# Patient Record
Sex: Female | Born: 1940 | Race: White | Hispanic: No | Marital: Married | Smoking: Current every day smoker
Health system: Southern US, Community
[De-identification: ages and names within clinical notes are randomized; demographics above are authoritative.]

## PROBLEM LIST (undated history)

## (undated) DIAGNOSIS — I10 Essential (primary) hypertension: Secondary | ICD-10-CM

---

## 2013-05-19 ENCOUNTER — Emergency Department: Payer: Self-pay | Admitting: Emergency Medicine

## 2013-05-19 LAB — CBC
HCT: 44.6 % (ref 35.0–47.0)
HGB: 15 g/dL (ref 12.0–16.0)
MCH: 30 pg (ref 26.0–34.0)
MCHC: 33.7 g/dL (ref 32.0–36.0)
MCV: 89 fL (ref 80–100)
Platelet: 459 10*3/uL — ABNORMAL HIGH (ref 150–440)
RBC: 5.01 10*6/uL (ref 3.80–5.20)
RDW: 13.8 % (ref 11.5–14.5)
WBC: 16.7 10*3/uL — ABNORMAL HIGH (ref 3.6–11.0)

## 2013-05-19 LAB — COMPREHENSIVE METABOLIC PANEL
AST: 21 U/L (ref 15–37)
Albumin: 3.5 g/dL (ref 3.4–5.0)
Alkaline Phosphatase: 117 U/L
Anion Gap: 9 (ref 7–16)
BILIRUBIN TOTAL: 0.4 mg/dL (ref 0.2–1.0)
BUN: 14 mg/dL (ref 7–18)
CALCIUM: 9.3 mg/dL (ref 8.5–10.1)
CREATININE: 0.96 mg/dL (ref 0.60–1.30)
Chloride: 96 mmol/L — ABNORMAL LOW (ref 98–107)
Co2: 29 mmol/L (ref 21–32)
EGFR (African American): >60
EGFR (Non-African Amer.): 60 — ABNORMAL LOW
Glucose: 153 mg/dL — ABNORMAL HIGH (ref 65–99)
OSMOLALITY: 272 (ref 275–301)
Potassium: 2.9 mmol/L — ABNORMAL LOW (ref 3.5–5.1)
SGPT (ALT): 21 U/L (ref 12–78)
Sodium: 134 mmol/L — ABNORMAL LOW (ref 136–145)
TOTAL PROTEIN: 8.2 g/dL (ref 6.4–8.2)

## 2013-05-19 LAB — DIFFERENTIAL
BASOS PCT: 0.9 %
Basophil #: 0.1 10*3/uL (ref 0.0–0.1)
EOS ABS: 0 10*3/uL (ref 0.0–0.7)
Eosinophil %: 0.3 %
LYMPHS PCT: 8.6 %
Lymphocyte #: 1.4 10*3/uL (ref 1.0–3.6)
Monocyte #: 1.3 x10 3/mm — ABNORMAL HIGH (ref 0.2–0.9)
Monocyte %: 7.5 %
NEUTROS ABS: 13.8 10*3/uL — AB (ref 1.4–6.5)
Neutrophil %: 82.7 %

## 2013-05-19 LAB — URINALYSIS, COMPLETE
BILIRUBIN, UR: NEGATIVE
Glucose,UR: NEGATIVE mg/dL (ref 0–75)
Hyaline Cast: 1
KETONE: NEGATIVE
Nitrite: NEGATIVE
Ph: 6 (ref 4.5–8.0)
Protein: 30
RBC,UR: 4 /HPF (ref 0–5)
Specific Gravity: 1.015 (ref 1.003–1.030)
Squamous Epithelial: 5

## 2013-05-19 LAB — TROPONIN I: Troponin-I: <0.02 ng/mL

## 2013-05-21 LAB — URINE CULTURE

## 2019-05-06 ENCOUNTER — Emergency Department: Payer: Medicare PPO

## 2019-05-06 ENCOUNTER — Other Ambulatory Visit: Payer: Self-pay

## 2019-05-06 ENCOUNTER — Inpatient Hospital Stay
Admission: EM | Admit: 2019-05-06 | Discharge: 2019-05-11 | DRG: 481 | Disposition: A | Discharge status: Skilled Nursing Facility | Payer: Medicare PPO | Attending: Internal Medicine | Admitting: Internal Medicine

## 2019-05-06 DIAGNOSIS — F17210 Nicotine dependence, cigarettes, uncomplicated: Secondary | ICD-10-CM | POA: Diagnosis present

## 2019-05-06 DIAGNOSIS — J449 Chronic obstructive pulmonary disease, unspecified: Secondary | ICD-10-CM | POA: Diagnosis present

## 2019-05-06 DIAGNOSIS — Z7951 Long term (current) use of inhaled steroids: Secondary | ICD-10-CM | POA: Diagnosis not present

## 2019-05-06 DIAGNOSIS — Z87440 Personal history of urinary (tract) infections: Secondary | ICD-10-CM | POA: Diagnosis not present

## 2019-05-06 DIAGNOSIS — R1310 Dysphagia, unspecified: Secondary | ICD-10-CM | POA: Diagnosis not present

## 2019-05-06 DIAGNOSIS — I712 Thoracic aortic aneurysm, without rupture: Secondary | ICD-10-CM | POA: Diagnosis present

## 2019-05-06 DIAGNOSIS — N179 Acute kidney failure, unspecified: Secondary | ICD-10-CM | POA: Diagnosis present

## 2019-05-06 DIAGNOSIS — I25118 Atherosclerotic heart disease of native coronary artery with other forms of angina pectoris: Secondary | ICD-10-CM | POA: Diagnosis not present

## 2019-05-06 DIAGNOSIS — I9581 Postprocedural hypotension: Secondary | ICD-10-CM | POA: Diagnosis not present

## 2019-05-06 DIAGNOSIS — W19XXXA Unspecified fall, initial encounter: Secondary | ICD-10-CM | POA: Diagnosis not present

## 2019-05-06 DIAGNOSIS — Z7982 Long term (current) use of aspirin: Secondary | ICD-10-CM | POA: Diagnosis not present

## 2019-05-06 DIAGNOSIS — Z9181 History of falling: Secondary | ICD-10-CM

## 2019-05-06 DIAGNOSIS — Z8781 Personal history of (healed) traumatic fracture: Secondary | ICD-10-CM

## 2019-05-06 DIAGNOSIS — Z20822 Contact with and (suspected) exposure to covid-19: Secondary | ICD-10-CM | POA: Diagnosis present

## 2019-05-06 DIAGNOSIS — Z79899 Other long term (current) drug therapy: Secondary | ICD-10-CM

## 2019-05-06 DIAGNOSIS — D62 Acute posthemorrhagic anemia: Secondary | ICD-10-CM | POA: Diagnosis not present

## 2019-05-06 DIAGNOSIS — Z9981 Dependence on supplemental oxygen: Secondary | ICD-10-CM | POA: Diagnosis not present

## 2019-05-06 DIAGNOSIS — S7221XD Displaced subtrochanteric fracture of right femur, subsequent encounter for closed fracture with routine healing: Secondary | ICD-10-CM | POA: Diagnosis not present

## 2019-05-06 DIAGNOSIS — K219 Gastro-esophageal reflux disease without esophagitis: Secondary | ICD-10-CM | POA: Diagnosis present

## 2019-05-06 DIAGNOSIS — I714 Abdominal aortic aneurysm, without rupture: Secondary | ICD-10-CM | POA: Diagnosis present

## 2019-05-06 DIAGNOSIS — I7123 Aneurysm of the descending thoracic aorta, without rupture: Secondary | ICD-10-CM

## 2019-05-06 DIAGNOSIS — S72001A Fracture of unspecified part of neck of right femur, initial encounter for closed fracture: Secondary | ICD-10-CM | POA: Diagnosis not present

## 2019-05-06 DIAGNOSIS — R1314 Dysphagia, pharyngoesophageal phase: Secondary | ICD-10-CM | POA: Diagnosis present

## 2019-05-06 DIAGNOSIS — R1319 Other dysphagia: Secondary | ICD-10-CM

## 2019-05-06 DIAGNOSIS — I10 Essential (primary) hypertension: Secondary | ICD-10-CM | POA: Diagnosis present

## 2019-05-06 DIAGNOSIS — Z419 Encounter for procedure for purposes other than remedying health state, unspecified: Secondary | ICD-10-CM

## 2019-05-06 DIAGNOSIS — Z01811 Encounter for preprocedural respiratory examination: Secondary | ICD-10-CM | POA: Diagnosis not present

## 2019-05-06 DIAGNOSIS — M25551 Pain in right hip: Secondary | ICD-10-CM | POA: Diagnosis present

## 2019-05-06 DIAGNOSIS — Z720 Tobacco use: Secondary | ICD-10-CM | POA: Diagnosis not present

## 2019-05-06 DIAGNOSIS — I77810 Thoracic aortic ectasia: Secondary | ICD-10-CM | POA: Diagnosis not present

## 2019-05-06 DIAGNOSIS — S7221XA Displaced subtrochanteric fracture of right femur, initial encounter for closed fracture: Secondary | ICD-10-CM | POA: Diagnosis present

## 2019-05-06 DIAGNOSIS — R079 Chest pain, unspecified: Secondary | ICD-10-CM | POA: Diagnosis not present

## 2019-05-06 DIAGNOSIS — W010XXA Fall on same level from slipping, tripping and stumbling without subsequent striking against object, initial encounter: Secondary | ICD-10-CM | POA: Diagnosis present

## 2019-05-06 DIAGNOSIS — Y92002 Bathroom of unspecified non-institutional (private) residence single-family (private) house as the place of occurrence of the external cause: Secondary | ICD-10-CM

## 2019-05-06 DIAGNOSIS — J432 Centrilobular emphysema: Secondary | ICD-10-CM | POA: Diagnosis not present

## 2019-05-06 DIAGNOSIS — D72829 Elevated white blood cell count, unspecified: Secondary | ICD-10-CM | POA: Diagnosis present

## 2019-05-06 DIAGNOSIS — E876 Hypokalemia: Secondary | ICD-10-CM | POA: Diagnosis present

## 2019-05-06 HISTORY — DX: Essential (primary) hypertension: I10

## 2019-05-06 LAB — BASIC METABOLIC PANEL
Anion gap: 15 (ref 5–15)
BUN: 15 mg/dL (ref 8–23)
CO2: 24 mmol/L (ref 22–32)
Calcium: 9.8 mg/dL (ref 8.9–10.3)
Chloride: 99 mmol/L (ref 98–111)
Creatinine, Ser: 1.18 mg/dL — ABNORMAL HIGH (ref 0.44–1.00)
GFR calc Af Amer: 52 mL/min — ABNORMAL LOW (ref >60)
GFR calc non Af Amer: 45 mL/min — ABNORMAL LOW (ref >60)
Glucose, Bld: 136 mg/dL — ABNORMAL HIGH (ref 70–99)
Potassium: 3.3 mmol/L — ABNORMAL LOW (ref 3.5–5.1)
Sodium: 138 mmol/L (ref 135–145)

## 2019-05-06 LAB — CBC WITH DIFFERENTIAL/PLATELET
Abs Immature Granulocytes: 0.2 10*3/uL — ABNORMAL HIGH (ref 0.00–0.07)
Basophils Absolute: 0.1 10*3/uL (ref 0.0–0.1)
Basophils Relative: 1 %
Eosinophils Absolute: 0.1 10*3/uL (ref 0.0–0.5)
Eosinophils Relative: 1 %
HCT: 36.5 % (ref 36.0–46.0)
Hemoglobin: 12.3 g/dL (ref 12.0–15.0)
Immature Granulocytes: 2 %
Lymphocytes Relative: 10 %
Lymphs Abs: 1.4 10*3/uL (ref 0.7–4.0)
MCH: 30.1 pg (ref 26.0–34.0)
MCHC: 33.7 g/dL (ref 30.0–36.0)
MCV: 89.2 fL (ref 80.0–100.0)
Monocytes Absolute: 1 10*3/uL (ref 0.1–1.0)
Monocytes Relative: 8 %
Neutro Abs: 10.3 10*3/uL — ABNORMAL HIGH (ref 1.7–7.7)
Neutrophils Relative %: 78 %
Platelets: 452 10*3/uL — ABNORMAL HIGH (ref 150–400)
RBC: 4.09 MIL/uL (ref 3.87–5.11)
RDW: 13.3 % (ref 11.5–15.5)
WBC: 13 10*3/uL — ABNORMAL HIGH (ref 4.0–10.5)
nRBC: 0 % (ref 0.0–0.2)

## 2019-05-06 LAB — RESPIRATORY PANEL BY RT PCR (FLU A&B, COVID)
Influenza A by PCR: NEGATIVE
Influenza B by PCR: NEGATIVE
SARS Coronavirus 2 by RT PCR: NEGATIVE

## 2019-05-06 MED ORDER — ALBUTEROL SULFATE HFA 108 (90 BASE) MCG/ACT IN AERS
2.0000 | INHALATION_SPRAY | Freq: Four times a day (QID) | RESPIRATORY_TRACT | Status: DC | PRN
  Filled 2019-05-06: qty 6.7

## 2019-05-06 MED ORDER — B COMPLEX-C PO TABS
1.0000 | ORAL_TABLET | Freq: Every day | ORAL | Status: DC
  Administered 2019-05-08 – 2019-05-11 (×4): 1 via ORAL
  Filled 2019-05-06 (×5): qty 1

## 2019-05-06 MED ORDER — HYDROMORPHONE HCL 1 MG/ML IJ SOLN
1.0000 mg | Freq: Once | INTRAMUSCULAR | Status: AC
  Administered 2019-05-06: 1 mg via INTRAVENOUS
  Filled 2019-05-06: qty 1

## 2019-05-06 MED ORDER — NITROFURANTOIN MACROCRYSTAL 50 MG PO CAPS
50.0000 mg | ORAL_CAPSULE | Freq: Every day | ORAL | Status: DC
  Administered 2019-05-07: 50 mg via ORAL
  Filled 2019-05-06: qty 1

## 2019-05-06 MED ORDER — PROPRANOLOL HCL 20 MG PO TABS
40.0000 mg | ORAL_TABLET | Freq: Three times a day (TID) | ORAL | Status: DC
  Administered 2019-05-07 – 2019-05-08 (×3): 40 mg via ORAL
  Filled 2019-05-06 (×4): qty 2

## 2019-05-06 MED ORDER — ONDANSETRON HCL 4 MG PO TABS: 4.0000 mg | ORAL_TABLET | Freq: Four times a day (QID) | ORAL | Status: DC | PRN

## 2019-05-06 MED ORDER — MOMETASONE FURO-FORMOTEROL FUM 200-5 MCG/ACT IN AERO
2.0000 | INHALATION_SPRAY | Freq: Two times a day (BID) | RESPIRATORY_TRACT | Status: DC
  Administered 2019-05-07 – 2019-05-11 (×8): 2 via RESPIRATORY_TRACT
  Filled 2019-05-06: qty 8.8

## 2019-05-06 MED ORDER — ONDANSETRON HCL 4 MG/2ML IJ SOLN: 4.0000 mg | Freq: Four times a day (QID) | INTRAMUSCULAR | Status: DC | PRN

## 2019-05-06 MED ORDER — ONDANSETRON HCL 4 MG/2ML IJ SOLN
4.0000 mg | Freq: Once | INTRAMUSCULAR | Status: AC
  Administered 2019-05-06: 4 mg via INTRAVENOUS
  Filled 2019-05-06: qty 2

## 2019-05-06 MED ORDER — ROSUVASTATIN CALCIUM 10 MG PO TABS
10.0000 mg | ORAL_TABLET | Freq: Every day | ORAL | Status: DC
  Administered 2019-05-07 – 2019-05-10 (×4): 10 mg via ORAL
  Filled 2019-05-06 (×4): qty 1

## 2019-05-06 MED ORDER — MORPHINE SULFATE (PF) 4 MG/ML IV SOLN
4.0000 mg | Freq: Once | INTRAVENOUS | Status: AC
  Administered 2019-05-06: 4 mg via INTRAVENOUS
  Filled 2019-05-06: qty 1

## 2019-05-06 MED ORDER — AMLODIPINE BESYLATE 5 MG PO TABS
5.0000 mg | ORAL_TABLET | Freq: Every day | ORAL | Status: DC
  Filled 2019-05-06: qty 1

## 2019-05-06 MED ORDER — CALCIUM CARBONATE-VITAMIN D 500-200 MG-UNIT PO TABS
1.0000 | ORAL_TABLET | Freq: Every day | ORAL | Status: DC
  Administered 2019-05-08 – 2019-05-11 (×4): 1 via ORAL
  Filled 2019-05-06 (×4): qty 1

## 2019-05-06 MED ORDER — SENNOSIDES-DOCUSATE SODIUM 8.6-50 MG PO TABS: 1.0000 | ORAL_TABLET | Freq: Every evening | ORAL | Status: DC | PRN

## 2019-05-06 MED ORDER — CALCIUM ACETATE (PHOS BINDER) 667 MG/5ML PO SOLN
667.0000 mg | Freq: Two times a day (BID) | ORAL | Status: DC
  Administered 2019-05-07 – 2019-05-11 (×8): 667 mg via ORAL
  Filled 2019-05-06 (×11): qty 5

## 2019-05-06 MED ORDER — MORPHINE SULFATE (PF) 2 MG/ML IV SOLN
0.5000 mg | INTRAVENOUS | Status: DC | PRN
  Administered 2019-05-07 (×2): 0.5 mg via INTRAVENOUS
  Filled 2019-05-06 (×3): qty 1

## 2019-05-06 MED ORDER — CLINDAMYCIN PHOSPHATE 600 MG/50ML IV SOLN
600.0000 mg | Freq: Once | INTRAVENOUS | Status: AC
  Administered 2019-05-07: 600 mg via INTRAVENOUS
  Filled 2019-05-06 (×2): qty 50

## 2019-05-06 MED ORDER — HYDROMORPHONE HCL 1 MG/ML IJ SOLN
1.0000 mg | Freq: Once | INTRAMUSCULAR | Status: AC
  Administered 2019-05-06: 22:00:00 1 mg via INTRAVENOUS
  Filled 2019-05-06: qty 1

## 2019-05-06 MED ORDER — ASPIRIN EC 81 MG PO TBEC
81.0000 mg | DELAYED_RELEASE_TABLET | Freq: Every day | ORAL | Status: DC
  Administered 2019-05-07 – 2019-05-10 (×4): 81 mg via ORAL
  Filled 2019-05-06 (×4): qty 1

## 2019-05-06 MED ORDER — NICOTINE 14 MG/24HR TD PT24
14.0000 mg | MEDICATED_PATCH | Freq: Every day | TRANSDERMAL | Status: DC
  Filled 2019-05-06 (×6): qty 1

## 2019-05-06 MED ORDER — TIOTROPIUM BROMIDE MONOHYDRATE 18 MCG IN CAPS
1.0000 | ORAL_CAPSULE | Freq: Every day | RESPIRATORY_TRACT | Status: DC
  Administered 2019-05-08 – 2019-05-10 (×3): 18 ug via RESPIRATORY_TRACT
  Filled 2019-05-06 (×2): qty 5

## 2019-05-06 MED ORDER — HYDROCODONE-ACETAMINOPHEN 5-325 MG PO TABS
1.0000 | ORAL_TABLET | Freq: Four times a day (QID) | ORAL | Status: DC | PRN
  Administered 2019-05-07: 04:00:00 1 via ORAL
  Administered 2019-05-08: 22:00:00 2 via ORAL
  Administered 2019-05-09: 1 via ORAL
  Administered 2019-05-10: 2 via ORAL
  Administered 2019-05-10: 1 via ORAL
  Administered 2019-05-10: 14:00:00 2 via ORAL
  Administered 2019-05-11: 1 via ORAL
  Filled 2019-05-06 (×2): qty 2
  Filled 2019-05-06: qty 1
  Filled 2019-05-06: qty 2
  Filled 2019-05-06: qty 1
  Filled 2019-05-06: qty 2
  Filled 2019-05-06: qty 1

## 2019-05-06 MED ORDER — PANTOPRAZOLE SODIUM 40 MG PO TBEC
40.0000 mg | DELAYED_RELEASE_TABLET | Freq: Every day | ORAL | Status: DC
  Administered 2019-05-08 – 2019-05-11 (×4): 40 mg via ORAL
  Filled 2019-05-06 (×4): qty 1

## 2019-05-06 MED ORDER — VITAMIN D 25 MCG (1000 UNIT) PO TABS
1000.0000 [IU] | ORAL_TABLET | Freq: Every day | ORAL | Status: DC
  Administered 2019-05-08 – 2019-05-11 (×4): 1000 [IU] via ORAL
  Filled 2019-05-06 (×3): qty 1

## 2019-05-06 NOTE — ED Notes (Signed)
Pt taken to xray 

## 2019-05-06 NOTE — ED Notes (Signed)
Pt placed on 2L Falls due to O2 saturations dropping to 83% on room air with good wave form. Pt now satting at 100%.

## 2019-05-06 NOTE — ED Notes (Signed)
Katie, rn called to notify that pt's sister wishes to speak with MD.

## 2019-05-06 NOTE — ED Notes (Signed)
Pt's sister up to front desk, states pt called her and wants to be transferred to [facility]. Pt already has a visitor in room, her husband. Number taken for pt's sister and given to dr. Derrill Kay. Dr. Derrill Kay informed that pt's sister stated she wants to be transferred to [facility].

## 2019-05-06 NOTE — ED Provider Notes (Addendum)
[facility] Emergency Department Provider Note   ____________________________________________   I have reviewed the triage vital signs and the nursing notes.   HISTORY  Chief Complaint Fall and Hip Pain   History limited by: Not Limited   HPI Holly Shelton is a 79 y.o. female who presents to the emergency department today because of concerns for right hip pain after a fall.  The patient states that she was going to the bathroom and her leg went out.  When she at the ground she felt a pop and immediate onset of pain to the right hip.  She denies any in her head or any pain to her other extremities.  She states that the pain is severe and it radiates down her leg.  She denies any numbness to that leg.   Records reviewed. Per medical record review patient has a history of HTN.  Past Medical History:  Diagnosis Date  . Hypertension     There are no problems to display for this patient.   History reviewed. No pertinent surgical history.  Prior to Admission medications   Medication Sig Start Date End Date Taking? Authorizing Provider  albuterol (VENTOLIN HFA) 108 (90 Base) MCG/ACT inhaler Inhale 2 puffs into the lungs every 6 (six) hours as needed.  04/10/19  Yes [provider]  amLODipine (NORVASC) 5 MG tablet Take 5 mg by mouth daily. 04/25/19  Yes [provider]  aspirin EC 81 MG tablet Take 81 mg by mouth at bedtime.   Yes [provider]  B Complex-C (B-COMPLEX WITH VITAMIN C) tablet Take 1 tablet by mouth daily.   Yes [provider]  Calcium 600-200 MG-UNIT tablet Take 1 tablet by mouth daily.   Yes [provider]  calcium acetate, Phos Binder, (PHOSLYRA) 667 MG/5ML SOLN Take 667 mg by mouth in the morning and at bedtime.   Yes [provider]  cholecalciferol (VITAMIN D3) 25 MCG (1000 UNIT) tablet Take 1,000 Units by mouth daily.   Yes [provider]  gabapentin (NEURONTIN) 600 MG  tablet Take 600 mg by mouth See admin instructions. Take 600 mg at 8:00 am, 1200 mg at 2:00, 600 mg at 7:00 pm, 1200 mg at 2:00 am   Yes [provider]  loratadine (CLARITIN) 10 MG tablet Take 10 mg by mouth daily.   Yes [provider]  nitrofurantoin (MACRODANTIN) 50 MG capsule Take 50 mg by mouth at bedtime.  02/13/19  Yes [provider]  omeprazole (PRILOSEC) 40 MG capsule Take 40 mg by mouth daily. 04/12/19  Yes [provider]  oxybutynin (DITROPAN) 5 MG tablet Take 5 mg by mouth 3 (three) times daily. 04/10/19  Yes [provider]  pregabalin (LYRICA) 100 MG capsule Take 100 mg by mouth 3 (three) times daily. 04/25/19  Yes [provider]  propranolol (INDERAL) 40 MG tablet Take 40 mg by mouth 3 (three) times daily. 03/22/19  Yes [provider]  rosuvastatin (CRESTOR) 10 MG tablet Take 10 mg by mouth at bedtime. 04/12/19  Yes [provider]  SPIRIVA HANDIHALER 18 MCG inhalation capsule Place 1 capsule into inhaler and inhale daily. 04/25/19  Yes [provider]  Monte Fantasia INHUB 500-50 MCG/DOSE AEPB Inhale 1 puff into the lungs 2 (two) times daily. 05/03/19  Yes [provider]    Allergies Penicillins  History reviewed. No pertinent family history.  Social History Social History   Tobacco Use  . Smoking status: Current Every Day Smoker  .  Smokeless tobacco: Never Used  Substance Use Topics  . Alcohol use: Never  . Drug use: Never    Review of Systems Constitutional: No fever/chills Eyes: No visual changes. ENT: No sore throat. Cardiovascular: Denies chest pain. Respiratory: Denies shortness of breath. Gastrointestinal: No abdominal pain.  No nausea, no vomiting.  No diarrhea.   Genitourinary: Negative for dysuria. Musculoskeletal: Positive for right hip pain. Skin: Negative for rash. Neurological: Negative for headaches, focal weakness or  numbness.  ____________________________________________   PHYSICAL EXAM:  VITAL SIGNS: ED Triage Vitals  Enc Vitals Group     BP 05/06/19 1800 (!) 131/99     Pulse Rate 05/06/19 1800 92     Resp 05/06/19 1807 18     Temp 05/06/19 1807 98 F (36.7 C)     Temp Source 05/06/19 1807 Oral     SpO2 05/06/19 1800 96 %     Weight 05/06/19 1806 147 lb 4.3 oz (66.8 kg)     Height 05/06/19 1806 5\' 3"  (1.6 m)     Head Circumference --      Peak Flow --      Pain Score 05/06/19 1803 10   Constitutional: Alert and oriented.  Eyes: Conjunctivae are normal.  ENT      Head: Normocephalic and atraumatic.      Nose: No congestion/rhinnorhea.      Mouth/Throat: Mucous membranes are moist.      Neck: No stridor. Hematological/Lymphatic/Immunilogical: No cervical lymphadenopathy. Cardiovascular: Normal rate, regular rhythm.  No murmurs, rubs, or gallops. Respiratory: Normal respiratory effort without tachypnea nor retractions. Breath sounds are clear and equal bilaterally. No wheezes/rales/rhonchi. Gastrointestinal: Soft and non tender. No rebound. No guarding.  Genitourinary: Deferred Musculoskeletal: Right leg shortened, internally rotated. DP 2+ bilaterally. Right hip tender to palpation and manipulation. Neurologic:  Normal speech and language. No gross focal neurologic deficits are appreciated.  Skin:  Skin is warm, dry and intact. No rash noted. Psychiatric: Mood and affect are normal. Speech and behavior are normal. Patient exhibits appropriate insight and judgment.  ____________________________________________    LABS (pertinent positives/negatives)  CBC wbc 13.0, hgb 12.3, plt 452 BMP na 138, k 3.3, glu 136, cr 1.18  ____________________________________________    RADIOLOGY  Right hip Acute subtrochanteric fracture  CXR No acute  abnormality  ____________________________________________   PROCEDURES  Procedures  ____________________________________________   INITIAL IMPRESSION / ASSESSMENT AND PLAN / ED COURSE  Pertinent labs & imaging results that were available during my care of the patient were reviewed by me and considered in my medical decision making (see chart for details).   Patient presented to the emergency department today with concerns for right hip pain after a fall.  X-rays consistent with a fracture.  Discussed finding with patient need for admission and surgery.  Discussed with Dr. Rudene Christians with orthopedic surgery. Will plan on admission to the hospitalist service  After the patient had been admitted to the hospitalist service here I heard from nursing staff that she wanted to be transferred to [facility]. When I went to go talk to her she mentioned that [facility] had her records. I did discuss that our electronic medical record can access [facility] record. At this point patient and husband stated they felt comfortable staying at our facility.  ____________________________________________   FINAL CLINICAL IMPRESSION(S) / ED DIAGNOSES  Right hip fracture  Note: This dictation was prepared with Dragon dictation. Any transcriptional errors that result from this process are unintentional     Nance Pear, MD  05/06/19 2104    Phineas Semen, MD 05/06/19 2243

## 2019-05-06 NOTE — H&P (Signed)
History and Physical    Holly Shelton EQA:[id number] DOB: 30-Jul-1940 DOA: 05/06/2019  PCP: Patient, No Pcp Per   Patient coming from: home I have personally briefly reviewed patient's old medical records in [facility]  Chief Complaint: fall  HPI: Holly Shelton is a 79 y.o. female with medical history significant for HTN, COPD, GERD, AAA thoracic descending aorta, recurrent UTIs on nitrofurantoin and tobacco use who presents to the emergency room with right hip pain following a fall.  Fall appears to be mechanical in that she was walking to the bathroom on her right leg gave out, she fell onto the right hip feeling a pop with immediate onset of severe pain.  Denies associated numbness or color change in the leg.  She denies preceding chest pain, shortness of breath, headache, blurred vision, unilateral numbness tingling or weakness.  ED Course: X-ray of the hip showed transverse displaced and impacted right subtrochanteric fracture.  Blood work mostly unremarkable except for creatinine of 1.18 but no recent creatinine on file.  Normal white cell count.  Urinalysis pending.  EKG pending.  Chest x-ray with no acute findings.  The ER provider spoke with Dr. Trilby Drummer who will put patient on the schedule for tomorrow.  Review of Systems: As per HPI otherwise 10 point review of systems negative.    Past Medical History:  Diagnosis Date  . Hypertension     History reviewed. No pertinent surgical history.   reports that she has been smoking. She has never used smokeless tobacco. She reports that she does not drink alcohol or use drugs.  Allergies  Allergen Reactions  . Penicillins Rash    History reviewed. No pertinent family history.   Prior to Admission medications   Medication Sig Start Date End Date Taking? Authorizing Provider  albuterol (VENTOLIN HFA) 108 (90 Base) MCG/ACT inhaler Inhale 2 puffs into the lungs every 6 (six) hours as needed.  04/10/19  Yes [provider]    amLODipine (NORVASC) 5 MG tablet Take 5 mg by mouth daily. 04/25/19  Yes [provider]  aspirin EC 81 MG tablet Take 81 mg by mouth at bedtime.   Yes [provider]  B Complex-C (B-COMPLEX WITH VITAMIN C) tablet Take 1 tablet by mouth daily.   Yes [provider]  Calcium 600-200 MG-UNIT tablet Take 1 tablet by mouth daily.   Yes [provider]  calcium acetate, Phos Binder, (PHOSLYRA) 667 MG/5ML SOLN Take 667 mg by mouth in the morning and at bedtime.   Yes [provider]  cholecalciferol (VITAMIN D3) 25 MCG (1000 UNIT) tablet Take 1,000 Units by mouth daily.   Yes [provider]  gabapentin (NEURONTIN) 600 MG tablet Take 600 mg by mouth See admin instructions. Take 600 mg at 8:00 am, 1200 mg at 2:00, 600 mg at 7:00 pm, 1200 mg at 2:00 am   Yes [provider]  loratadine (CLARITIN) 10 MG tablet Take 10 mg by mouth daily.   Yes [provider]  nitrofurantoin (MACRODANTIN) 50 MG capsule Take 50 mg by mouth at bedtime.  02/13/19  Yes [provider]  omeprazole (PRILOSEC) 40 MG capsule Take 40 mg by mouth daily. 04/12/19  Yes [provider]  oxybutynin (DITROPAN) 5 MG tablet Take 5 mg by mouth 3 (three) times daily. 04/10/19  Yes [provider]  pregabalin (LYRICA) 100 MG capsule Take 100 mg by mouth 3 (three) times daily. 04/25/19  Yes [provider]  propranolol (INDERAL) 40  MG tablet Take 40 mg by mouth 3 (three) times daily. 03/22/19  Yes [provider]  rosuvastatin (CRESTOR) 10 MG tablet Take 10 mg by mouth at bedtime. 04/12/19  Yes [provider]  SPIRIVA HANDIHALER 18 MCG inhalation capsule Place 1 capsule into inhaler and inhale daily. 04/25/19  Yes [provider]  Monte Fantasia INHUB 500-50 MCG/DOSE AEPB Inhale 1 puff into the lungs 2 (two) times daily. 05/03/19  Yes [provider]    Physical Exam: Vitals:   05/06/19 1800 05/06/19 1806 05/06/19  1807  BP: (!) 131/99  (!) 131/99  Pulse: 92  95  Resp:   18  Temp:   98 F (36.7 C)  TempSrc:   Oral  SpO2: 96%  96%  Weight:  66.8 kg   Height:  5\' 3"  (1.6 m)      Vitals:   05/06/19 1800 05/06/19 1806 05/06/19 1807  BP: (!) 131/99  (!) 131/99  Pulse: 92  95  Resp:   18  Temp:   98 F (36.7 C)  TempSrc:   Oral  SpO2: 96%  96%  Weight:  66.8 kg   Height:  5\' 3"  (1.6 m)     Constitutional: Alert and awake, oriented x3, in some pain discomfort Eyes: PERLA, EOMI, irises appear normal, anicteric sclera,  ENMT: external ears and nose appear normal, normal hearing             Lips appears normal, oropharynx mucosa, tongue, posterior pharynx appear normal  Neck: neck appears normal, no masses, normal ROM, no thyromegaly, no JVD  CVS: S1-S2 clear, no murmur rubs or gallops,  , no carotid bruits, pedal pulses palpable, No LE edema Respiratory:  clear to auscultation bilaterally, no wheezing, rales or rhonchi. Respiratory effort normal. No accessory muscle use.  Abdomen: soft nontender, nondistended, normal bowel sounds, no hepatosplenomegaly, no hernias Musculoskeletal: : no cyanosis, clubbing , flexion abduction and external rotation of right hip Neuro: Cranial nerves II-XII intact, sensation, reflexes normal, strength Psych: judgement and insight appear normal, stable mood and affect,  Skin: no rashes or lesions or ulcers, no induration or nodules   Labs on Admission: I have personally reviewed following labs and imaging studies  CBC: Recent Labs  Lab 05/06/19 1808  WBC 13.0*  NEUTROABS 10.3*  HGB 12.3  HCT 36.5  MCV 89.2  PLT 452*   Basic Metabolic Panel: Recent Labs  Lab 05/06/19 1808  NA 138  K 3.3*  CL 99  CO2 24  GLUCOSE 136*  BUN 15  CREATININE 1.18*  CALCIUM 9.8   GFR: Estimated Creatinine Clearance: 37.3 mL/min (A) (by C-G formula based on SCr of 1.18 mg/dL (H)). Liver Function Tests: No results for input(s): AST, ALT, ALKPHOS, BILITOT, PROT,  ALBUMIN in the last 168 hours. No results for input(s): LIPASE, AMYLASE in the last 168 hours. No results for input(s): AMMONIA in the last 168 hours. Coagulation Profile: No results for input(s): INR, PROTIME in the last 168 hours. Cardiac Enzymes: No results for input(s): CKTOTAL, CKMB, CKMBINDEX, TROPONINI in the last 168 hours. BNP (last 3 results) No results for input(s): PROBNP in the last 8760 hours. HbA1C: No results for input(s): HGBA1C in the last 72 hours. CBG: No results for input(s): GLUCAP in the last 168 hours. Lipid Profile: No results for input(s): CHOL, HDL, LDLCALC, TRIG, CHOLHDL, LDLDIRECT in the last 72 hours. Thyroid Function Tests: No results for input(s): TSH, T4TOTAL, FREET4, T3FREE, THYROIDAB in the last 72 hours. Anemia Panel:  No results for input(s): VITAMINB12, FOLATE, FERRITIN, TIBC, IRON, RETICCTPCT in the last 72 hours. Urine analysis:    Component Value Date/Time   COLORURINE Amber 05/19/2013 1200   APPEARANCEUR Cloudy 05/19/2013 1200   LABSPEC 1.015 05/19/2013 1200   PHURINE 6.0 05/19/2013 1200   GLUCOSEU Negative 05/19/2013 1200   HGBUR 1+ 05/19/2013 1200   BILIRUBINUR Negative 05/19/2013 1200   KETONESUR Negative 05/19/2013 1200   PROTEINUR 30 mg/dL 05/19/2013 1200   NITRITE Negative 05/19/2013 1200   LEUKOCYTESUR 2+ 05/19/2013 1200    Radiological Exams on Admission: DG Chest 1 View  Result Date: 05/06/2019 CLINICAL DATA:  Recent fall with chest pain, initial encounter EXAM: CHEST  1 VIEW COMPARISON:  05/19/2013 FINDINGS: Cardiac shadow is enlarged. Thoracic aorta is tortuous and accentuated by mild patient rotation. The lungs are clear bilaterally. No acute bony abnormality is seen. IMPRESSION: Cardiomegaly and tortuous aorta.  No acute abnormality is seen. Electronically Signed   By: Inez Catalina M.D.   On: 05/06/2019 19:03   DG Hip Unilat W or Wo Pelvis 2-3 Views Right  Result Date: 05/06/2019 CLINICAL DATA:  Right hip pain post fall.  EXAM: DG HIP (WITH OR WITHOUT PELVIS) 2-3V RIGHT COMPARISON:  None. FINDINGS: There is a transverse severely displaced and impacted subtrochanteric fracture of the proximal right femoral shaft. The right femoral head is normally located within the acetabulum. IMPRESSION: Transverse severely displaced and impacted subtrochanteric fracture of the proximal right femoral shaft. Electronically Signed   By: Fidela Salisbury M.D.   On: 05/06/2019 19:06    EKG: pending  Assessment/Plan Active Problems:   Closed displaced subtrochanteric fracture of right femur (HCC) Preoperative clearance -Mechanical fall resulting in closed displaced and impacted right subtrochanteric fracture -Management per orthopedics -Pain control -Low risk of perioperative cardiopulmonary events pending unremarkable EKG..  No documented history of CAD, CHF, OSA, stroke, syncope, cardiac arrhythmias.  Does have COPD but has fair exercise tolerance.Has a stable AAA. -Okay to proceed with procedure scheduled for more --Husband at bedside says they were once told she should not receive general anesthesia.  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    Tobacco use -Nicotine patch    History of recurrent UTI (urinary tract infection) -Continue home nitrofurantoin    GERD (gastroesophageal reflux disease) -Continue PPI     DVT prophylaxis: SCD  Code Status: full code  Family Communication:  Husband at bedside  Disposition Plan: Back to previous home environment Consults called: ortho, Dr. Rudene Christians  Status:inp    Athena Masse MD [facility]     05/06/2019, 8:03 PM

## 2019-05-06 NOTE — ED Triage Notes (Addendum)
Pt here via [facility] from home after falling. Per EMS pt fell on right hip when ambulating with a cane today, pt states she felt it "pop". Right leg is internally rotated, 2+ pedal pulses.   Pt received fentanyl in route, c/o 9/10 right sided hip pain at this time.   Per pt's family, pt has also been losing weight over the last few months and has not had an appetite. Family states pt hasn't seen a doctor for these issues.  EMS VS- BP 128/74, HR 100, cbg 148, O2 98% RA. Pt A&Ox4.

## 2019-05-06 NOTE — ED Notes (Signed)
Pt's sister back up to desk, states "no one has called me"sister informed that this rn relayed message and her phone number to dr. Derrill Kay.

## 2019-05-07 ENCOUNTER — Encounter
Admission: EM | Disposition: A | Discharge status: Skilled Nursing Facility | Payer: Self-pay | Source: Home / Self Care | Attending: Internal Medicine

## 2019-05-07 ENCOUNTER — Encounter: Payer: Self-pay | Admitting: Internal Medicine

## 2019-05-07 ENCOUNTER — Inpatient Hospital Stay: Payer: Medicare PPO | Admitting: Anesthesiology

## 2019-05-07 ENCOUNTER — Inpatient Hospital Stay: Payer: Medicare PPO

## 2019-05-07 DIAGNOSIS — I714 Abdominal aortic aneurysm, without rupture: Secondary | ICD-10-CM

## 2019-05-07 DIAGNOSIS — I77810 Thoracic aortic ectasia: Secondary | ICD-10-CM

## 2019-05-07 DIAGNOSIS — R079 Chest pain, unspecified: Secondary | ICD-10-CM

## 2019-05-07 DIAGNOSIS — S7221XD Displaced subtrochanteric fracture of right femur, subsequent encounter for closed fracture with routine healing: Secondary | ICD-10-CM

## 2019-05-07 DIAGNOSIS — I25118 Atherosclerotic heart disease of native coronary artery with other forms of angina pectoris: Secondary | ICD-10-CM

## 2019-05-07 DIAGNOSIS — I712 Thoracic aortic aneurysm, without rupture: Secondary | ICD-10-CM

## 2019-05-07 HISTORY — PX: INTRAMEDULLARY (IM) NAIL INTERTROCHANTERIC: SHX5875

## 2019-05-07 LAB — BASIC METABOLIC PANEL
Anion gap: 15 (ref 5–15)
BUN: 19 mg/dL (ref 8–23)
CO2: 26 mmol/L (ref 22–32)
Calcium: 9.3 mg/dL (ref 8.9–10.3)
Chloride: 98 mmol/L (ref 98–111)
Creatinine, Ser: 1.44 mg/dL — ABNORMAL HIGH (ref 0.44–1.00)
GFR calc Af Amer: 41 mL/min — ABNORMAL LOW (ref >60)
GFR calc non Af Amer: 35 mL/min — ABNORMAL LOW (ref >60)
Glucose, Bld: 145 mg/dL — ABNORMAL HIGH (ref 70–99)
Potassium: 3.5 mmol/L (ref 3.5–5.1)
Sodium: 139 mmol/L (ref 135–145)

## 2019-05-07 LAB — CBC
HCT: 37 % (ref 36.0–46.0)
Hemoglobin: 12.2 g/dL (ref 12.0–15.0)
MCH: 30 pg (ref 26.0–34.0)
MCHC: 33 g/dL (ref 30.0–36.0)
MCV: 91.1 fL (ref 80.0–100.0)
Platelets: 409 10*3/uL — ABNORMAL HIGH (ref 150–400)
RBC: 4.06 MIL/uL (ref 3.87–5.11)
RDW: 13.5 % (ref 11.5–15.5)
WBC: 17.5 10*3/uL — ABNORMAL HIGH (ref 4.0–10.5)
nRBC: 0 % (ref 0.0–0.2)

## 2019-05-07 LAB — MRSA PCR SCREENING: MRSA by PCR: NEGATIVE

## 2019-05-07 LAB — TYPE AND SCREEN
ABO/RH(D): O NEG
Antibody Screen: NEGATIVE

## 2019-05-07 LAB — TROPONIN I (HIGH SENSITIVITY)
Troponin I (High Sensitivity): 20 ng/L — ABNORMAL HIGH (ref <18)
Troponin I (High Sensitivity): 21 ng/L — ABNORMAL HIGH (ref <18)

## 2019-05-07 SURGERY — FIXATION, FRACTURE, INTERTROCHANTERIC, WITH INTRAMEDULLARY ROD
Anesthesia: Spinal | Site: Hip | Laterality: Right

## 2019-05-07 MED ORDER — OXYCODONE HCL 5 MG/5ML PO SOLN: 5.0000 mg | Freq: Once | ORAL | Status: DC | PRN

## 2019-05-07 MED ORDER — ENOXAPARIN SODIUM 40 MG/0.4ML ~~LOC~~ SOLN: 40.0000 mg | SUBCUTANEOUS | Status: DC

## 2019-05-07 MED ORDER — ONDANSETRON HCL 4 MG/2ML IJ SOLN: 4.0000 mg | Freq: Four times a day (QID) | INTRAMUSCULAR | Status: DC | PRN

## 2019-05-07 MED ORDER — CHLORHEXIDINE GLUCONATE CLOTH 2 % EX PADS
6.0000 | MEDICATED_PAD | Freq: Every day | CUTANEOUS | Status: DC
  Administered 2019-05-09 – 2019-05-11 (×3): 6 via TOPICAL

## 2019-05-07 MED ORDER — ONDANSETRON HCL 4 MG PO TABS: 4.0000 mg | ORAL_TABLET | Freq: Four times a day (QID) | ORAL | Status: DC | PRN

## 2019-05-07 MED ORDER — METOCLOPRAMIDE HCL 5 MG/ML IJ SOLN: 5.0000 mg | Freq: Three times a day (TID) | INTRAMUSCULAR | Status: DC | PRN

## 2019-05-07 MED ORDER — SODIUM CHLORIDE 0.9 % IV BOLUS
500.0000 mL | Freq: Once | INTRAVENOUS | Status: AC
  Administered 2019-05-07: 20:00:00 500 mL via INTRAVENOUS

## 2019-05-07 MED ORDER — OXYCODONE HCL 5 MG PO TABS: 5.0000 mg | ORAL_TABLET | Freq: Once | ORAL | Status: DC | PRN

## 2019-05-07 MED ORDER — DOCUSATE SODIUM 100 MG PO CAPS
100.0000 mg | ORAL_CAPSULE | Freq: Two times a day (BID) | ORAL | Status: DC
  Administered 2019-05-07 – 2019-05-11 (×7): 100 mg via ORAL
  Filled 2019-05-07 (×8): qty 1

## 2019-05-07 MED ORDER — KETAMINE HCL 10 MG/ML IJ SOLN
INTRAMUSCULAR | Status: DC | PRN
  Administered 2019-05-07: 35 mg via INTRAVENOUS
  Administered 2019-05-07: 40 mg via INTRAVENOUS

## 2019-05-07 MED ORDER — LACTATED RINGERS IV SOLN: INTRAVENOUS | Status: DC | PRN

## 2019-05-07 MED ORDER — MAGNESIUM HYDROXIDE 400 MG/5ML PO SUSP
30.0000 mL | Freq: Every day | ORAL | Status: DC | PRN
  Administered 2019-05-09: 30 mL via ORAL
  Filled 2019-05-07: qty 30

## 2019-05-07 MED ORDER — MORPHINE SULFATE (PF) 2 MG/ML IV SOLN
2.0000 mg | INTRAVENOUS | Status: DC | PRN
  Administered 2019-05-07 (×3): 2 mg via INTRAVENOUS
  Filled 2019-05-07 (×2): qty 1

## 2019-05-07 MED ORDER — PROPOFOL 500 MG/50ML IV EMUL
INTRAVENOUS | Status: AC
  Filled 2019-05-07: qty 50

## 2019-05-07 MED ORDER — ENSURE ENLIVE PO LIQD
237.0000 mL | Freq: Two times a day (BID) | ORAL | Status: DC
  Administered 2019-05-08 – 2019-05-10 (×5): 237 mL via ORAL
  Filled 2019-05-07: qty 237

## 2019-05-07 MED ORDER — ENSURE ENLIVE PO LIQD: 237.0000 mL | Freq: Two times a day (BID) | ORAL | Status: DC

## 2019-05-07 MED ORDER — SODIUM CHLORIDE 0.9 % IV SOLN
INTRAVENOUS | Status: DC | PRN
  Administered 2019-05-07: 50 ug/min via INTRAVENOUS

## 2019-05-07 MED ORDER — PROPOFOL 10 MG/ML IV BOLUS
INTRAVENOUS | Status: AC
  Filled 2019-05-07: qty 20

## 2019-05-07 MED ORDER — NITROGLYCERIN 0.4 MG SL SUBL
SUBLINGUAL_TABLET | SUBLINGUAL | Status: AC
  Administered 2019-05-07: 0.4 mg
  Filled 2019-05-07: qty 1

## 2019-05-07 MED ORDER — SODIUM CHLORIDE 0.9 % IV SOLN: INTRAVENOUS | Status: DC

## 2019-05-07 MED ORDER — PHENYLEPHRINE HCL (PRESSORS) 10 MG/ML IV SOLN
INTRAVENOUS | Status: DC | PRN
  Administered 2019-05-07 (×3): 100 ug via INTRAVENOUS

## 2019-05-07 MED ORDER — ENOXAPARIN SODIUM 30 MG/0.3ML ~~LOC~~ SOLN
30.0000 mg | SUBCUTANEOUS | Status: DC
  Administered 2019-05-08 – 2019-05-10 (×3): 30 mg via SUBCUTANEOUS
  Filled 2019-05-07 (×3): qty 0.3

## 2019-05-07 MED ORDER — FENTANYL CITRATE (PF) 100 MCG/2ML IJ SOLN: 25.0000 ug | INTRAMUSCULAR | Status: DC | PRN

## 2019-05-07 MED ORDER — MAGNESIUM CITRATE PO SOLN: 1.0000 | Freq: Once | ORAL | Status: DC | PRN

## 2019-05-07 MED ORDER — POTASSIUM CHLORIDE CRYS ER 20 MEQ PO TBCR
40.0000 meq | EXTENDED_RELEASE_TABLET | Freq: Once | ORAL | Status: AC
  Administered 2019-05-07: 40 meq via ORAL
  Filled 2019-05-07: qty 2

## 2019-05-07 MED ORDER — ONDANSETRON HCL 4 MG/2ML IJ SOLN
INTRAMUSCULAR | Status: DC | PRN
  Administered 2019-05-07: 4 mg via INTRAVENOUS

## 2019-05-07 MED ORDER — CLINDAMYCIN PHOSPHATE 600 MG/50ML IV SOLN
600.0000 mg | Freq: Four times a day (QID) | INTRAVENOUS | Status: AC
  Administered 2019-05-07 – 2019-05-08 (×3): 600 mg via INTRAVENOUS
  Filled 2019-05-07 (×3): qty 50

## 2019-05-07 MED ORDER — METOCLOPRAMIDE HCL 10 MG PO TABS: 5.0000 mg | ORAL_TABLET | Freq: Three times a day (TID) | ORAL | Status: DC | PRN

## 2019-05-07 MED ORDER — BUPIVACAINE HCL (PF) 0.5 % IJ SOLN
INTRAMUSCULAR | Status: DC | PRN
  Administered 2019-05-07: 2 mL via INTRATHECAL

## 2019-05-07 MED ORDER — EPHEDRINE SULFATE 50 MG/ML IJ SOLN
INTRAMUSCULAR | Status: DC | PRN
  Administered 2019-05-07: 15 mg via INTRAVENOUS

## 2019-05-07 MED ORDER — ALUM & MAG HYDROXIDE-SIMETH 200-200-20 MG/5ML PO SUSP: 30.0000 mL | ORAL | Status: DC | PRN

## 2019-05-07 MED ORDER — PROPOFOL 10 MG/ML IV BOLUS
INTRAVENOUS | Status: DC | PRN
  Administered 2019-05-07: 80 ug/kg/min via INTRAVENOUS

## 2019-05-07 MED ORDER — NEOMYCIN-POLYMYXIN B GU 40-200000 IR SOLN
Status: DC | PRN
  Administered 2019-05-07: 2 mL

## 2019-05-07 MED ORDER — MENTHOL 3 MG MT LOZG: 1.0000 | LOZENGE | OROMUCOSAL | Status: DC | PRN

## 2019-05-07 MED ORDER — KETAMINE HCL 50 MG/ML IJ SOLN
INTRAMUSCULAR | Status: AC
  Filled 2019-05-07: qty 10

## 2019-05-07 MED ORDER — MIDAZOLAM HCL 2 MG/2ML IJ SOLN
INTRAMUSCULAR | Status: AC
  Filled 2019-05-07: qty 2

## 2019-05-07 MED ORDER — BISACODYL 10 MG RE SUPP
10.0000 mg | Freq: Every day | RECTAL | Status: DC | PRN
  Administered 2019-05-10: 10 mg via RECTAL
  Filled 2019-05-07: qty 1

## 2019-05-07 MED ORDER — PHENOL 1.4 % MT LIQD: 1.0000 | OROMUCOSAL | Status: DC | PRN

## 2019-05-07 SURGICAL SUPPLY — 34 items
BIT DRILL 4.3MMS DISTAL GRDTED (BIT) ×1 IMPLANT
CANISTER SUCT 1200ML W/VALVE (MISCELLANEOUS) ×2 IMPLANT
CHLORAPREP W/TINT 26 (MISCELLANEOUS) ×2 IMPLANT
COVER WAND RF STERILE (DRAPES) ×2 IMPLANT
DRAPE 3/4 80X56 (DRAPES) ×2 IMPLANT
DRAPE U-SHAPE 47X51 STRL (DRAPES) ×2 IMPLANT
DRILL 4.3MMS DISTAL GRADUATED (BIT) ×2
DRSG OPSITE POSTOP 3X4 (GAUZE/BANDAGES/DRESSINGS) ×6 IMPLANT
GLOVE BIOGEL PI IND STRL 9 (GLOVE) ×1 IMPLANT
GLOVE BIOGEL PI INDICATOR 9 (GLOVE) ×1
GLOVE SURG SYN 9.0  PF PI (GLOVE) ×1
GLOVE SURG SYN 9.0 PF PI (GLOVE) ×1 IMPLANT
GOWN SRG 2XL LVL 4 RGLN SLV (GOWNS) ×1 IMPLANT
GOWN STRL NON-REIN 2XL LVL4 (GOWNS) ×1
GOWN STRL REUS W/ TWL LRG LVL3 (GOWN DISPOSABLE) ×1 IMPLANT
GOWN STRL REUS W/TWL LRG LVL3 (GOWN DISPOSABLE) ×1
GUIDEPIN VERSANAIL DSP 3.2X444 (ORTHOPEDIC DISPOSABLE SUPPLIES) ×2 IMPLANT
GUIDEWIRE BALL NOSE 100CM (WIRE) ×2 IMPLANT
HFN RH 130 DEG 9MM X 320MM (Nail) ×2 IMPLANT
HIP FRA NAIL LAG SCREW 10.5X90 (Orthopedic Implant) ×2 IMPLANT
KIT TURNOVER KIT A (KITS) ×2 IMPLANT
MAT ABSORB  FLUID 56X50 GRAY (MISCELLANEOUS) ×1
MAT ABSORB FLUID 56X50 GRAY (MISCELLANEOUS) ×1 IMPLANT
NEEDLE FILTER BLUNT 18X 1/2SAF (NEEDLE) ×1
NEEDLE FILTER BLUNT 18X1 1/2 (NEEDLE) ×1 IMPLANT
NS IRRIG 500ML POUR BTL (IV SOLUTION) ×2 IMPLANT
PACK HIP COMPR (MISCELLANEOUS) ×2 IMPLANT
SCALPEL PROTECTED #15 DISP (BLADE) ×4 IMPLANT
SCREW BONE CORTICAL 5.0X42 (Screw) ×2 IMPLANT
SCREW LAG HIP FRA NAIL 10.5X90 (Orthopedic Implant) ×1 IMPLANT
STAPLER SKIN PROX 35W (STAPLE) ×2 IMPLANT
SUT VIC AB 1 CT1 36 (SUTURE) ×2 IMPLANT
SUT VIC AB 2-0 CT1 (SUTURE) ×2 IMPLANT
SYR 10ML LL (SYRINGE) ×2 IMPLANT

## 2019-05-07 NOTE — Progress Notes (Signed)
Pt c/o 10/10 pain on the left lower chest, on 2 liters oxygen. Pt given PRN Morphine IV dose. Pt called again less than 5 minutes after giving morphine, pain is 7/10 but claims it as "steady" pain. EKG done per protocol. Text paged Webb Silversmith, NP and ordered to give Nitroglycerin tab SL. Nitroglycerin 1 tab given SL as ordered. Pt conversant at this time and rated pain as 6/10. Will continue to closely monitor.

## 2019-05-07 NOTE — Progress Notes (Signed)
Initial Nutrition Assessment  DOCUMENTATION CODES:   Obesity unspecified  INTERVENTION:   Ensure Enlive po BID, each supplement provides 350 kcal and 20 grams of protein  NUTRITION DIAGNOSIS:   Increased nutrient needs related to post-op healing, other (see comment)(COPD) as evidenced by increased estimated needs.  GOAL:   Patient will meet greater than or equal to 90% of their needs  MONITOR:   PO intake, Supplement acceptance, Labs, Weight trends, Skin, I & O's  REASON FOR ASSESSMENT:   Consult Assessment of nutrition requirement/status  ASSESSMENT:   79 y.o. female with medical history significant for HTN, COPD, GERD, AAA thoracic descending aorta, recurrent UTIs on nitrofurantoin and tobacco use who presents to the emergency room with right hip pain following a fall.   Unable to speak with patient as pt in surgery at time of RD visit. Pt with increased estimated needs r/t COPD and hip fracture. RD will add supplements to help pt meet her estimated needs. Per chart, pt's UBW appears to be ~190lbs; it appears pt has lost 34lbs(18%) over the past year. RD will obtain nutrition related history and exam at follow-up.    Medications reviewed and include: aspirin, B-complex w/ C, calcium acetate, oscal with D, D3, nicotine, protonix  Labs reviewed: creat 1.44(H) Wbc- 17.5(H)  NUTRITION - FOCUSED PHYSICAL EXAM: Unable to perform at this time   Diet Order:   Diet Order            Diet NPO time specified  Diet effective now             EDUCATION NEEDS:   Not appropriate for education at this time  Skin:  Skin Assessment: Reviewed RN Assessment(ecchymosis, MASD, incision R hip)  Last BM:  PTA  Height:   Ht Readings from Last 1 Encounters:  05/07/19 5' (1.524 m)    Weight:   Wt Readings from Last 1 Encounters:  05/07/19 71 kg    Ideal Body Weight:  45.4 kg  BMI:  Body mass index is 30.57 kg/m.  Estimated Nutritional Needs:   Kcal:   1500-1700kcal/day  Protein:  75-85g/day  Fluid:  >1.2L/day  Betsey Holiday MS, RD, LDN Please refer to [facility] for RD and/or RD on-call/weekend/after hours pager

## 2019-05-07 NOTE — Op Note (Signed)
05/07/2019  12:40 PM  PATIENT:  Holly Shelton  79 y.o. female  PRE-OPERATIVE DIAGNOSIS:  Hip Fx subtrochanteric  POST-OPERATIVE DIAGNOSIS:  hip Fx subtrochanteric  PROCEDURE:  Procedure(s): INTRAMEDULLARY (IM) NAIL INTERTROCHANTRIC (Right) with open reduction internal fixation of subtrochanteric fracture  SURGEON: Leitha Schuller, MD  ASSISTANTS: None  ANESTHESIA:   spinal  EBL:  Total I/O In: 1450 [I.V.:1400; IV Piggyback:50] Out: 250 [Blood:250]  BLOOD ADMINISTERED:none  DRAINS: none   LOCAL MEDICATIONS USED:  NONE  SPECIMEN:  No Specimen  DISPOSITION OF SPECIMEN:  N/A  COUNTS:  YES  TOURNIQUET:  * No tourniquets in log *  IMPLANTS: Biomet affixes 9 x 320 rod with 90 mm leg screw and 42 mm distal interlocking screw  DICTATION: .Dragon Dictation patient was brought to the operating room and after adequate spinal anesthesia was obtained she was placed on the fracture table.  The left leg is in the well-leg holder and traction applied through the traction boot on the right side.  AP lateral imaging showed the proximal fragment was flexed and externally rotated so an open reduction was required.  After prepping and draping using the barrier drape technique appropriate patient identification and timeout procedures were completed.  After checking position of the bone on the C arm a proximal incision was made at the level of the fracture to allow for reduction clamp to be placed around the proximal fragment and bring it out of flexion and try to internally rotate as much as possible.  Proximal incision was then made at the tip of the trochanter and a guidewire inserted into the tip with proximal reaming carried out and with some difficulty the guidewire was passed because of the excess extraordinary deformity.  After the guidewire was passed a 9 mm rod was inserted after reaming to 11 mm showed some chatter in the shaft.  This was based on measurement earlier and 320 x 9 mm rod was  inserted to the appropriate depth.  Because of the subtrochanteric fracture no compression was required and the after the leg screw was inserted in the center superior position it was fixed and traction was released at this point to allow compression at the fracture site.  With the fracture showing bone apposition in both AP lateral projections the distal interlocking screw was placed through the oblique hole to allow for further compression with weightbearing.  The distal screw was placed using standard technique identifying the screw hole small incision spreading the soft tissues drilling measuring and placing a 42 mm screw.  Permanent C arm views were obtained proximally and distally showing good rod position and screw position.  The wounds were thoroughly irrigated and the deep fascia repaired using 0 Vicryl followed by 2-0 Vicryl subcutaneously and skin staples.  Xeroform and honeycomb dressings applied  PLAN OF CARE: Continue as inpatient  PATIENT DISPOSITION:  PACU - hemodynamically stable.

## 2019-05-07 NOTE — Consult Note (Signed)
Reason for Consult: Right subtrochanteric hip fracture Referring Physician: Dr. Elba Barman is an 79 y.o. female.  HPI: Patient is a frail 79 year old who will got up to go to the bathroom and her right leg gave out landing on her right side with immediate pain and felt a pop.  She has a history of some chronic right leg pain looking through her chart some of this may be from her back she also apparently suffers from neuropathy.  She uses a cane normally and is a household ambulator.  She reports that she has trouble walking around her home because of leg pains.  She denies prodromal symptoms with this  Past Medical History:  Diagnosis Date  . Hypertension     History reviewed. No pertinent surgical history.  History reviewed. No pertinent family history.  Social History:  reports that she has been smoking. She has never used smokeless tobacco. She reports that she does not drink alcohol or use drugs.  Allergies:  Allergies  Allergen Reactions  . Penicillins Rash    Medications: I have reviewed the patient's current medications.  Results for orders placed or performed during the hospital encounter of 05/06/19 (from the past 48 hour(s))  CBC with Differential     Status: Abnormal   Collection Time: 05/06/19  6:08 PM  Result Value Ref Range   WBC 13.0 (H) 4.0 - 10.5 K/uL   RBC 4.09 3.87 - 5.11 MIL/uL   Hemoglobin 12.3 12.0 - 15.0 g/dL   HCT 24.4 01.0 - 27.2 %   MCV 89.2 80.0 - 100.0 fL   MCH 30.1 26.0 - 34.0 pg   MCHC 33.7 30.0 - 36.0 g/dL   RDW 53.6 64.4 - 03.4 %   Platelets 452 (H) 150 - 400 K/uL   nRBC 0.0 0.0 - 0.2 %   Neutrophils Relative % 78 %   Neutro Abs 10.3 (H) 1.7 - 7.7 K/uL   Lymphocytes Relative 10 %   Lymphs Abs 1.4 0.7 - 4.0 K/uL   Monocytes Relative 8 %   Monocytes Absolute 1.0 0.1 - 1.0 K/uL   Eosinophils Relative 1 %   Eosinophils Absolute 0.1 0.0 - 0.5 K/uL   Basophils Relative 1 %   Basophils Absolute 0.1 0.0 - 0.1 K/uL   Immature  Granulocytes 2 %   Abs Immature Granulocytes 0.20 (H) 0.00 - 0.07 K/uL    Comment: Performed at [facility], [address] [place], [place] [address]  Basic metabolic panel     Status: Abnormal   Collection Time: 05/06/19  6:08 PM  Result Value Ref Range   Sodium 138 135 - 145 mmol/L   Potassium 3.3 (L) 3.5 - 5.1 mmol/L   Chloride 99 98 - 111 mmol/L   CO2 24 22 - 32 mmol/L   Glucose, Bld 136 (H) 70 - 99 mg/dL    Comment: Glucose reference range applies only to samples taken after fasting for at least 8 hours.   BUN 15 8 - 23 mg/dL   Creatinine, Ser 5.63 (H) 0.44 - 1.00 mg/dL   Calcium 9.8 8.9 - 87.5 mg/dL   GFR calc non Af Amer 45 (L) >60 mL/min   GFR calc Af Amer 52 (L) >60 mL/min   Anion gap 15 5 - 15    Comment: Performed at [facility], [address] [place], [place] [address]  Respiratory Panel by RT PCR (Flu A&B, Covid) - Nasopharyngeal Swab     Status: None  Collection Time: 05/06/19  7:16 PM   Specimen: Nasopharyngeal Swab  Result Value Ref Range   SARS Coronavirus 2 by RT PCR NEGATIVE NEGATIVE    Comment: (NOTE) SARS-CoV-2 target nucleic acids are NOT DETECTED. The SARS-CoV-2 RNA is generally detectable in upper respiratoy specimens during the acute phase of infection. The lowest concentration of SARS-CoV-2 viral copies this assay can detect is 131 copies/mL. A negative result does not preclude SARS-Cov-2 infection and should not be used as the sole basis for treatment or other patient management decisions. A negative result may occur with  improper specimen collection/handling, submission of specimen other than nasopharyngeal swab, presence of viral mutation(s) within the areas targeted by this assay, and inadequate number of viral copies (<131 copies/mL). A negative result must be combined with clinical observations, patient history, and epidemiological information. The expected result is Negative. Fact Sheet for Patients:   PinkCheek.be Fact Sheet for Healthcare Providers:  GravelBags.it This test is not yet ap proved or cleared by the [place] FDA and  has been authorized for detection and/or diagnosis of SARS-CoV-2 by FDA under an Emergency Use Authorization (EUA). This EUA will remain  in effect (meaning this test can be used) for the duration of the COVID-19 declaration under Section 564(b)(1) of the Act, 21 U.S.C. section 360bbb-3(b)(1), unless the authorization is terminated or revoked sooner.    Influenza A by PCR NEGATIVE NEGATIVE   Influenza B by PCR NEGATIVE NEGATIVE    Comment: (NOTE) The Xpert Xpress SARS-CoV-2/FLU/RSV assay is intended as an aid in  the diagnosis of influenza from Nasopharyngeal swab specimens and  should not be used as a sole basis for treatment. Nasal washings and  aspirates are unacceptable for Xpert Xpress SARS-CoV-2/FLU/RSV  testing. Fact Sheet for Patients: PinkCheek.be Fact Sheet for Healthcare Providers: GravelBags.it This test is not yet approved or cleared by the [place] FDA and  has been authorized for detection and/or diagnosis of SARS-CoV-2 by  FDA under an Emergency Use Authorization (EUA). This EUA will remain  in effect (meaning this test can be used) for the duration of the  Covid-19 declaration under Section 564(b)(1) of the Act, 21  U.S.C. section 360bbb-3(b)(1), unless the authorization is  terminated or revoked. Performed at [facility], [address] [place], [place] [address]   MRSA PCR Screening     Status: None   Collection Time: 05/07/19  4:23 AM   Specimen: Nasal Mucosa; Nasopharyngeal  Result Value Ref Range   MRSA by PCR NEGATIVE NEGATIVE    Comment:        The GeneXpert MRSA Assay (FDA approved for NASAL specimens only), is one component of a comprehensive MRSA colonization surveillance program.  It is not intended to diagnose MRSA infection nor to guide or monitor treatment for MRSA infections. Performed at [facility], [address] [place], [place] [address]   CBC     Status: Abnormal   Collection Time: 05/07/19  5:07 AM  Result Value Ref Range   WBC 17.5 (H) 4.0 - 10.5 K/uL   RBC 4.06 3.87 - 5.11 MIL/uL   Hemoglobin 12.2 12.0 - 15.0 g/dL   HCT 37.0 36.0 - 46.0 %   MCV 91.1 80.0 - 100.0 fL   MCH 30.0 26.0 - 34.0 pg   MCHC 33.0 30.0 - 36.0 g/dL   RDW 13.5 11.5 - 15.5 %   Platelets 409 (H) 150 - 400 K/uL   nRBC 0.0 0.0 - 0.2 %  Comment: Performed at [facility], [address] [place], [place] [address]  Basic metabolic panel     Status: Abnormal   Collection Time: 05/07/19  5:07 AM  Result Value Ref Range   Sodium 139 135 - 145 mmol/L   Potassium 3.5 3.5 - 5.1 mmol/L   Chloride 98 98 - 111 mmol/L   CO2 26 22 - 32 mmol/L   Glucose, Bld 145 (H) 70 - 99 mg/dL    Comment: Glucose reference range applies only to samples taken after fasting for at least 8 hours.   BUN 19 8 - 23 mg/dL   Creatinine, Ser 4.09 (H) 0.44 - 1.00 mg/dL   Calcium 9.3 8.9 - 81.1 mg/dL   GFR calc non Af Amer 35 (L) >60 mL/min   GFR calc Af Amer 41 (L) >60 mL/min   Anion gap 15 5 - 15    Comment: Performed at [facility], [address] [place], [place] [address]    DG Chest 1 View  Result Date: 05/06/2019 CLINICAL DATA:  Recent fall with chest pain, initial encounter EXAM: CHEST  1 VIEW COMPARISON:  05/19/2013 FINDINGS: Cardiac shadow is enlarged. Thoracic aorta is tortuous and accentuated by mild patient rotation. The lungs are clear bilaterally. No acute bony abnormality is seen. IMPRESSION: Cardiomegaly and tortuous aorta.  No acute abnormality is seen. Electronically Signed   By: Alcide Clever M.D.   On: 05/06/2019 19:03   DG Hip Unilat W or Wo Pelvis 2-3 Views Right  Result Date: 05/06/2019 CLINICAL DATA:  Right hip pain post fall. EXAM: DG HIP (WITH OR  WITHOUT PELVIS) 2-3V RIGHT COMPARISON:  None. FINDINGS: There is a transverse severely displaced and impacted subtrochanteric fracture of the proximal right femoral shaft. The right femoral head is normally located within the acetabulum. IMPRESSION: Transverse severely displaced and impacted subtrochanteric fracture of the proximal right femoral shaft. Electronically Signed   By: Ted Mcalpine M.D.   On: 05/06/2019 19:06    Review of Systems Blood pressure 134/84, pulse (!) 109, temperature 98.2 F (36.8 C), temperature source Oral, resp. rate 18, height 5' (1.524 m), weight 71 kg, SpO2 98 %. Physical Exam her right leg is shortened and slightly internally rotated.  She is able to flex extend her toes but says she cannot feel them.  This is unchanged from her preinjury status.  She has trace pulses and has no ecchymosis around the proximal thigh. Radiographic review reveals a very displaced subtrochanteric fracture with left the proximal fragment flexed and externally rotated.  Assessment/Plan: Subtrochanteric right hip fracture with complete displacement Plan is for open reduction internal fixation.  It explained to her that with her fracture we need to a get to the fracture and pull the proximal fragment into position so be a larger incision than normal then required with an intertrochanteric fracture short of the implant that we use.  Plan weightbearing as tolerated postoperatively.  Risks of surgery were discussed in particular DVT/PE and infection.  Kennedy Bucker 05/07/2019, 7:28 AM

## 2019-05-07 NOTE — Progress Notes (Addendum)
Pt BP 76/52. MD paged. New order received. Yellow MEWS protocol initiated. Will cont to monitor the Pt

## 2019-05-07 NOTE — Anesthesia Procedure Notes (Signed)
Spinal  Patient location during procedure: OR Start time: 05/07/2019 11:15 AM End time: 05/07/2019 11:20 AM Staffing Performed: anesthesiologist  Anesthesiologist: Sylvia Helms, Precious Haws, MD Preanesthetic Checklist Completed: patient identified, IV checked, site marked, risks and benefits discussed, surgical consent, monitors and equipment checked, pre-op evaluation and timeout performed Spinal Block Patient position: sitting Prep: ChloraPrep Patient monitoring: heart rate, continuous pulse ox, blood pressure and cardiac monitor Approach: midline Location: L3-4 Injection technique: single-shot Needle Needle type: Sprotte  Needle gauge: 22 G Needle length: 9 cm Assessment Sensory level: T10 Additional Notes Clindamycin administered in the OR before the spinal was placed  Negative paresthesia. Negative blood return. Positive free-flowing CSF. Expiration date of kit checked and confirmed. Patient tolerated procedure well, without complications.

## 2019-05-07 NOTE — Progress Notes (Signed)
15 minute call to floor. 

## 2019-05-07 NOTE — Progress Notes (Signed)
PHARMACIST - PHYSICIAN COMMUNICATION  CONCERNING:  Enoxaparin (Lovenox) for DVT Prophylaxis    RECOMMENDATION: Patient was prescribed enoxaprin 40mg  q24 hours for VTE prophylaxis.   Filed Weights   05/06/19 1806 05/07/19 0040  Weight: 66.8 kg (147 lb 4.3 oz) 71 kg (156 lb 8.4 oz)    Body mass index is 30.57 kg/m.  Estimated Creatinine Clearance: 29.2 mL/min (A) (by C-G formula based on SCr of 1.44 mg/dL (H)).   Patient is candidate for enoxaparin 30mg  every 24 hours based on CrCl <74ml/min or Weight less then 45kg for women or <57kg for men   DESCRIPTION: Pharmacy has adjusted enoxaparin dose per [facility] policy.  Patient is now receiving enoxaparin 30mg  every 24 hours.  31m, PharmD Clinical Pharmacist  05/07/2019 2:33 PM

## 2019-05-07 NOTE — Progress Notes (Signed)
PROGRESS NOTE                                                                                                                                                                                                             Patient Demographics:    Holly Shelton, is a 79 y.o. female, DOB - 1941/01/14, YTW:[id number]  Admit date - 05/06/2019   Admitting Physician Andris Baumann, MD  Outpatient Primary MD for the patient is Patient, No Pcp Per  LOS - 1  Outpatient Specialists: None (has cardiologist in [facility] but not seen over past 2 years)  Chief Complaint  Patient presents with  . Fall  . Hip Pain       Brief Narrative 79 year old female with hypertension, COPD with active tobacco use, GERD, AAA of descending thoracic aorta and recurrent UTIs on nitrofurantoin presented to the ED with mechanical fall and sustained right hip pain. In the ED x-ray of the hip showed transverse displaced and impacted right subtrochanteric fracture. Blood work showed mild AKI with creatinine of 1.18. Admitted for further management and taken to the OR this morning.   Subjective:   Seen in the morning before going to the OR.  Complains of pain in the right hip.   Assessment  & Plan :   Principal problem Closed displaced subtrochanteric right femur fracture (HCC) Secondary to mechanical fall.  Pain control with Vicodin and as needed IV morphine. Seen by cardiology and cleared preop with moderate risk (active smoker, AAA).  Patient complained of left lower rib chest pain which appears musculoskeletal.  Normal EKG and negative troponins.  2D echo ordered and will be followed. Bowel regimen, postoperative DVT prophylaxis, PT evaluation and disposition.  Active problems Descending thoracic aortic aneurysm Last CT of the chest from 9///2019 at [facility] showing aneurysmal dilatation measuring 5.2 x 5.8 cm.  Reportedly being followed by  cardiothoracic surgery at [facility].  Acute kidney injury Mild.  Monitor with gentle hydration.  Hold nitrofurantoin for now.  COPD Currently stable.  Active tobacco use and counseled on cessation.  Continue home bronchodilators.  Nicotine patch.  Essential hypertension Resume home antihypertensive.  GERD Continue PPI  History of recurrent UTI On chronic nitrofurantoin, held due to AKI.  Code Status : Full code  Family Communication  : Husband at bedside  Disposition Plan  :  PT eval postop, possibly needs SNF  Barriers For Discharge : Pending postoperative course, possible discharge on 4/6  Consults  : Orthopedics, cardiology for clearance  Procedures  : 2D echo, hip surgery  DVT Prophylaxis  : Preop  Lab Results  Component Value Date   PLT 409 (H) 05/07/2019    Antibiotics  :    Anti-infectives (From admission, onward)   Start     Dose/Rate Route Frequency Ordered Stop   05/07/19 0800  clindamycin (CLEOCIN) IVPB 600 mg     600 mg 100 mL/hr over 30 Minutes Intravenous  Once 05/06/19 1928 05/07/19 1115   05/07/19 0000  nitrofurantoin (MACRODANTIN) capsule 50 mg  Status:  Discontinued     50 mg Oral Daily at bedtime 05/06/19 1957 05/07/19 0847        Objective:   Vitals:   05/06/19 2330 05/07/19 0040 05/07/19 0433 05/07/19 0800  BP: 106/74 95/67 134/84 99/72  Pulse: 95 100 (!) 109 (!) 105  Resp:  17 18 16   Temp:  (!) 97.5 F (36.4 C) 98.2 F (36.8 C) 97.8 F (36.6 C)  TempSrc:  Oral Oral Oral  SpO2: 98% 92% 98% 99%  Weight:  71 kg    Height:  5' (1.524 m)      Wt Readings from Last 3 Encounters:  05/07/19 71 kg     Intake/Output Summary (Last 24 hours) at 05/07/2019 1234 Last data filed at 05/07/2019 1231 Gross per 24 hour  Intake 1450 ml  Output 250 ml  Net 1200 ml     Physical Exam  Gen: In some distress with pain, disheveled HEENT: Moist mucosa, supple neck Chest: clear b/l, no added sounds CVS: N S1&S2, no murmurs,  GI: soft, NT, ND,    Musculoskeletal: warm, impaired mobility of the right hip    Data Review:    CBC Recent Labs  Lab 05/06/19 1808 05/07/19 0507  WBC 13.0* 17.5*  HGB 12.3 12.2  HCT 36.5 37.0  PLT 452* 409*  MCV 89.2 91.1  MCH 30.1 30.0  MCHC 33.7 33.0  RDW 13.3 13.5  LYMPHSABS 1.4  --   MONOABS 1.0  --   EOSABS 0.1  --   BASOSABS 0.1  --     Chemistries  Recent Labs  Lab 05/06/19 1808 05/07/19 0507  NA 138 139  K 3.3* 3.5  CL 99 98  CO2 24 26  GLUCOSE 136* 145*  BUN 15 19  CREATININE 1.18* 1.44*  CALCIUM 9.8 9.3   ------------------------------------------------------------------------------------------------------------------ No results for input(s): CHOL, HDL, LDLCALC, TRIG, CHOLHDL, LDLDIRECT in the last 72 hours.  No results found for: HGBA1C ------------------------------------------------------------------------------------------------------------------ No results for input(s): TSH, T4TOTAL, T3FREE, THYROIDAB in the last 72 hours.  Invalid input(s): FREET3 ------------------------------------------------------------------------------------------------------------------ No results for input(s): VITAMINB12, FOLATE, FERRITIN, TIBC, IRON, RETICCTPCT in the last 72 hours.  Coagulation profile No results for input(s): INR, PROTIME in the last 168 hours.  No results for input(s): DDIMER in the last 72 hours.  Cardiac Enzymes No results for input(s): CKMB, TROPONINI, MYOGLOBIN in the last 168 hours.  Invalid input(s): CK ------------------------------------------------------------------------------------------------------------------ No results found for: BNP  Inpatient Medications  Scheduled Meds: . [MAR Hold] amLODipine  5 mg Oral Daily  . [MAR Hold] aspirin EC  81 mg Oral QHS  . [MAR Hold] B-complex with vitamin C  1 tablet Oral Daily  . [MAR Hold] calcium acetate (Phos Binder)  667 mg Oral BID  . [MAR Hold] calcium-vitamin D  1 tablet Oral Daily  . [  MAR Hold]  cholecalciferol  1,000 Units Oral Daily  . [MAR Hold] mometasone-formoterol  2 puff Inhalation BID  . [MAR Hold] nicotine  14 mg Transdermal Daily  . [MAR Hold] pantoprazole  40 mg Oral Daily  . [MAR Hold] propranolol  40 mg Oral TID  . [MAR Hold] rosuvastatin  10 mg Oral QHS  . [MAR Hold] tiotropium  1 capsule Inhalation Daily   Continuous Infusions: PRN Meds:.[MAR Hold] albuterol, [MAR Hold] HYDROcodone-acetaminophen, [MAR Hold]  morphine injection, neomycin-polymyxin B, [MAR Hold] ondansetron **OR** [MAR Hold] ondansetron (ZOFRAN) IV, [MAR Hold] senna-docusate  Micro Results Recent Results (from the past 240 hour(s))  Respiratory Panel by RT PCR (Flu A&B, Covid) - Nasopharyngeal Swab     Status: None   Collection Time: 05/06/19  7:16 PM   Specimen: Nasopharyngeal Swab  Result Value Ref Range Status   SARS Coronavirus 2 by RT PCR NEGATIVE NEGATIVE Final    Comment: (NOTE) SARS-CoV-2 target nucleic acids are NOT DETECTED. The SARS-CoV-2 RNA is generally detectable in upper respiratoy specimens during the acute phase of infection. The lowest concentration of SARS-CoV-2 viral copies this assay can detect is 131 copies/mL. A negative result does not preclude SARS-Cov-2 infection and should not be used as the sole basis for treatment or other patient management decisions. A negative result may occur with  improper specimen collection/handling, submission of specimen other than nasopharyngeal swab, presence of viral mutation(s) within the areas targeted by this assay, and inadequate number of viral copies (<131 copies/mL). A negative result must be combined with clinical observations, patient history, and epidemiological information. The expected result is Negative. Fact Sheet for Patients:  PinkCheek.be Fact Sheet for Healthcare Providers:  GravelBags.it This test is not yet ap proved or cleared by the [place] FDA and    has been authorized for detection and/or diagnosis of SARS-CoV-2 by FDA under an Emergency Use Authorization (EUA). This EUA will remain  in effect (meaning this test can be used) for the duration of the COVID-19 declaration under Section 564(b)(1) of the Act, 21 U.S.C. section 360bbb-3(b)(1), unless the authorization is terminated or revoked sooner.    Influenza A by PCR NEGATIVE NEGATIVE Final   Influenza B by PCR NEGATIVE NEGATIVE Final    Comment: (NOTE) The Xpert Xpress SARS-CoV-2/FLU/RSV assay is intended as an aid in  the diagnosis of influenza from Nasopharyngeal swab specimens and  should not be used as a sole basis for treatment. Nasal washings and  aspirates are unacceptable for Xpert Xpress SARS-CoV-2/FLU/RSV  testing. Fact Sheet for Patients: PinkCheek.be Fact Sheet for Healthcare Providers: GravelBags.it This test is not yet approved or cleared by the [place] FDA and  has been authorized for detection and/or diagnosis of SARS-CoV-2 by  FDA under an Emergency Use Authorization (EUA). This EUA will remain  in effect (meaning this test can be used) for the duration of the  Covid-19 declaration under Section 564(b)(1) of the Act, 21  U.S.C. section 360bbb-3(b)(1), unless the authorization is  terminated or revoked. Performed at [facility], [address] [place], [place] [address]   MRSA PCR Screening     Status: None   Collection Time: 05/07/19  4:23 AM   Specimen: Nasal Mucosa; Nasopharyngeal  Result Value Ref Range Status   MRSA by PCR NEGATIVE NEGATIVE Final    Comment:        The GeneXpert MRSA Assay (FDA approved for NASAL specimens only), is one component of a comprehensive MRSA colonization surveillance program. It  is not intended to diagnose MRSA infection nor to guide or monitor treatment for MRSA infections. Performed at [facility], [address],  [place], [place] [address]     Radiology Reports DG Chest 1 View  Result Date: 05/06/2019 CLINICAL DATA:  Recent fall with chest pain, initial encounter EXAM: CHEST  1 VIEW COMPARISON:  05/19/2013 FINDINGS: Cardiac shadow is enlarged. Thoracic aorta is tortuous and accentuated by mild patient rotation. The lungs are clear bilaterally. No acute bony abnormality is seen. IMPRESSION: Cardiomegaly and tortuous aorta.  No acute abnormality is seen. Electronically Signed   By: Alcide Clever M.D.   On: 05/06/2019 19:03   DG Hip Unilat W or Wo Pelvis 2-3 Views Right  Result Date: 05/06/2019 CLINICAL DATA:  Right hip pain post fall. EXAM: DG HIP (WITH OR WITHOUT PELVIS) 2-3V RIGHT COMPARISON:  None. FINDINGS: There is a transverse severely displaced and impacted subtrochanteric fracture of the proximal right femoral shaft. The right femoral head is normally located within the acetabulum. IMPRESSION: Transverse severely displaced and impacted subtrochanteric fracture of the proximal right femoral shaft. Electronically Signed   By: Ted Mcalpine M.D.   On: 05/06/2019 19:06    Time Spent in minutes  25   Khaliya Golinski M.D on 05/07/2019 at 12:34 PM  Between 7am to 7pm - Pager - [phone]  After 7pm go to [email or web address] - password [facility]  [facility] -  Office  [phone]

## 2019-05-07 NOTE — Anesthesia Preprocedure Evaluation (Addendum)
Anesthesia Evaluation  Patient identified by MRN, date of birth, ID band Patient awake    Reviewed: Allergy & Precautions, H&P , NPO status , Patient's Chart, lab work & pertinent test results  History of Anesthesia Complications Negative for: history of anesthetic complications  Airway Mallampati: III  TM Distance: >3 FB Neck ROM: limited    Dental  (+) Poor Dentition, Upper Dentures, Lower Dentures   Pulmonary shortness of breath and with exertion, COPD,  COPD inhaler and oxygen dependent, Current Smoker and Patient abstained from smoking.,           Cardiovascular Exercise Tolerance: Poor hypertension, (-) Past MI      Neuro/Psych negative neurological ROS  negative psych ROS   GI/Hepatic Neg liver ROS, GERD  Medicated and Controlled,  Endo/Other  negative endocrine ROS  Renal/GU      Musculoskeletal   Abdominal   Peds  Hematology negative hematology ROS (+)   Anesthesia Other Findings Past Medical History: No date: Hypertension  History reviewed. No pertinent surgical history.  BMI    Body Mass Index: 30.57 kg/m      Reproductive/Obstetrics negative OB ROS                           Anesthesia Physical Anesthesia Plan  ASA: IV  Anesthesia Plan: Spinal   Post-op Pain Management:    Induction:   PONV Risk Score and Plan:   Airway Management Planned: Natural Airway and Nasal Cannula  Additional Equipment:   Intra-op Plan:   Post-operative Plan:   Informed Consent: I have reviewed the patients History and Physical, chart, labs and discussed the procedure including the risks, benefits and alternatives for the proposed anesthesia with the patient or authorized representative who has indicated his/her understanding and acceptance.     Dental Advisory Given  Plan Discussed with: Anesthesiologist, CRNA and Surgeon  Anesthesia Plan Comments: (New chest pain this AM  around 0630 requiring morphine and nitroglycerin.   Patient was then cleared by cardiology for surgery, chest pain thought to be MSK in nature, with at least moderate risk from GA.    Patient does have a leukocytosis at this time, thought to be reactive in nature.  No signs or symptoms of systemic infection other than leukocytosis at this time.  Patient has been told in the past that she is a poor candidate for general anesthesia in the past.   Risks and benefits of GA vs spinal in the setting of leukocytosis were discussed with patient, including but not limited to meningitis and or infection of spinal column.  Patient would like [place] to proceed with the spinal.  Plan to start her operative antibiotics before we place the spinal if this has not been done already.  Patient reports no bleeding problems and no anticoagulant use.  Plan for spinal with backup GA  Patient consented for risks of anesthesia including but not limited to:  - adverse reactions to medications - risk of bleeding, infection, nerve damage and headache - risk of failed spinal - damage to teeth, lips or other oral mucosa - sore throat or hoarseness - Damage to heart, brain, lungs or loss of life  Patient voiced understanding.)       Anesthesia Quick Evaluation

## 2019-05-07 NOTE — Transfer of Care (Signed)
Immediate Anesthesia Transfer of Care Note  Patient: Holly Shelton  Procedure(s) Performed: INTRAMEDULLARY (IM) NAIL INTERTROCHANTRIC (Right Hip)  Patient Location: PACU  Anesthesia Type:General  Level of Consciousness: sedated  Airway & Oxygen Therapy: Patient Spontanous Breathing and Patient connected to face mask oxygen  Post-op Assessment: Report given to RN and Post -op Vital signs reviewed and stable  Post vital signs: Reviewed and stable  Last Vitals:  Vitals Value Taken Time  BP    Temp    Pulse 78 05/07/19 1249  Resp 10 05/07/19 1249  SpO2 100 % 05/07/19 1249  Vitals shown include unvalidated device data.  Last Pain:  Vitals:   05/07/19 1246  TempSrc:   PainSc: (P) Asleep         Complications: No apparent anesthesia complications

## 2019-05-07 NOTE — Consult Note (Signed)
Cardiology Consultation:   Patient ID: Holly Shelton MRN: [id number]; DOB: 01/06/1941  Admit date: 05/06/2019 Date of Consult: 05/07/2019  Primary Care Provider: Patient, No Pcp Per Primary Cardiologist: New to [facility] Reason for consult: Chest pain Physician requesting consult  Dr. Gonzella Lex   Patient Profile:   Holly Shelton is a 79 y.o. female with a hx of Hypertension COPD, active smoker AAA Recurrent UTIs Presenting via EMS to the hospital after falling onto her right hip while ambulating with a cane yesterday,    History of Present Illness:   Holly Shelton she reports that she was going to the bathroom when her leg went out, fell to the ground, felt a pop with pain to right hip X-ray of the hip showed transverse displaced and impacted right subtrochanteric fracture.  In the emergency room placed on oxygen due to saturations dropping to 83% on room air  Nurse note 6:35 AM reporting 10 out of 10 left lower chest pain She was given IV morphine Pain down to 7/10 EKG was done, covering hospitalist ordered nitro Pain down to 6/10 Urgent surgery for her hip was canceled this morning given her chest pain Plan is for open reduction internal fixation  Chest x-ray with cardiomegaly, tortuous aorta Echocardiogram pending Troponin of 20  Continues to smoke one half up to 1 pack/day Uses a wheelchair at home Prior falls, fracture of right foot   Prior echocardiogram 2016 with left ventricular hypertrophy, normal ejection fraction, no significant valve disease  CT scan September 2019 Aneurysmal dilatation of the descending aorta similar to prior, measuring 5.2 x 5.8 cm in maximal orthogonal dimension prior to the aortic hiatus. Moderate aortoiliac calcified atherosclerotic changes. --- Coronary calcification noted on CT scan 4.5 cm aneurysmal ascending aorta  Family reports she has been losing weight past few months, no appetite   Past Medical History:  Diagnosis Date    . Hypertension     History reviewed. No pertinent surgical history.   Home Medications:  Prior to Admission medications   Medication Sig Start Date End Date Taking? Authorizing Provider  albuterol (VENTOLIN HFA) 108 (90 Base) MCG/ACT inhaler Inhale 2 puffs into the lungs every 6 (six) hours as needed.  04/10/19  Yes [provider]  amLODipine (NORVASC) 5 MG tablet Take 5 mg by mouth daily. 04/25/19  Yes [provider]  aspirin EC 81 MG tablet Take 81 mg by mouth at bedtime.   Yes [provider]  B Complex-C (B-COMPLEX WITH VITAMIN C) tablet Take 1 tablet by mouth daily.   Yes [provider]  Calcium 600-200 MG-UNIT tablet Take 1 tablet by mouth daily.   Yes [provider]  calcium acetate, Phos Binder, (PHOSLYRA) 667 MG/5ML SOLN Take 667 mg by mouth in the morning and at bedtime.   Yes [provider]  cholecalciferol (VITAMIN D3) 25 MCG (1000 UNIT) tablet Take 1,000 Units by mouth daily.   Yes [provider]  gabapentin (NEURONTIN) 600 MG tablet Take 600 mg by mouth See admin instructions. Take 600 mg at 8:00 am, 1200 mg at 2:00, 600 mg at 7:00 pm, 1200 mg at 2:00 am   Yes [provider]  loratadine (CLARITIN) 10 MG tablet Take 10 mg by mouth daily.   Yes [provider]  nitrofurantoin (MACRODANTIN) 50 MG capsule Take 50 mg by mouth at bedtime.  02/13/19  Yes [provider]  omeprazole (PRILOSEC) 40 MG capsule Take 40 mg by mouth daily. 04/12/19  Yes [provider]  oxybutynin (DITROPAN) 5 MG tablet Take 5 mg by mouth 3 (three) times daily. 04/10/19  Yes [provider]  pregabalin (LYRICA) 100 MG capsule Take 100 mg by mouth 3 (three) times daily. 04/25/19  Yes [provider]  propranolol (INDERAL) 40 MG tablet Take 40 mg by mouth 3 (three) times daily. 03/22/19  Yes [provider]  rosuvastatin (CRESTOR) 10 MG tablet Take 10 mg by mouth at bedtime. 04/12/19  Yes  [provider]  SPIRIVA HANDIHALER 18 MCG inhalation capsule Place 1 capsule into inhaler and inhale daily. 04/25/19  Yes [provider]  Grant Ruts INHUB 500-50 MCG/DOSE AEPB Inhale 1 puff into the lungs 2 (two) times daily. 05/03/19  Yes [provider]    Inpatient Medications: Scheduled Meds: . amLODipine  5 mg Oral Daily  . aspirin EC  81 mg Oral QHS  . B-complex with vitamin C  1 tablet Oral Daily  . calcium acetate (Phos Binder)  667 mg Oral BID  . calcium-vitamin D  1 tablet Oral Daily  . cholecalciferol  1,000 Units Oral Daily  . mometasone-formoterol  2 puff Inhalation BID  . nicotine  14 mg Transdermal Daily  . pantoprazole  40 mg Oral Daily  . propranolol  40 mg Oral TID  . rosuvastatin  10 mg Oral QHS  . tiotropium  1 capsule Inhalation Daily   Continuous Infusions: . clindamycin (CLEOCIN) IV     PRN Meds: albuterol, HYDROcodone-acetaminophen, morphine injection, ondansetron **OR** ondansetron (ZOFRAN) IV, senna-docusate  Allergies:    Allergies  Allergen Reactions  . Penicillins Rash    Social History:   Social History   Socioeconomic History  . Marital status: Married    Spouse name: Not on file  . Number of children: Not on file  . Years of education: Not on file  . Highest education level: Not on file  Occupational History  . Not on file  Tobacco Use  . Smoking status: Current Every Day Smoker  . Smokeless tobacco: Never Used  Substance and Sexual Activity  . Alcohol use: Never  . Drug use: Never  . Sexual activity: Not on file  Other Topics Concern  . Not on file  Social History Narrative  . Not on file   Social Determinants of Health   Financial Resource Strain:   . Difficulty of Paying Living Expenses:   Food Insecurity:   . Worried About Charity fundraiser in the Last Year:   . Arboriculturist in the Last Year:   [place] Needs:   . Film/video editor (Medical):   Holly Shelton  (Non-Medical):   Physical Activity:   . Days of Exercise per Week:   . Minutes of Exercise per Session:   Stress:   . Feeling of Stress :   Social Connections:   . Frequency of Communication with Friends and Family:   . Frequency of Social Gatherings with Friends and Family:   . Attends Religious Services:   . Active Member of Clubs or Organizations:   . Attends Archivist Meetings:   Holly Kitchen Marital Status:   Intimate Partner Violence:   . Fear of Current or Ex-Partner:   . Emotionally Abused:   Holly Kitchen Physically Abused:   . Sexually Abused:     Family History:   History reviewed. No pertinent family history.   ROS:  Please see the history of present illness.  Review of Systems  Constitutional: Negative.  HENT: Negative.   Respiratory: Negative.   Cardiovascular: Negative.   Gastrointestinal: Negative.   Musculoskeletal: Positive for joint pain.  Neurological: Negative.   Psychiatric/Behavioral: Negative.   All other systems reviewed and are negative.   Physical Exam/Data:   Vitals:   05/06/19 2330 05/07/19 0040 05/07/19 0433 05/07/19 0800  BP: 106/74 95/67 134/84 99/72  Pulse: 95 100 (!) 109 (!) 105  Resp:  17 18 16   Temp:  (!) 97.5 F (36.4 C) 98.2 F (36.8 C) 97.8 F (36.6 C)  TempSrc:  Oral Oral Oral  SpO2: 98% 92% 98% 99%  Weight:  71 kg    Height:  5' (1.524 m)     No intake or output data in the 24 hours ending 05/07/19 0924 Last 3 Weights 05/07/2019 05/06/2019  Weight (lbs) 156 lb 8.4 oz 147 lb 4.3 oz  Weight (kg) 71 kg 66.8 kg     Body mass index is 30.57 kg/m.  General:  Well nourished, well developed, in no acute distress HEENT: normal Lymph: no adenopathy Neck: no JVD Endocrine:  No thryomegaly Vascular: No carotid bruits; FA pulses 2+ bilaterally without bruits  Cardiac:  normal S1, S2; RRR; no murmur  Lungs:  clear to auscultation bilaterally, no wheezing, rhonchi or rales  Abd: soft, nontender, no hepatomegaly  Ext: no  edema Musculoskeletal: Unable to move right leg secondary to pain Skin: warm and dry  Neuro:  CNs 2-12 intact, no focal abnormalities noted Psych:  Normal affect   EKG:  The EKG was personally reviewed and demonstrates:   Sinus tachycardia no significant ST-T wave changes Telemetry:  Telemetry was personally reviewed and demonstrates: Normal sinus rhythm  Relevant CV Studies: Echocardiogram pending  Laboratory Data:  High Sensitivity Troponin:   Recent Labs  Lab 05/07/19 0828  TROPONINIHS 20*     Chemistry Recent Labs  Lab 05/06/19 1808 05/07/19 0507  NA 138 139  K 3.3* 3.5  CL 99 98  CO2 24 26  GLUCOSE 136* 145*  BUN 15 19  CREATININE 1.18* 1.44*  CALCIUM 9.8 9.3  GFRNONAA 45* 35*  GFRAA 52* 41*  ANIONGAP 15 15    No results for input(s): PROT, ALBUMIN, AST, ALT, ALKPHOS, BILITOT in the last 168 hours. Hematology Recent Labs  Lab 05/06/19 1808 05/07/19 0507  WBC 13.0* 17.5*  RBC 4.09 4.06  HGB 12.3 12.2  HCT 36.5 37.0  MCV 89.2 91.1  MCH 30.1 30.0  MCHC 33.7 33.0  RDW 13.3 13.5  PLT 452* 409*   BNPNo results for input(s): BNP, PROBNP in the last 168 hours.  DDimer No results for input(s): DDIMER in the last 168 hours.   Radiology/Studies:  DG Chest 1 View  Result Date: 05/06/2019 CLINICAL DATA:  Recent fall with chest pain, initial encounter EXAM: CHEST  1 VIEW COMPARISON:  05/19/2013 FINDINGS: Cardiac shadow is enlarged. Thoracic aorta is tortuous and accentuated by mild patient rotation. The lungs are clear bilaterally. No acute bony abnormality is seen. IMPRESSION: Cardiomegaly and tortuous aorta.  No acute abnormality is seen. Electronically Signed   By: 05/21/2013 M.D.   On: 05/06/2019 19:03   DG Hip Unilat W or Wo Pelvis 2-3 Views Right  Result Date: 05/06/2019 CLINICAL DATA:  Right hip pain post fall. EXAM: DG HIP (WITH OR WITHOUT PELVIS) 2-3V RIGHT COMPARISON:  None. FINDINGS: There is a transverse severely displaced and impacted  subtrochanteric fracture of the proximal right femoral shaft. The right femoral head is normally located within the  acetabulum. IMPRESSION: Transverse severely displaced and impacted subtrochanteric fracture of the proximal right femoral shaft. Electronically Signed   By: Ted Mcalpine M.D.   On: 05/06/2019 19:06     Assessment and Plan:   Chest pain Atypical in nature, reports she has had symptoms in the past seems to come and go, no association with exertion -Less likely angina or unstable angina When she has the symptoms, she will hold her left lower ribs radiating into the left flank and left back, makes it feel better by massaging it -No recent ischemic work-up Prior work-up at [facility] including echocardiogram several years ago was normal -On this admission EKG essentially benign, Cardiac enzymes negative -Currently with left lower rib discomfort feels better with pushing on it and massage -No further ischemic work-up needed at this time -No strong indication to delay surgery for preop echocardiogram  Preop cardiovascular evaluation Atypical left lower rib left flank discomfort likely musculoskeletal -With that said she continues to smoke 1/2 pack up to 1 pack/day, has underlying COPD, AAA, Known coronary calcification seen on CT scan She would be at least moderate risk given her other comorbidities -Given she is not having unstable angina symptoms, no indication to delay hip surgery for ischemic work-up -She has been told in the past that she cannot be " put under", confirmed by family at the bedside.  On further discussion, there was presumed to be some concern about her COPD if she was on general anesthesia and intubated.  Hypotension Somewhat labile, likely associated with pain medication Would proceed cautiously with IV fluids perioperatively given high risk of respiratory complications in the setting of her COPD --With that said her BUN/creatinine slightly elevated  concerning for prerenal state  Hypokalemia Potassium tablet ordered  Leukocytosis Possibly reactive from hip, unable to exclude other process She does have chronic UTIs Appears the UA still pending    Total encounter time more than 110 minutes  Greater than 50% was spent in counseling and coordination of care with the patient  For questions or updates, please contact [facility] Please consult [email or web address] for contact info under     Signed, Julien Nordmann, MD  05/07/2019 9:24 AM

## 2019-05-08 ENCOUNTER — Inpatient Hospital Stay (HOSPITAL_COMMUNITY)
Admit: 2019-05-08 | Discharge: 2019-05-08 | Disposition: A | Discharge status: Home / Self Care | Payer: Medicare PPO | Attending: Orthopedic Surgery | Admitting: Orthopedic Surgery

## 2019-05-08 DIAGNOSIS — Z87440 Personal history of urinary (tract) infections: Secondary | ICD-10-CM

## 2019-05-08 DIAGNOSIS — K219 Gastro-esophageal reflux disease without esophagitis: Secondary | ICD-10-CM

## 2019-05-08 DIAGNOSIS — J432 Centrilobular emphysema: Secondary | ICD-10-CM

## 2019-05-08 DIAGNOSIS — I10 Essential (primary) hypertension: Secondary | ICD-10-CM

## 2019-05-08 DIAGNOSIS — Z720 Tobacco use: Secondary | ICD-10-CM

## 2019-05-08 DIAGNOSIS — W19XXXA Unspecified fall, initial encounter: Secondary | ICD-10-CM

## 2019-05-08 DIAGNOSIS — R079 Chest pain, unspecified: Secondary | ICD-10-CM

## 2019-05-08 DIAGNOSIS — S72001A Fracture of unspecified part of neck of right femur, initial encounter for closed fracture: Secondary | ICD-10-CM

## 2019-05-08 LAB — BASIC METABOLIC PANEL
Anion gap: 10 (ref 5–15)
BUN: 24 mg/dL — ABNORMAL HIGH (ref 8–23)
CO2: 27 mmol/L (ref 22–32)
Calcium: 8.5 mg/dL — ABNORMAL LOW (ref 8.9–10.3)
Chloride: 97 mmol/L — ABNORMAL LOW (ref 98–111)
Creatinine, Ser: 1.79 mg/dL — ABNORMAL HIGH (ref 0.44–1.00)
GFR calc Af Amer: 31 mL/min — ABNORMAL LOW (ref >60)
GFR calc non Af Amer: 27 mL/min — ABNORMAL LOW (ref >60)
Glucose, Bld: 96 mg/dL (ref 70–99)
Potassium: 4 mmol/L (ref 3.5–5.1)
Sodium: 134 mmol/L — ABNORMAL LOW (ref 135–145)

## 2019-05-08 LAB — ECHOCARDIOGRAM COMPLETE
Height: 60 in
Weight: 2504.43 oz

## 2019-05-08 LAB — CBC
HCT: 29.6 % — ABNORMAL LOW (ref 36.0–46.0)
Hemoglobin: 9.6 g/dL — ABNORMAL LOW (ref 12.0–15.0)
MCH: 29.9 pg (ref 26.0–34.0)
MCHC: 32.4 g/dL (ref 30.0–36.0)
MCV: 92.2 fL (ref 80.0–100.0)
Platelets: 315 10*3/uL (ref 150–400)
RBC: 3.21 MIL/uL — ABNORMAL LOW (ref 3.87–5.11)
RDW: 14 % (ref 11.5–15.5)
WBC: 13.9 10*3/uL — ABNORMAL HIGH (ref 4.0–10.5)
nRBC: 0 % (ref 0.0–0.2)

## 2019-05-08 MED ORDER — ACETAMINOPHEN 325 MG PO TABS
650.0000 mg | ORAL_TABLET | Freq: Four times a day (QID) | ORAL | Status: DC | PRN
  Administered 2019-05-08 (×2): 650 mg via ORAL
  Filled 2019-05-08 (×2): qty 2

## 2019-05-08 MED ORDER — TRAMADOL HCL 50 MG PO TABS
50.0000 mg | ORAL_TABLET | Freq: Two times a day (BID) | ORAL | Status: DC | PRN
  Administered 2019-05-09 – 2019-05-11 (×4): 50 mg via ORAL
  Filled 2019-05-08 (×5): qty 1

## 2019-05-08 NOTE — Plan of Care (Signed)

## 2019-05-08 NOTE — Progress Notes (Addendum)
    BRIEF OVERNIGHT PROGRESS REPORT  SUBJECTIVE: Per nursing staff "Patient in pain (Right Femur frx, POD1). Not able to give the patient Narcotics due to the blood pressure running low, current bp 95/63. If possible can she get a dose of Toradol IV?"  OBJECTIVE: On arrival to the ED, he was afebrile with blood pressure 104/53 mm Hg and pulse rate93 beats/min. There were no focal neurological deficits; she was alert and oriented x4, and he did not demonstrate any memory deficits.   BRIEF PATIENT DESCRIPTION: 79 y.o with PMH COPD, GERD, Aneurysm of descending thoracic aorta, HTN, recurrent UTI's admitted with right subtrochanteric fracture secondary to mechanical fall s/p ORIF # 1.  ASSESSMENT/PLAN: 1. Acute pain - secondary to closed displaced subtrochanteric right femur fracture - Pain likely under controlled with narcotics due to hypotension - Will add Tramadol alternate with low dose Vicodin and Morphine - Unable to give Toradol due to AKI - Will monitor BP with goal MAP>65 - IVFs and PRN bolus to keep MAP>49mmHg or SBP >37mmHg     Webb Silversmith, DNP, SCRN, CCRN, [facility]ist Nurse Practitioner Between 7pm to [place] - Pager [phone]

## 2019-05-08 NOTE — Evaluation (Signed)
Physical Therapy Evaluation Patient Details Name: Holly Shelton MRN: [id number] DOB: 12/27/1940 Today's Date: 05/08/2019   History of Present Illness  Per chart review: Pt is a 79 y/o F with a R subtrochanteric hip fx s/p IM nail. PMH includes: HTN, COPD, GERD, and AAA.    Clinical Impression  Pt pleasant and motivated to participate during the session. Pt was unable to actively move her R leg very much and required mod-max A with all bed mobility for her LE's along with min-mod A for her trunk. When sitting EOB, pt demonstrated appropriate strategies to stand up, but ultimately, she was unable to achieve upright standing this morning. Pt will benefit from PT services in a SNF setting upon discharge to safely address deficits listed in patient problem list for decreased caregiver assistance and eventual return to PLOF.     Follow Up Recommendations SNF    Equipment Recommendations  Rolling walker with 5" wheels;3in1 (PT)    Recommendations for Other Services       Precautions / Restrictions Precautions Precautions: Fall Restrictions Weight Bearing Restrictions: Yes RLE Weight Bearing: Weight bearing as tolerated      Mobility  Bed Mobility Overal bed mobility: Needs Assistance Bed Mobility: Supine to Sit;Sit to Supine;Rolling Rolling: Max assist(with cues for reaching for rails)   Supine to sit: Mod assist(mod assist for RLE and trunk control) Sit to supine: Max assist   General bed mobility comments: Max assist for BLE and min assist for trunk control  Transfers Overall transfer level: Needs assistance   Transfers: Lateral/Scoot Transfers          Lateral/Scoot Transfers: Mod assist General transfer comment: Pt attempted but unable to achieve standing but able to laterally scoot towards HOB with mod A  Ambulation/Gait                Stairs            Wheelchair Mobility    Modified Rankin (Stroke Patients Only)       Balance Overall balance  assessment: Needs assistance Sitting-balance support: Bilateral upper extremity supported;Feet unsupported Sitting balance-Leahy Scale: Poor Sitting balance - Comments: Pt relied on UE support to maintain upright sitting posture and had to quickly time moving hand positions to prevent LOB                                     Pertinent Vitals/Pain Pain Assessment: 0-10 Pain Score: 10-Worst pain ever Pain Location: Everywhere Pain Descriptors / Indicators: Aching;Sore Pain Intervention(s): Monitored during session;Premedicated before session    [place] expects to be discharged to:: Private residence Living Arrangements: Spouse/significant other Available Help at Discharge: Family;Available 24 hours/day(Husband works during day but daughter is available to help out as needed) Type of Home: Mobile home Home Access: Ramped entrance;Stairs to enter Entrance Stairs-Rails: Right;Left;Can reach both Entrance Stairs-Number of Steps: 3-4 Home Layout: One level Home Equipment: Grab bars - tub/shower;Walker - 4 wheels;Cane - single point      Prior Function Level of Independence: Needs assistance   Gait / Transfers Assistance Needed: [place] amb with a cane, pt states that she does not leave her home  ADL's / Homemaking Assistance Needed: Needs assistance for bathing and getting dressed  Comments: Denies falls in past six months other than most recent fall that resulted in this hospitalization     Hand Dominance  Extremity/Trunk Assessment        Lower Extremity Assessment Lower Extremity Assessment: Generalized weakness;RLE deficits/detail RLE: Unable to fully assess due to pain       Communication      Cognition Arousal/Alertness: Awake/alert Behavior During Therapy: WFL for tasks assessed/performed Overall Cognitive Status: Within Functional Limits for tasks assessed                                         General Comments      Exercises Total Joint Exercises Ankle Circles/Pumps: AROM;Strengthening;Both;10 reps Quad Sets: Strengthening;Both;10 reps Gluteal Sets: Strengthening;Both;10 reps Towel Squeeze: Strengthening;Both;10 reps Long Arc Quad: AROM;Strengthening;Both;10 reps(Small arc of motion, unable to achieve full ROM despite cueing) Other Exercises Other Exercises: Part-task standing training: anterior weight shifting in sitting/isometric sit-to-stand x5 Other Exercises: Extensive rolling training with cueing to reach for handrails to decrease necessary level of assistance   Assessment/Plan    PT Assessment Patient needs continued PT services  PT Problem List Decreased strength;Decreased mobility;Decreased safety awareness;Decreased range of motion;Decreased knowledge of precautions;Decreased activity tolerance;Decreased balance;Decreased knowledge of use of DME;Pain       PT Treatment Interventions DME instruction;Therapeutic exercise;Gait training;Balance training;Stair training;Neuromuscular re-education;Functional mobility training;Patient/family education;Therapeutic activities    PT Goals (Current goals can be found in the Care Plan section)  Acute Rehab PT Goals Patient Stated Goal: "Go home" PT Goal Formulation: With patient Time For Goal Achievement: 05/21/19 Potential to Achieve Goals: Good    Frequency BID   Barriers to discharge        Co-evaluation               AM-PAC PT "6 Clicks" Mobility  Outcome Measure Help needed turning from your back to your side while in a flat bed without using bedrails?: A Lot Help needed moving from lying on your back to sitting on the side of a flat bed without using bedrails?: A Lot Help needed moving to and from a bed to a chair (including a wheelchair)?: Total Help needed standing up from a chair using your arms (e.g., wheelchair or bedside chair)?: A Lot Help needed to walk in hospital room?: Total Help needed  climbing 3-5 steps with a railing? : Total 6 Click Score: 9    End of Session Equipment Utilized During Treatment: Gait belt;Oxygen Activity Tolerance: Patient tolerated treatment well Patient left: in bed;with nursing/sitter in room(Nurse Tech in room) Nurse Communication: Mobility status;Weight bearing status PT Visit Diagnosis: Unsteadiness on feet (R26.81);Muscle weakness (generalized) (M62.81);Pain;Other abnormalities of gait and mobility (R26.89) Pain - Right/Left: Right    Time: 0950-1040 PT Time Calculation (min) (ACUTE ONLY): 50 min   Charges:              Veleta Miners, SPT 05/08/19 1:25 PM

## 2019-05-08 NOTE — Progress Notes (Signed)
PT Cancellation Note  Patient Details Name: Holly Shelton MRN: [id number] DOB: 1940/10/05   Cancelled Treatment:    Reason Eval/Treat Not Completed: Patient declined PM physical therapy session secondary to R hip pain.  Offered supine therex to pt's tolerance with pt again refusing to participate, nursing notified.  Will attempt to see pt at a future date/time as medically appropriate.     Ovidio Hanger PT, DPT 05/08/19, 2:42 PM

## 2019-05-08 NOTE — NC FL2 (Signed)
[place] MEDICAID FL2 LEVEL OF CARE SCREENING TOOL     IDENTIFICATION  Patient Name: Holly Shelton Birthdate: 10/14/1940 Sex: female Admission Date (Current Location): 05/06/2019  [employer] [facility] and [place] Number:  Chiropodist and Address:  [facility], [address], [place], [place] [address]      Provider Number: [id number]  Attending Physician Name and Address:  Arnetha Courser, MD  Relative Name and Phone Number:  Alyrica Thurow [phone]    Current Level of Care: Hospital Recommended Level of Care: Skilled Nursing Facility Prior Approval Number:    Date Approved/Denied:   PASRR Number: [id number]  Discharge Plan: SNF    Current Diagnoses: Patient Active Problem List   Diagnosis Date Noted  . Fall   . Closed displaced subtrochanteric fracture of right femur (HCC) 05/06/2019  . COPD (chronic obstructive pulmonary disease) (HCC) 05/06/2019  . Essential hypertension 05/06/2019  . Tobacco use 05/06/2019  . History of recurrent UTI (urinary tract infection) 05/06/2019  . GERD (gastroesophageal reflux disease) 05/06/2019  . Aneurysm of descending thoracic aorta (HCC) 05/06/2019  . Closed fracture of right hip (HCC) 05/06/2019    Orientation RESPIRATION BLADDER Height & Weight     Self, Time, Situation, Place  Normal Continent Weight: 71 kg Height:  5' (152.4 cm)  BEHAVIORAL SYMPTOMS/MOOD NEUROLOGICAL BOWEL NUTRITION STATUS      Continent    AMBULATORY STATUS COMMUNICATION OF NEEDS Skin   Extensive Assist Verbally Surgical wounds                       Personal Care Assistance Level of Assistance  Bathing, Dressing Bathing Assistance: Limited assistance   Dressing Assistance: Limited assistance     Functional Limitations Info             SPECIAL CARE FACTORS FREQUENCY  PT (By licensed PT)     PT Frequency: 5 times per week              Contractures Contractures Info: Not present    Additional Factors  Info  Code Status, Allergies Code Status Info: Full Code Allergies Info: Penicillins           Current Medications (05/08/2019):  This is the current hospital active medication list Current Facility-Administered Medications  Medication Dose Route Frequency Provider Last Rate Last Admin  . 0.9 %  sodium chloride infusion   Intravenous Continuous Kennedy Bucker, MD 75 mL/hr at 05/08/19 0544 New Bag at 05/08/19 0544  . acetaminophen (TYLENOL) tablet 650 mg  650 mg Oral Q6H PRN Arnetha Courser, MD   650 mg at 05/08/19 1125  . albuterol (VENTOLIN HFA) 108 (90 Base) MCG/ACT inhaler 2 puff  2 puff Inhalation Q6H PRN Kennedy Bucker, MD      . alum & mag hydroxide-simeth (MAALOX/MYLANTA) 200-200-20 MG/5ML suspension 30 mL  30 mL Oral Q4H PRN Kennedy Bucker, MD      . amLODipine (NORVASC) tablet 5 mg  5 mg Oral Daily Arnetha Courser, MD      . aspirin EC tablet 81 mg  81 mg Oral QHS Kennedy Bucker, MD   81 mg at 05/07/19 2210  . B-complex with vitamin C tablet 1 tablet  1 tablet Oral Daily Kennedy Bucker, MD   1 tablet at 05/08/19 0844  . bisacodyl (DULCOLAX) suppository 10 mg  10 mg Rectal Daily PRN Kennedy Bucker, MD      . calcium acetate (Phos Binder) [facility]) 667 MG/5ML oral solution 667 mg  667 mg Oral BID Kennedy Bucker, MD   667 mg at 05/08/19 0844  . calcium-vitamin D (OSCAL WITH D) 500-200 MG-UNIT per tablet 1 tablet  1 tablet Oral Daily Kennedy Bucker, MD   1 tablet at 05/08/19 [phone]  . Chlorhexidine Gluconate Cloth 2 % PADS 6 each  6 each Topical Daily Dhungel, Nishant, MD      . cholecalciferol (VITAMIN D3) tablet 1,000 Units  1,000 Units Oral Daily Kennedy Bucker, MD   1,000 Units at 05/08/19 [phone]  . docusate sodium (COLACE) capsule 100 mg  100 mg Oral BID Kennedy Bucker, MD   100 mg at 05/08/19 0843  . enoxaparin (LOVENOX) injection 30 mg  30 mg Subcutaneous Q24H Lowella Bandy, RPH   30 mg at 05/08/19 0347  . feeding supplement (ENSURE ENLIVE) (ENSURE ENLIVE) liquid 237 mL  237 mL Oral BID BM  Dhungel, Nishant, MD   237 mL at 05/08/19 1352  . HYDROcodone-acetaminophen (NORCO/VICODIN) 5-325 MG per tablet 1-2 tablet  1-2 tablet Oral Q6H PRN Kennedy Bucker, MD   1 tablet at 05/07/19 0425  . magnesium citrate solution 1 Bottle  1 Bottle Oral Once PRN Kennedy Bucker, MD      . magnesium hydroxide (MILK OF MAGNESIA) suspension 30 mL  30 mL Oral Daily PRN Kennedy Bucker, MD      . menthol-cetylpyridinium (CEPACOL) lozenge 3 mg  1 lozenge Oral PRN Kennedy Bucker, MD       Or  . phenol (CHLORASEPTIC) mouth spray 1 spray  1 spray Mouth/Throat PRN Kennedy Bucker, MD      . metoCLOPramide (REGLAN) tablet 5-10 mg  5-10 mg Oral Q8H PRN Kennedy Bucker, MD       Or  . metoCLOPramide (REGLAN) injection 5-10 mg  5-10 mg Intravenous Q8H PRN Kennedy Bucker, MD      . mometasone-formoterol [facility]) 200-5 MCG/ACT inhaler 2 puff  2 puff Inhalation BID Kennedy Bucker, MD   2 puff at 05/08/19 0844  . morphine 2 MG/ML injection 2 mg  2 mg Intravenous Q3H PRN Kennedy Bucker, MD   2 mg at 05/07/19 1828  . nicotine (NICODERM CQ - dosed in mg/24 hours) patch 14 mg  14 mg Transdermal Daily Kennedy Bucker, MD      . ondansetron [facility]) tablet 4 mg  4 mg Oral Q6H PRN Kennedy Bucker, MD       Or  . ondansetron [facility]) injection 4 mg  4 mg Intravenous Q6H PRN Kennedy Bucker, MD      . ondansetron [facility]) tablet 4 mg  4 mg Oral Q6H PRN Kennedy Bucker, MD       Or  . ondansetron [facility]) injection 4 mg  4 mg Intravenous Q6H PRN Kennedy Bucker, MD      . pantoprazole (PROTONIX) EC tablet 40 mg  40 mg Oral Daily Kennedy Bucker, MD   40 mg at 05/08/19 0843  . propranolol (INDERAL) tablet 40 mg  40 mg Oral TID Kennedy Bucker, MD   40 mg at 05/07/19 1604  . rosuvastatin (CRESTOR) tablet 10 mg  10 mg Oral QHS Kennedy Bucker, MD   10 mg at 05/07/19 2154  . senna-docusate (Senokot-S) tablet 1 tablet  1 tablet Oral QHS PRN Kennedy Bucker, MD      . tiotropium [facility]) inhalation capsule ([facility] use ONLY) 18 mcg  1 capsule Inhalation Daily  Kennedy Bucker, MD   18 mcg at 05/08/19 4259     Discharge Medications: Please see discharge summary for a  list of discharge medications.  Relevant Imaging Results:  Relevant Lab Results:   Additional Information SS3 [id number]  Su Hilt, RN

## 2019-05-08 NOTE — Progress Notes (Addendum)
   Subjective: 1 Day Post-Op Procedure(s) (LRB): INTRAMEDULLARY (IM) NAIL INTERTROCHANTRIC (Right) Patient reports pain as 10 on 0-10 scale.   Patient is well, and has had no acute complaints or problems Denies any CP, SOB, ABD pain. We will continue therapy today.  Plan is to go Skilled nursing facility after hospital stay.  Objective: Vital signs in last 24 hours: Temp:  [97.4 F (36.3 C)-98.7 F (37.1 C)] 97.8 F (36.6 C) (04/05 0741) Pulse Rate:  [64-80] 73 (04/05 0741) Resp:  [11-18] 16 (04/05 0741) BP: (74-128)/(45-72) 87/57 (04/05 0741) SpO2:  [96 %-100 %] 100 % (04/05 0741)  Intake/Output from previous day: 04/04 0701 - 04/05 0700 In: 1877.1 [P.O.:240; I.V.:1587.1; IV Piggyback:50] Out: 700 [Urine:450; Blood:250] Intake/Output this shift: No intake/output data recorded.  Recent Labs    05/06/19 1808 05/07/19 0507 05/08/19 0419  HGB 12.3 12.2 9.6*   Recent Labs    05/07/19 0507 05/08/19 0419  WBC 17.5* 13.9*  RBC 4.06 3.21*  HCT 37.0 29.6*  PLT 409* 315   Recent Labs    05/07/19 0507 05/08/19 0419  NA 139 134*  K 3.5 4.0  CL 98 97*  CO2 26 27  BUN 19 24*  CREATININE 1.44* 1.79*  GLUCOSE 145* 96  CALCIUM 9.3 8.5*   No results for input(s): LABPT, INR in the last 72 hours.  EXAM General - Patient is Alert, Appropriate and Oriented Extremity - Neurovascular intact Sensation intact distally Intact pulses distally Dorsiflexion/Plantar flexion intact No cellulitis present Compartment soft Dressing - moderate drainage Motor Function - intact, moving foot and toes well on exam.   Past Medical History:  Diagnosis Date  . Hypertension     Assessment/Plan:   1 Day Post-Op Procedure(s) (LRB): INTRAMEDULLARY (IM) NAIL INTERTROCHANTRIC (Right) Active Problems:   Closed displaced subtrochanteric fracture of right femur (HCC)   COPD (chronic obstructive pulmonary disease) (HCC)   Essential hypertension   Tobacco use   History of recurrent UTI  (urinary tract infection)   GERD (gastroesophageal reflux disease)   Aneurysm of descending thoracic aorta (HCC)   Closed right hip fracture, initial encounter (HCC)   Acute post op blood loss anemia   Estimated body mass index is 30.57 kg/m as calculated from the following:   Height as of this encounter: 5' (1.524 m).   Weight as of this encounter: 71 kg. Advance diet Up with therapy  Work on bowel movement. Acute post op blood loss anemia - Hgb 9.6. Recheck Hgb in the am Pain severe.  Pain medications have been limited due to hypotension. Incision site appears well, will change dressing tomorrow. Care management to assist with discharge.  DVT Prophylaxis - Lovenox, TED hose and SCDs Weight-Bearing as tolerated to right leg   T. Cranston Neighbor, PA-C [facility] 05/08/2019, 8:13 AM

## 2019-05-08 NOTE — TOC Progression Note (Signed)
Transition of Care [facility]) - Progression Note    Patient Details  Name: Holly Shelton MRN: [id number] Date of Birth: 05-15-1940  Transition of [facility]) CM/SW Contact  Barrie Dunker, RN Phone Number: 05/08/2019, 3:08 PM  Clinical Narrative:     Patient lives at home with her husband, he works during the day.  Her daughter is available to help, she did not do well with PT and it is recommended that she go to SNF for rehab, FL2, PASSR completed and bed search completed.         Expected Discharge Plan and Services                                                 Social Determinants of Health (SDOH) Interventions    Readmission Risk Interventions No flowsheet data found.

## 2019-05-08 NOTE — Anesthesia Postprocedure Evaluation (Signed)
Anesthesia Post Note  Patient: Holly Shelton  Procedure(s) Performed: INTRAMEDULLARY (IM) NAIL INTERTROCHANTRIC (Right Hip)  Patient location during evaluation: Nursing Unit Anesthesia Type: Spinal Level of consciousness: oriented and awake and alert Pain management: pain level controlled Vital Signs Assessment: post-procedure vital signs reviewed and stable Respiratory status: spontaneous breathing and respiratory function stable Cardiovascular status: blood pressure returned to baseline and stable Postop Assessment: no headache, no backache, no apparent nausea or vomiting and patient able to bend at knees Anesthetic complications: no     Last Vitals:  Vitals:   05/08/19 0555 05/08/19 0741  BP: (!) 87/50 (!) 87/57  Pulse: 75 73  Resp:  16  Temp:  36.6 C  SpO2:  100%    Last Pain:  Vitals:   05/08/19 0741  TempSrc: Oral  PainSc:                  Rosanne Gutting

## 2019-05-08 NOTE — Progress Notes (Addendum)
Pt blood pressure is still running low. 87/48. MD made aware. No new orders at received at this time.

## 2019-05-08 NOTE — Progress Notes (Signed)
*  PRELIMINARY RESULTS* Echocardiogram 2D Echocardiogram has been performed.  Joanette Gula Cellie Dardis 05/08/2019, 9:26 AM

## 2019-05-08 NOTE — Progress Notes (Signed)
PROGRESS NOTE    Holly Shelton  DXI:[id number] DOB: [birth year] DOA: 05/06/2019 PCP: Patient, No Pcp Per   Brief Narrative:  79 year old female with hypertension, COPD with active tobacco use, GERD, AAA of descending thoracic aorta and recurrent UTIs on nitrofurantoin presented to the ED with mechanical fall and sustained right hip pain. In the ED x-ray of the hip showed transverse displaced and impacted right subtrochanteric fracture. Blood work showed mild AKI with creatinine of 1.18. Admitted for further management and taken to the OR yesterday.  Patient tolerated the procedure well and will go to SNF as soon as bed is available.  Subjective: Patient continued to experience pain.  Availability of pain meds were limited today due to hypotension.  Denies any dizziness, dyspnea or chest pain.  Assessment & Plan:   Active Problems:   Closed displaced subtrochanteric fracture of right femur (HCC)   COPD (chronic obstructive pulmonary disease) (HCC)   Essential hypertension   Tobacco use   History of recurrent UTI (urinary tract infection)   GERD (gastroesophageal reflux disease)   Aneurysm of descending thoracic aorta (HCC)   Closed right hip fracture, initial encounter (HCC)  Closed displaced subtrochanteric right femur fracture (HCC) Secondary to mechanical fall.  Taken for ORIF yesterday. -Problem controlling pain with narcotic due to hypotension. -Add Tylenol and see if that will help with her pain. -Continue with as needed Vicodin if blood pressure permits. -PT/OT evaluation for placement.  Essential hypertension.  Blood pressure soft this morning.  Patient denies any chest pain or dizziness this morning.  Most likely postsurgical.  -Hold home antihypertensives. -Continue with normal saline today. -Careful with pain medications. -We will use bolus as needed.  Anemia secondary to blood loss.  Hemoglobin dropped to 9.6 postoperatively, most likely contributing to her  hypotension. -Monitor CBC. -Transfuse if below 7.  Chest pain.  Patient experienced some left-sided lower chest pain yesterday which was thought to be due to musculoskeletal.  EKG without any acute change and troponin remain negative. -Echo was ordered-pending results. -Continue to monitor.  AKI.  Worsening creatinine at 1.79 today. -Continue to monitor with gentle IV fluid. -Avoid nephrotoxins.  Descending thoracic aortic aneurysm Last CT of the chest from 9///2019 at [facility] showing aneurysmal dilatation measuring 5.2 x 5.8 cm.  Reportedly being followed by cardiothoracic surgery at [facility].  COPD Currently stable.  Active tobacco use and counseled on cessation.  -Continue home bronchodilators.  - Nicotine patch.  GERD Continue PPI   History of recurrent UTI. On chronic nitrofurantoin, held due to AKI.  Objective: Vitals:   05/08/19 0500 05/08/19 0555 05/08/19 0741 05/08/19 1150  BP: (!) 86/50 (!) 87/50 (!) 87/57 (!) 96/47  Pulse: 64 75 73 80  Resp:   16 16  Temp:   97.8 F (36.6 C) (!) 97.5 F (36.4 C)  TempSrc:   Oral Oral  SpO2:   100% 94%  Weight:      Height:        Intake/Output Summary (Last 24 hours) at 05/08/2019 1253 Last data filed at 05/08/2019 0950 Gross per 24 hour  Intake 427.09 ml  Output 400 ml  Net 27.09 ml   Filed Weights   05/06/19 1806 05/07/19 0040  Weight: 66.8 kg 71 kg    Examination:  General exam: Appears calm and comfortable  Respiratory system: Clear to auscultation. Respiratory effort normal. Cardiovascular system: S1 & S2 heard, RRR. No JVD, murmurs, rubs, gallops or clicks. Gastrointestinal system: Soft, nontender, nondistended, bowel sounds positive. Central  nervous system: Alert and oriented. No focal neurological deficits.Symmetric 5 x 5 power. Extremities: No edema, no cyanosis, pulses intact and symmetrical. Psychiatry: Judgement and insight appear normal.    DVT prophylaxis: Lovenox Code Status: Full Family Communication:  Husband was updated at bedside. Disposition Plan: Pending improvement.  Patient is currently hypotensive postoperatively.  PT evaluation pending for SNF placement.  Consultants:   Orthopedic  Procedures:  Antimicrobials:   Data Reviewed: I have personally reviewed following labs and imaging studies  CBC: Recent Labs  Lab 05/06/19 1808 05/07/19 0507 05/08/19 0419  WBC 13.0* 17.5* 13.9*  NEUTROABS 10.3*  --   --   HGB 12.3 12.2 9.6*  HCT 36.5 37.0 29.6*  MCV 89.2 91.1 92.2  PLT 452* 409* 315   Basic Metabolic Panel: Recent Labs  Lab 05/06/19 1808 05/07/19 0507 05/08/19 0419  NA 138 139 134*  K 3.3* 3.5 4.0  CL 99 98 97*  CO2 24 26 27   GLUCOSE 136* 145* 96  BUN 15 19 24*  CREATININE 1.18* 1.44* 1.79*  CALCIUM 9.8 9.3 8.5*   GFR: Estimated Creatinine Clearance: 23.5 mL/min (A) (by C-G formula based on SCr of 1.79 mg/dL (H)). Liver Function Tests: No results for input(s): AST, ALT, ALKPHOS, BILITOT, PROT, ALBUMIN in the last 168 hours. No results for input(s): LIPASE, AMYLASE in the last 168 hours. No results for input(s): AMMONIA in the last 168 hours. Coagulation Profile: No results for input(s): INR, PROTIME in the last 168 hours. Cardiac Enzymes: No results for input(s): CKTOTAL, CKMB, CKMBINDEX, TROPONINI in the last 168 hours. BNP (last 3 results) No results for input(s): PROBNP in the last 8760 hours. HbA1C: No results for input(s): HGBA1C in the last 72 hours. CBG: No results for input(s): GLUCAP in the last 168 hours. Lipid Profile: No results for input(s): CHOL, HDL, LDLCALC, TRIG, CHOLHDL, LDLDIRECT in the last 72 hours. Thyroid Function Tests: No results for input(s): TSH, T4TOTAL, FREET4, T3FREE, THYROIDAB in the last 72 hours. Anemia Panel: No results for input(s): VITAMINB12, FOLATE, FERRITIN, TIBC, IRON, RETICCTPCT in the last 72 hours. Sepsis Labs: No results for input(s): PROCALCITON, LATICACIDVEN in the last 168 hours.  Recent Results  (from the past 240 hour(s))  Respiratory Panel by RT PCR (Flu A&B, Covid) - Nasopharyngeal Swab     Status: None   Collection Time: 05/06/19  7:16 PM   Specimen: Nasopharyngeal Swab  Result Value Ref Range Status   SARS Coronavirus 2 by RT PCR NEGATIVE NEGATIVE Final    Comment: (NOTE) SARS-CoV-2 target nucleic acids are NOT DETECTED. The SARS-CoV-2 RNA is generally detectable in upper respiratoy specimens during the acute phase of infection. The lowest concentration of SARS-CoV-2 viral copies this assay can detect is 131 copies/mL. A negative result does not preclude SARS-Cov-2 infection and should not be used as the sole basis for treatment or other patient management decisions. A negative result may occur with  improper specimen collection/handling, submission of specimen other than nasopharyngeal swab, presence of viral mutation(s) within the areas targeted by this assay, and inadequate number of viral copies (<131 copies/mL). A negative result must be combined with clinical observations, patient history, and epidemiological information. The expected result is Negative. Fact Sheet for Patients:  07/06/19 Fact Sheet for Healthcare Providers:  https://www.moore.com/ This test is not yet ap proved or cleared by the https://www.young.biz/ FDA and  has been authorized for detection and/or diagnosis of SARS-CoV-2 by FDA under an Emergency Use Authorization (EUA). This EUA will remain  in effect (meaning this test can be used) for the duration of the COVID-19 declaration under Section 564(b)(1) of the Act, 21 U.S.C. section 360bbb-3(b)(1), unless the authorization is terminated or revoked sooner.    Influenza A by PCR NEGATIVE NEGATIVE Final   Influenza B by PCR NEGATIVE NEGATIVE Final    Comment: (NOTE) The Xpert Xpress SARS-CoV-2/FLU/RSV assay is intended as an aid in  the diagnosis of influenza from Nasopharyngeal swab specimens and    should not be used as a sole basis for treatment. Nasal washings and  aspirates are unacceptable for Xpert Xpress SARS-CoV-2/FLU/RSV  testing. Fact Sheet for Patients: https://www.moore.com/ Fact Sheet for Healthcare Providers: https://www.young.biz/ This test is not yet approved or cleared by the [place] FDA and  has been authorized for detection and/or diagnosis of SARS-CoV-2 by  FDA under an Emergency Use Authorization (EUA). This EUA will remain  in effect (meaning this test can be used) for the duration of the  Covid-19 declaration under Section 564(b)(1) of the Act, 21  U.S.C. section 360bbb-3(b)(1), unless the authorization is  terminated or revoked. Performed at [facility] [place] [facility], [address] [place], [place] [address]   MRSA PCR Screening     Status: None   Collection Time: 05/07/19  4:23 AM   Specimen: Nasal Mucosa; Nasopharyngeal  Result Value Ref Range Status   MRSA by PCR NEGATIVE NEGATIVE Final    Comment:        The GeneXpert MRSA Assay (FDA approved for NASAL specimens only), is one component of a comprehensive MRSA colonization surveillance program. It is not intended to diagnose MRSA infection nor to guide or monitor treatment for MRSA infections. Performed at [facility], [address] [place], [place] [address]      Radiology Studies: DG Chest 1 View  Result Date: 05/06/2019 CLINICAL DATA:  Recent fall with chest pain, initial encounter EXAM: CHEST  1 VIEW COMPARISON:  05/19/2013 FINDINGS: Cardiac shadow is enlarged. Thoracic aorta is tortuous and accentuated by mild patient rotation. The lungs are clear bilaterally. No acute bony abnormality is seen. IMPRESSION: Cardiomegaly and tortuous aorta.  No acute abnormality is seen. Electronically Signed   By: Alcide Clever M.D.   On: 05/06/2019 19:03   DG HIP OPERATIVE UNILAT W OR W/O PELVIS RIGHT  Result Date: 05/07/2019 CLINICAL DATA:  Fracture  fixation. Subtrochanteric fracture of the right hip. EXAM: OPERATIVE right HIP (WITH PELVIS IF PERFORMED) 6 VIEWS TECHNIQUE: Fluoroscopic spot image(s) were submitted for interpretation post-operatively. COMPARISON:  Radiographs 05/06/2019 FINDINGS: Multiple intraoperative fluoroscopic spot images demonstrate placement of a long intramedullary gamma nail and the proximal dynamic hip screw and single distal interlocking screw. Good position and alignment of the fracture with anatomic reduction. IMPRESSION: Anatomic reduction of the right hip fracture with internal fixation. Electronically Signed   By: Rudie Meyer M.D.   On: 05/07/2019 12:45   DG Hip Unilat W or Wo Pelvis 2-3 Views Right  Result Date: 05/06/2019 CLINICAL DATA:  Right hip pain post fall. EXAM: DG HIP (WITH OR WITHOUT PELVIS) 2-3V RIGHT COMPARISON:  None. FINDINGS: There is a transverse severely displaced and impacted subtrochanteric fracture of the proximal right femoral shaft. The right femoral head is normally located within the acetabulum. IMPRESSION: Transverse severely displaced and impacted subtrochanteric fracture of the proximal right femoral shaft. Electronically Signed   By: Ted Mcalpine M.D.   On: 05/06/2019 19:06    Scheduled Meds: . amLODipine  5 mg Oral Daily  . aspirin EC  81 mg Oral QHS  . B-complex with vitamin C  1 tablet Oral Daily  . calcium acetate (Phos Binder)  667 mg Oral BID  . calcium-vitamin D  1 tablet Oral Daily  . Chlorhexidine Gluconate Cloth  6 each Topical Daily  . cholecalciferol  1,000 Units Oral Daily  . docusate sodium  100 mg Oral BID  . enoxaparin (LOVENOX) injection  30 mg Subcutaneous Q24H  . feeding supplement (ENSURE ENLIVE)  237 mL Oral BID BM  . mometasone-formoterol  2 puff Inhalation BID  . nicotine  14 mg Transdermal Daily  . pantoprazole  40 mg Oral Daily  . propranolol  40 mg Oral TID  . rosuvastatin  10 mg Oral QHS  . tiotropium  1 capsule Inhalation Daily   Continuous  Infusions: . sodium chloride 75 mL/hr at 05/08/19 0544     LOS: 2 days   Time spent: 40 minutes.  Lorella Nimrod, MD [facility]  If 7PM-7AM, please contact night-coverage [email or web address]  05/08/2019, 12:53 PM   This record has been created using Systems analyst. Errors have been sought and corrected,but may not always be located. Such creation errors do not reflect on the standard of care.

## 2019-05-09 ENCOUNTER — Inpatient Hospital Stay: Payer: Medicare PPO

## 2019-05-09 LAB — CBC
HCT: 26.1 % — ABNORMAL LOW (ref 36.0–46.0)
Hemoglobin: 8.8 g/dL — ABNORMAL LOW (ref 12.0–15.0)
MCH: 30.7 pg (ref 26.0–34.0)
MCHC: 33.7 g/dL (ref 30.0–36.0)
MCV: 90.9 fL (ref 80.0–100.0)
Platelets: 302 10*3/uL (ref 150–400)
RBC: 2.87 MIL/uL — ABNORMAL LOW (ref 3.87–5.11)
RDW: 13.9 % (ref 11.5–15.5)
WBC: 12.6 10*3/uL — ABNORMAL HIGH (ref 4.0–10.5)
nRBC: 0 % (ref 0.0–0.2)

## 2019-05-09 LAB — BASIC METABOLIC PANEL
Anion gap: 9 (ref 5–15)
BUN: 26 mg/dL — ABNORMAL HIGH (ref 8–23)
CO2: 24 mmol/L (ref 22–32)
Calcium: 9.1 mg/dL (ref 8.9–10.3)
Chloride: 102 mmol/L (ref 98–111)
Creatinine, Ser: 1.41 mg/dL — ABNORMAL HIGH (ref 0.44–1.00)
GFR calc Af Amer: 42 mL/min — ABNORMAL LOW (ref >60)
GFR calc non Af Amer: 36 mL/min — ABNORMAL LOW (ref >60)
Glucose, Bld: 92 mg/dL (ref 70–99)
Potassium: 4 mmol/L (ref 3.5–5.1)
Sodium: 135 mmol/L (ref 135–145)

## 2019-05-09 MED ORDER — SODIUM CHLORIDE 0.9 % IV BOLUS
500.0000 mL | Freq: Once | INTRAVENOUS | Status: AC
  Administered 2019-05-09: 500 mL via INTRAVENOUS

## 2019-05-09 MED ORDER — SODIUM CHLORIDE 0.9 % IV BOLUS
500.0000 mL | Freq: Once | INTRAVENOUS | Status: AC
  Administered 2019-05-09: 09:00:00 500 mL via INTRAVENOUS

## 2019-05-09 MED ORDER — FE FUMARATE-B12-VIT C-FA-IFC PO CAPS
1.0000 | ORAL_CAPSULE | Freq: Two times a day (BID) | ORAL | Status: DC
  Administered 2019-05-09 – 2019-05-11 (×5): 1 via ORAL
  Filled 2019-05-09 (×6): qty 1

## 2019-05-09 NOTE — Progress Notes (Signed)
PROGRESS NOTE    Holly Shelton  LNL:[id number] DOB: 1940/09/06 DOA: 05/06/2019 PCP: Patient, No Pcp Per   Brief Narrative:  79 year old female with hypertension, COPD with active tobacco use, GERD, AAA of descending thoracic aorta and recurrent UTIs on nitrofurantoin presented to the ED with mechanical fall and sustained right hip pain. In the ED x-ray of the hip showed transverse displaced and impacted right subtrochanteric fracture. Blood work showed mild AKI with creatinine of 1.18. Admitted for further management and taken to the OR yesterday.  Patient tolerated the procedure well and will go to SNF as soon as bed is available.  Subjective: Patient continued to experience pain.  Denies any dizziness.  She was accompanied by her husband in the room who said that she always have softer blood pressure.    Assessment & Plan:   Active Problems:   Closed displaced subtrochanteric fracture of right femur (HCC)   COPD (chronic obstructive pulmonary disease) (HCC)   Essential hypertension   Tobacco use   History of recurrent UTI (urinary tract infection)   GERD (gastroesophageal reflux disease)   Aneurysm of descending thoracic aorta (HCC)   Closed fracture of right hip (HCC)   Fall  Closed displaced subtrochanteric right femur fracture (HCC) Secondary to mechanical fall.  Taken for ORIF yesterday. -Problem controlling pain with narcotic due to hypotension. -Add Tylenol and see if that will help with her pain. -Continue with as needed Vicodin if blood pressure permits. -PT/OT evaluation -pending SNF placement.  Social worker have started the search for bed and insurance authorization.  Essential hypertension.  Blood pressure remains soft.  Patient denies any chest pain or dizziness this morning.  Her husband she always have softer blood pressure at home with mostly systolic in 90s.  Patient was on amlodipine and propranolol at home which were discontinued.  Might not need them on  discharge. Received 500 cc bolus with improvement of blood pressure to 120. -D/C home antihypertensives. -Careful with pain medications. -We will use bolus as needed.  Anemia secondary to blood loss.  Hemoglobin dropped to 9.6 postoperatively, further decreased to 8.8 today.  Got hip x-ray to rule out any hematoma which was negative for any blood collection.  -Monitor CBC. -Start her on iron supplement. -Transfuse if below 7.  Chest pain.  Patient experienced some left-sided lower chest pain 2 days ago which was thought to be due to musculoskeletal.  EKG without any acute change and troponin remain negative. -Echo was ordered-EF of 55 to 60% with grade 1 diastolic dysfunction. -Continue to monitor.  AKI.  Creatinine improving, 1.41 today. -Continue to monitor with gentle IV fluid. -Avoid nephrotoxins.  Descending thoracic aortic aneurysm Last CT of the chest from 9///2019 at [facility] showing aneurysmal dilatation measuring 5.2 x 5.8 cm.  Reportedly being followed by cardiothoracic surgery at [facility].  COPD Currently stable.  Active tobacco use and counseled on cessation.  -Continue home bronchodilators.  - Nicotine patch.  GERD Continue PPI   History of recurrent UTI. On chronic nitrofurantoin, held due to AKI.  Objective: Vitals:   05/09/19 0405 05/09/19 0406 05/09/19 0753 05/09/19 1508  BP: (!) 93/48  (!) 83/53 (!) 101/42  Pulse: 71 72 73 80  Resp: 17   16  Temp: 98.5 F (36.9 C)  98.4 F (36.9 C)   TempSrc:   Oral   SpO2: (!) 88% 92% (!) 89% 94%  Weight:      Height:        Intake/Output Summary (Last  24 hours) at 05/09/2019 1536 Last data filed at 05/09/2019 0500 Gross per 24 hour  Intake 1065 ml  Output --  Net 1065 ml   Filed Weights   05/06/19 1806 05/07/19 0040  Weight: 66.8 kg 71 kg    Examination:  General exam: Appears calm and comfortable  Respiratory system: Clear to auscultation. Respiratory effort normal. Cardiovascular system: S1 & S2 heard, RRR.  No JVD, murmurs, rubs, gallops or clicks. Gastrointestinal system: Soft, nontender, nondistended, bowel sounds positive. Central nervous system: Alert and oriented. No focal neurological deficits.Symmetric 5 x 5 power. Extremities: No edema, no cyanosis, pulses intact and symmetrical. Psychiatry: Judgement and insight appear normal.    DVT prophylaxis: Lovenox Code Status: Full Family Communication: Husband was updated at bedside. Disposition Plan: Pending improvement.  Patient with persistent hypotension which interfered with proper pain management.  PT is recommending SNF placement.  Social worker started the bed search.  Consultants:   Orthopedic  Procedures:  Antimicrobials:   Data Reviewed: I have personally reviewed following labs and imaging studies  CBC: Recent Labs  Lab 05/06/19 1808 05/07/19 0507 05/08/19 0419 05/09/19 0511  WBC 13.0* 17.5* 13.9* 12.6*  NEUTROABS 10.3*  --   --   --   HGB 12.3 12.2 9.6* 8.8*  HCT 36.5 37.0 29.6* 26.1*  MCV 89.2 91.1 92.2 90.9  PLT 452* 409* 315 302   Basic Metabolic Panel: Recent Labs  Lab 05/06/19 1808 05/07/19 0507 05/08/19 0419 05/09/19 0511  NA 138 139 134* 135  K 3.3* 3.5 4.0 4.0  CL 99 98 97* 102  CO2 24 26 27 24   GLUCOSE 136* 145* 96 92  BUN 15 19 24* 26*  CREATININE 1.18* 1.44* 1.79* 1.41*  CALCIUM 9.8 9.3 8.5* 9.1   GFR: Estimated Creatinine Clearance: 29.8 mL/min (A) (by C-G formula based on SCr of 1.41 mg/dL (H)). Liver Function Tests: No results for input(s): AST, ALT, ALKPHOS, BILITOT, PROT, ALBUMIN in the last 168 hours. No results for input(s): LIPASE, AMYLASE in the last 168 hours. No results for input(s): AMMONIA in the last 168 hours. Coagulation Profile: No results for input(s): INR, PROTIME in the last 168 hours. Cardiac Enzymes: No results for input(s): CKTOTAL, CKMB, CKMBINDEX, TROPONINI in the last 168 hours. BNP (last 3 results) No results for input(s): PROBNP in the last 8760  hours. HbA1C: No results for input(s): HGBA1C in the last 72 hours. CBG: No results for input(s): GLUCAP in the last 168 hours. Lipid Profile: No results for input(s): CHOL, HDL, LDLCALC, TRIG, CHOLHDL, LDLDIRECT in the last 72 hours. Thyroid Function Tests: No results for input(s): TSH, T4TOTAL, FREET4, T3FREE, THYROIDAB in the last 72 hours. Anemia Panel: No results for input(s): VITAMINB12, FOLATE, FERRITIN, TIBC, IRON, RETICCTPCT in the last 72 hours. Sepsis Labs: No results for input(s): PROCALCITON, LATICACIDVEN in the last 168 hours.  Recent Results (from the past 240 hour(s))  Respiratory Panel by RT PCR (Flu A&B, Covid) - Nasopharyngeal Swab     Status: None   Collection Time: 05/06/19  7:16 PM   Specimen: Nasopharyngeal Swab  Result Value Ref Range Status   SARS Coronavirus 2 by RT PCR NEGATIVE NEGATIVE Final    Comment: (NOTE) SARS-CoV-2 target nucleic acids are NOT DETECTED. The SARS-CoV-2 RNA is generally detectable in upper respiratoy specimens during the acute phase of infection. The lowest concentration of SARS-CoV-2 viral copies this assay can detect is 131 copies/mL. A negative result does not preclude SARS-Cov-2 infection and should not be used as  the sole basis for treatment or other patient management decisions. A negative result may occur with  improper specimen collection/handling, submission of specimen other than nasopharyngeal swab, presence of viral mutation(s) within the areas targeted by this assay, and inadequate number of viral copies (<131 copies/mL). A negative result must be combined with clinical observations, patient history, and epidemiological information. The expected result is Negative. Fact Sheet for Patients:  https://www.moore.com/https://www.fda.gov/media/142436/download Fact Sheet for Healthcare Providers:  https://www.young.biz/https://www.fda.gov/media/142435/download This test is not yet ap proved or cleared by the [place] FDA and  has been authorized for detection  and/or diagnosis of SARS-CoV-2 by FDA under an Emergency Use Authorization (EUA). This EUA will remain  in effect (meaning this test can be used) for the duration of the COVID-19 declaration under Section 564(b)(1) of the Act, 21 U.S.C. section 360bbb-3(b)(1), unless the authorization is terminated or revoked sooner.    Influenza A by PCR NEGATIVE NEGATIVE Final   Influenza B by PCR NEGATIVE NEGATIVE Final    Comment: (NOTE) The Xpert Xpress SARS-CoV-2/FLU/RSV assay is intended as an aid in  the diagnosis of influenza from Nasopharyngeal swab specimens and  should not be used as a sole basis for treatment. Nasal washings and  aspirates are unacceptable for Xpert Xpress SARS-CoV-2/FLU/RSV  testing. Fact Sheet for Patients: https://www.moore.com/https://www.fda.gov/media/142436/download Fact Sheet for Healthcare Providers: https://www.young.biz/https://www.fda.gov/media/142435/download This test is not yet approved or cleared by the [place] FDA and  has been authorized for detection and/or diagnosis of SARS-CoV-2 by  FDA under an Emergency Use Authorization (EUA). This EUA will remain  in effect (meaning this test can be used) for the duration of the  Covid-19 declaration under Section 564(b)(1) of the Act, 21  U.S.C. section 360bbb-3(b)(1), unless the authorization is  terminated or revoked. Performed at [facility], [address] [place], [place] [address]   MRSA PCR Screening     Status: None   Collection Time: 05/07/19  4:23 AM   Specimen: Nasal Mucosa; Nasopharyngeal  Result Value Ref Range Status   MRSA by PCR NEGATIVE NEGATIVE Final    Comment:        The GeneXpert MRSA Assay (FDA approved for NASAL specimens only), is one component of a comprehensive MRSA colonization surveillance program. It is not intended to diagnose MRSA infection nor to guide or monitor treatment for MRSA infections. Performed at [facility], [address] [place], [place] [address]      Radiology  Studies: ECHOCARDIOGRAM COMPLETE  Result Date: 05/08/2019    ECHOCARDIOGRAM REPORT   Patient Name:   Holly Shelton Date of Exam: 05/08/2019 Medical Rec #:  [id number]      Height:       60.0 in Accession #:    [id number]     Weight:       156.5 lb Date of Birth:  03/24/1940      BSA:          1.682 m Patient Age:    78 years       BP:           87/57 mmHg Patient Gender: F              HR:           74 bpm. Exam Location:  [facility] Procedure: 2D Echo, Color Doppler and Cardiac Doppler Indications:     R07.9 Chest Pain  History:         Patient has no prior history of Echocardiogram examinations.  Risk Factors:Hypertension.  Sonographer:     Humphrey Rolls RDCS (AE) Referring Phys:  [id number] [facility] Diagnosing Phys: Lorine Bears MD  Sonographer Comments: Technically difficult study due to poor echo windows. TDS due to inability to position pt due to broken hip. IMPRESSIONS  1. Left ventricular ejection fraction, by estimation, is 55 to 60%. The left ventricle has normal function. Left ventricular endocardial border not optimally defined to evaluate regional wall motion. There is mild left ventricular hypertrophy. Left ventricular diastolic parameters are consistent with Grade I diastolic dysfunction (impaired relaxation).  2. Right ventricular systolic function is normal. The right ventricular size is normal. Tricuspid regurgitation signal is inadequate for assessing PA pressure.  3. The mitral valve is normal in structure. No evidence of mitral valve regurgitation. No evidence of mitral stenosis.  4. The aortic valve is normal in structure. Aortic valve regurgitation is not visualized. No aortic stenosis is present.  5. The inferior vena cava is normal in size with greater than 50% respiratory variability, suggesting right atrial pressure of 3 mmHg. FINDINGS  Left Ventricle: Left ventricular ejection fraction, by estimation, is 55 to 60%. The left ventricle has normal function. Left ventricular  endocardial border not optimally defined to evaluate regional wall motion. The left ventricular internal cavity size was normal in size. There is mild left ventricular hypertrophy. Left ventricular diastolic parameters are consistent with Grade I diastolic dysfunction (impaired relaxation). Right Ventricle: The right ventricular size is normal. No increase in right ventricular wall thickness. Right ventricular systolic function is normal. Tricuspid regurgitation signal is inadequate for assessing PA pressure. Left Atrium: Left atrial size was normal in size. Right Atrium: Right atrial size was normal in size. Pericardium: There is no evidence of pericardial effusion. Mitral Valve: The mitral valve is normal in structure. Normal mobility of the mitral valve leaflets. No evidence of mitral valve regurgitation. No evidence of mitral valve stenosis. MV peak gradient, 3.2 mmHg. The mean mitral valve gradient is 1.0 mmHg. Tricuspid Valve: The tricuspid valve is normal in structure. Tricuspid valve regurgitation is trivial. No evidence of tricuspid stenosis. Aortic Valve: The aortic valve is normal in structure. Aortic valve regurgitation is not visualized. No aortic stenosis is present. Aortic valve mean gradient measures 4.0 mmHg. Aortic valve peak gradient measures 9.5 mmHg. Aortic valve area, by VTI measures 3.58 cm. Pulmonic Valve: The pulmonic valve was normal in structure. Pulmonic valve regurgitation is not visualized. No evidence of pulmonic stenosis. Aorta: The aortic root is normal in size and structure. Venous: The inferior vena cava is normal in size with greater than 50% respiratory variability, suggesting right atrial pressure of 3 mmHg. IAS/Shunts: No atrial level shunt detected by color flow Doppler.  LEFT VENTRICLE PLAX 2D LVIDd:         3.35 cm  Diastology LVIDs:         2.08 cm  LV e' lateral:   6.09 cm/s LV PW:         0.83 cm  LV E/e' lateral: 8.9 LV IVS:        0.67 cm  LV e' medial:    6.42 cm/s  LVOT diam:     2.40 cm  LV E/e' medial:  8.5 LV SV:         77 LV SV Index:   46 LVOT Area:     4.52 cm  LEFT ATRIUM           Index LA diam:      4.10 cm  2.44 cm/m LA Vol (A4C): 49.7 ml 29.55 ml/m  AORTIC VALVE                   PULMONIC VALVE AV Area (Vmax):    2.80 cm    PV Vmax:       1.05 m/s AV Area (Vmean):   3.42 cm    PV Vmean:      65.200 cm/s AV Area (VTI):     3.58 cm    PV VTI:        0.176 m AV Vmax:           154.00 cm/s PV Peak grad:  4.4 mmHg AV Vmean:          92.100 cm/s PV Mean grad:  2.0 mmHg AV VTI:            0.216 m AV Peak Grad:      9.5 mmHg AV Mean Grad:      4.0 mmHg LVOT Vmax:         95.30 cm/s LVOT Vmean:        69.600 cm/s LVOT VTI:          0.171 m LVOT/AV VTI ratio: 0.79  AORTA Ao Root diam: 3.40 cm MITRAL VALVE MV Area (PHT): 4.68 cm    SHUNTS MV Peak grad:  3.2 mmHg    Systemic VTI:  0.17 m MV Mean grad:  1.0 mmHg    Systemic Diam: 2.40 cm MV Vmax:       0.89 m/s MV Vmean:      41.3 cm/s MV Decel Time: 162 msec MV E velocity: 54.30 cm/s MV A velocity: 79.50 cm/s MV E/A ratio:  0.68 Kathlyn Sacramento MD Electronically signed by Kathlyn Sacramento MD Signature Date/Time: 05/08/2019/4:50:52 PM    Final    DG HIP PORT UNILAT WITH PELVIS 1V RIGHT  Result Date: 05/09/2019 CLINICAL DATA:  RIGHT hip pain, hip surgery on 05/07/2019 EXAM: DG HIP (WITH OR WITHOUT PELVIS) 1V PORT RIGHT COMPARISON:  05/07/2019 FINDINGS: IM nail with compression screw at proximal RIGHT femur post ORIF. Bones demineralized. No additional fracture, dislocation, or bone destruction. Degenerative disc and facet disease changes of visualized lower lumbar spine. IMPRESSION: Osseous demineralization post ORIF of proximal RIGHT femoral fracture. No new abnormalities or significant change since 05/07/2019. Electronically Signed   By: Lavonia Dana M.D.   On: 05/09/2019 09:57    Scheduled Meds: . aspirin EC  81 mg Oral QHS  . B-complex with vitamin C  1 tablet Oral Daily  . calcium acetate (Phos Binder)  667 mg Oral  BID  . calcium-vitamin D  1 tablet Oral Daily  . Chlorhexidine Gluconate Cloth  6 each Topical Daily  . cholecalciferol  1,000 Units Oral Daily  . docusate sodium  100 mg Oral BID  . enoxaparin (LOVENOX) injection  30 mg Subcutaneous Q24H  . feeding supplement (ENSURE ENLIVE)  237 mL Oral BID BM  . ferrous [id number] C-folic acid  1 capsule Oral BID  . mometasone-formoterol  2 puff Inhalation BID  . nicotine  14 mg Transdermal Daily  . pantoprazole  40 mg Oral Daily  . rosuvastatin  10 mg Oral QHS  . tiotropium  1 capsule Inhalation Daily   Continuous Infusions: . sodium chloride 75 mL/hr at 05/09/19 1101     LOS: 3 days   Time spent: 40 minutes.  Lorella Nimrod, MD [facility]  If 7PM-7AM, please contact night-coverage [email or web address]  05/09/2019, 3:36 PM  This record has been created using Systems analyst. Errors have been sought and corrected,but may not always be located. Such creation errors do not reflect on the standard of care.

## 2019-05-09 NOTE — TOC Progression Note (Signed)
Transition of Care [facility]) - Progression Note    Patient Details  Name: Holly Shelton MRN: [id number] Date of Birth: [birth year]  Transition of Care [facility]) CM/SW [place], RN Phone Number: 05/09/2019, 1:29 PM  Clinical Narrative:    Met with the patient and her husband at the bedside, We reviewed the bed choices and they inquired about the visitation if they were vaccinated, both [facility] and [facility] offer visitation that is scheduled with the patient and visitor being vaccinated. With this information they chose to go to [facility]. I notified Magda Paganini at [facility]. Magda Paganini is starting [employer] authorization as it is regular Building services engineer and Services                                                 Social Determinants of Health (SDOH) Interventions    Readmission Risk Interventions No flowsheet data found.

## 2019-05-09 NOTE — Progress Notes (Signed)
Physical Therapy Treatment Patient Details Name: Holly Shelton MRN: [id number] DOB: 1940/10/26 Today's Date: 05/09/2019    History of Present Illness Per chart review: Pt is a 79 y/o F with a R subtrochanteric hip fx s/p IM nail. PMH includes: HTN, COPD, GERD, and AAA.    PT Comments    Pt required min encouragement to participate fully with PT services this session secondary to R hip pain, nursing notified.  Pt participated with below therex with frequent short therapeutic rest breaks and multi-modal cuing for technique and increased effort.  Pt required significant physical assistance with bed mobility and transfers and was unable to come to full upright standing position instead standing with trunk flexed while leaing heavily to the left.  Pt required physical assist to remain in standing and was unable to advance either LE. Pt will benefit from PT services in a SNF setting upon discharge to safely address deficits listed in patient problem list for decreased caregiver assistance and eventual return to PLOF.    Follow Up Recommendations  SNF     Equipment Recommendations  Rolling walker with 5" wheels;3in1 (PT)    Recommendations for Other Services       Precautions / Restrictions Precautions Precautions: Fall Restrictions Weight Bearing Restrictions: Yes RLE Weight Bearing: Weight bearing as tolerated    Mobility  Bed Mobility Overal bed mobility: Needs Assistance Bed Mobility: Supine to Sit;Sit to Supine;Rolling Rolling: Max assist   Supine to sit: +2 for physical assistance;Mod assist Sit to supine: Max assist;+2 for physical assistance   General bed mobility comments: Max assist for BLE and mod assist for trunk control  Transfers Overall transfer level: Needs assistance Equipment used: Rolling walker (2 wheeled) Transfers: Sit to/from Stand Sit to Stand: Mod assist;+2 physical assistance         General transfer comment: Pt was able to stand upright with mod A  +2 and required min-mod A +1 to maintain position, unable to come to full upright standing  Ambulation/Gait             General Gait Details: Unable to advance either LE while in standing   Stairs             Wheelchair Mobility    Modified Rankin (Stroke Patients Only)       Balance Overall balance assessment: Needs assistance Sitting-balance support: Bilateral upper extremity supported;Feet unsupported Sitting balance-Leahy Scale: Fair Sitting balance - Comments: Pt able to maintain static sitting balance without physical assist this session Postural control: Right lateral lean Standing balance support: Bilateral upper extremity supported Standing balance-Leahy Scale: Poor Standing balance comment: Pt required constant physical assistance to maintain standing position                            Cognition Arousal/Alertness: Awake/alert Behavior During Therapy: WFL for tasks assessed/performed Overall Cognitive Status: Within Functional Limits for tasks assessed                                        Exercises Total Joint Exercises Ankle Circles/Pumps: AROM;Strengthening;Both;10 reps;5 reps Quad Sets: Strengthening;Both;10 reps Gluteal Sets: Strengthening;Both;10 reps Hip ABduction/ADduction: Strengthening;Both;10 reps;AAROM Straight Leg Raises: Strengthening;AAROM;Both;10 reps Long Arc Quad: AROM;Strengthening;Both;10 reps;5 reps;AAROM(AAROM on the RLE for full ROM) Knee Flexion: AROM;Strengthening;Both;5 reps;10 reps Other Exercises Other Exercises: Pt education on physiological benefits of activity provided  General Comments        Pertinent Vitals/Pain Pain Assessment: 0-10 Pain Score: 9  Pain Location: R hip Pain Descriptors / Indicators: Aching;Sore Pain Intervention(s): Premedicated before session;Monitored during session;Patient requesting pain meds-RN notified    Home Living                      Prior  Function            PT Goals (current goals can now be found in the care plan section) Progress towards PT goals: Not progressing toward goals - comment(Pt limited by R hip pain and significant functional weakness)    Frequency    BID      PT Plan Current plan remains appropriate    Co-evaluation              AM-PAC PT "6 Clicks" Mobility   Outcome Measure  Help needed turning from your back to your side while in a flat bed without using bedrails?: A Lot Help needed moving from lying on your back to sitting on the side of a flat bed without using bedrails?: A Lot Help needed moving to and from a bed to a chair (including a wheelchair)?: Total Help needed standing up from a chair using your arms (e.g., wheelchair or bedside chair)?: A Lot Help needed to walk in hospital room?: Total Help needed climbing 3-5 steps with a railing? : Total 6 Click Score: 9    End of Session Equipment Utilized During Treatment: Gait belt Activity Tolerance: Patient limited by pain Patient left: in bed;with call bell/phone within reach;with bed alarm set;with family/visitor present;with SCD's reapplied Nurse Communication: Mobility status;Other (comment)(L ankle pain with A/PROM) PT Visit Diagnosis: Unsteadiness on feet (R26.81);Muscle weakness (generalized) (M62.81);Pain;Other abnormalities of gait and mobility (R26.89) Pain - Right/Left: Right Pain - part of body: Hip     Time: 4268-3419 PT Time Calculation (min) (ACUTE ONLY): 31 min  Charges:  $Therapeutic Exercise: 8-22 mins $Therapeutic Activity: 8-22 mins                     D. Scott Tyhesha Dutson PT, DPT 05/09/19, 3:48 PM

## 2019-05-09 NOTE — Care Management Important Message (Signed)
Important Message  Patient Details  Name: Holly Shelton MRN: [id number] Date of Birth: [birth year]   Medicare Important Message Given:  Yes     Olegario Messier A Kaiden Dardis 05/09/2019, 10:41 AM

## 2019-05-09 NOTE — Progress Notes (Signed)
Physical Therapy Treatment Patient Details Name: Holly Shelton MRN: [id number] DOB: [birth year] Today's Date: 05/09/2019    History of Present Illness Per chart review: Pt is a 79 y/o F with a R subtrochanteric hip fx s/p IM nail. PMH includes: HTN, COPD, GERD, and AAA.    PT Comments    Pt was agreeable to work with PT this morning. Pt found on room air and SpO2 was 88% or more t/o the session. Pt required mod-max assist for bed mobility and mod A +2 for transfers. Pt was unable to support herself fully in standing and was unable to take a step with either leg; however, pt was able to stand with min-mod A for one minute before returning to sitting. Pt will benefit from PT services in a SNF setting upon discharge to safely address deficits listed in patient problem list for decreased caregiver assistance and eventual return to PLOF.    Follow Up Recommendations  SNF     Equipment Recommendations  Rolling walker with 5" wheels;3in1 (PT)    Recommendations for Other Services       Precautions / Restrictions Precautions Precautions: Fall Restrictions Weight Bearing Restrictions: Yes RLE Weight Bearing: Weight bearing as tolerated    Mobility  Bed Mobility Overal bed mobility: Needs Assistance Bed Mobility: Supine to Sit;Sit to Supine     Supine to sit: Mod assist(mod assist for RLE and trunk control) Sit to supine: Max assist(max assist for RLE and trunk control)      Transfers Overall transfer level: Needs assistance Equipment used: Rolling walker (2 wheeled) Transfers: Sit to/from Stand Sit to Stand: Mod assist;+2 physical assistance         General transfer comment: Pt was able to stand upright with mod A +2 and required min-mod A +1 to maintain position  Ambulation/Gait             General Gait Details: Unable to take a step with either LE   Stairs             Wheelchair Mobility    Modified Rankin (Stroke Patients Only)       Balance  Overall balance assessment: Needs assistance Sitting-balance support: Bilateral upper extremity supported;Feet unsupported Sitting balance-Leahy Scale: Poor Sitting balance - Comments: Pt relied on UE support to maintain upright sitting posture and had difficulty with brief moments of single UE support. Postural control: Right lateral lean Standing balance support: Bilateral upper extremity supported;During functional activity Standing balance-Leahy Scale: Poor Standing balance comment: Pt required constant physical assistance to maintain standing position                            Cognition Arousal/Alertness: Awake/alert Behavior During Therapy: WFL for tasks assessed/performed Overall Cognitive Status: Within Functional Limits for tasks assessed                                        Exercises Total Joint Exercises Ankle Circles/Pumps: AROM;Strengthening;Both;10 reps Quad Sets: Strengthening;Both;10 reps Gluteal Sets: Strengthening;Both;10 reps Hip ABduction/ADduction: Strengthening;Both;10 reps;AAROM Straight Leg Raises: Strengthening;AAROM;Both;10 reps Other Exercises Other Exercises: sit-to-stand training    General Comments        Pertinent Vitals/Pain Pain Assessment: 0-10 Pain Score: 10-Worst pain ever Pain Location: R hip and down the leg Pain Descriptors / Indicators: Aching;Sore Pain Intervention(s): Monitored during session;Premedicated before session    Home  Living                      Prior Function            PT Goals (current goals can now be found in the care plan section) Progress towards PT goals: Progressing toward goals    Frequency    BID      PT Plan Current plan remains appropriate    Co-evaluation              AM-PAC PT "6 Clicks" Mobility   Outcome Measure  Help needed turning from your back to your side while in a flat bed without using bedrails?: A Lot Help needed moving from lying on  your back to sitting on the side of a flat bed without using bedrails?: A Lot Help needed moving to and from a bed to a chair (including a wheelchair)?: Total Help needed standing up from a chair using your arms (e.g., wheelchair or bedside chair)?: A Lot Help needed to walk in hospital room?: Total Help needed climbing 3-5 steps with a railing? : Total 6 Click Score: 9    End of Session Equipment Utilized During Treatment: Gait belt Activity Tolerance: Patient tolerated treatment well Patient left: in bed;with call bell/phone within reach;with bed alarm set;with family/visitor present;with SCD's reapplied Nurse Communication: Mobility status;Weight bearing status PT Visit Diagnosis: Unsteadiness on feet (R26.81);Muscle weakness (generalized) (M62.81);Pain;Other abnormalities of gait and mobility (R26.89) Pain - Right/Left: Right Pain - part of body: Hip     Time: 9767-3419 PT Time Calculation (min) (ACUTE ONLY): 42 min  Charges:                        Veleta Miners, SPT 05/09/19 12:53 PM

## 2019-05-09 NOTE — Progress Notes (Signed)
   Subjective: 2 Days Post-Op Procedure(s) (LRB): INTRAMEDULLARY (IM) NAIL INTERTROCHANTRIC (Right) Patient reports pain as 10 on 0-10 scale.  Limited in what she can take due to elevated creatinine and hypotension. Patient is well, and has had no acute complaints or problems Denies any CP, SOB, ABD pain. We will continue therapy today.  Plan is to go Skilled nursing facility after hospital stay.  Objective: Vital signs in last 24 hours: Temp:  [97.5 F (36.4 C)-99.1 F (37.3 C)] 98.4 F (36.9 C) (04/06 0753) Pulse Rate:  [71-93] 73 (04/06 0753) Resp:  [16-20] 17 (04/06 0405) BP: (83-113)/(47-63) 83/53 (04/06 0753) SpO2:  [88 %-96 %] 89 % (04/06 0753)  Intake/Output from previous day: 04/05 0701 - 04/06 0700 In: 1065 [P.O.:240; I.V.:825] Out: -  Intake/Output this shift: No intake/output data recorded.  Recent Labs    05/06/19 1808 05/07/19 0507 05/08/19 0419 05/09/19 0511  HGB 12.3 12.2 9.6* 8.8*   Recent Labs    05/08/19 0419 05/09/19 0511  WBC 13.9* 12.6*  RBC 3.21* 2.87*  HCT 29.6* 26.1*  PLT 315 302   Recent Labs    05/08/19 0419 05/09/19 0511  NA 134* 135  K 4.0 4.0  CL 97* 102  CO2 27 24  BUN 24* 26*  CREATININE 1.79* 1.41*  GLUCOSE 96 92  CALCIUM 8.5* 9.1   No results for input(s): LABPT, INR in the last 72 hours.  EXAM General - Patient is Alert, Appropriate and Oriented Extremity - Neurovascular intact Sensation intact distally Intact pulses distally Dorsiflexion/Plantar flexion intact No cellulitis present Compartment soft Dressing - moderate drainage Motor Function - intact, moving foot and toes well on exam.   Past Medical History:  Diagnosis Date  . Hypertension     Assessment/Plan:   2 Days Post-Op Procedure(s) (LRB): INTRAMEDULLARY (IM) NAIL INTERTROCHANTRIC (Right) Active Problems:   Closed displaced subtrochanteric fracture of right femur (HCC)   COPD (chronic obstructive pulmonary disease) (HCC)   Essential  hypertension   Tobacco use   History of recurrent UTI (urinary tract infection)   GERD (gastroesophageal reflux disease)   Aneurysm of descending thoracic aorta (HCC)   Closed fracture of right hip (HCC)   Fall   Acute post op blood loss anemia   Estimated body mass index is 30.57 kg/m as calculated from the following:   Height as of this encounter: 5' (1.524 m).   Weight as of this encounter: 71 kg. Advance diet Up with therapy  Work on bowel movement. Acute post op blood loss anemia - Hgb 8.8. Recheck Hgb in the am.  Start iron supplement Pain severe.  Pain medications have been limited due to hypotension. Care management to assist with discharge.  DVT Prophylaxis - Lovenox, TED hose and SCDs Weight-Bearing as tolerated to right leg   T. Cranston Neighbor, PA-C [facility] 05/09/2019, 8:30 AM

## 2019-05-09 NOTE — Progress Notes (Signed)
At 2010 sent Secure chat to NP Webb Silversmith about patient pain and low blood pressure. At 2105 2nd message sent to NP in regards to the patient's pain. Via the unit at 2200 spoke with NP Ouma about patient's pain level and concern with given her pain medications due to her low blood pressure issue. Per NP Ouma "okay to give current pain medications with blood pressure".

## 2019-05-09 NOTE — Plan of Care (Signed)
  Problem: Activity: Goal: Ability to ambulate and perform ADLs will improve Outcome: Progressing   Problem: Clinical Measurements: Goal: Postoperative complications will be avoided or minimized Outcome: Progressing   Problem: Pain Management: Goal: Pain level will decrease Outcome: Progressing   

## 2019-05-10 ENCOUNTER — Inpatient Hospital Stay: Payer: Medicare PPO

## 2019-05-10 LAB — CBC
HCT: 27.7 % — ABNORMAL LOW (ref 36.0–46.0)
Hemoglobin: 8.8 g/dL — ABNORMAL LOW (ref 12.0–15.0)
MCH: 29.5 pg (ref 26.0–34.0)
MCHC: 31.8 g/dL (ref 30.0–36.0)
MCV: 93 fL (ref 80.0–100.0)
Platelets: 382 10*3/uL (ref 150–400)
RBC: 2.98 MIL/uL — ABNORMAL LOW (ref 3.87–5.11)
RDW: 14.1 % (ref 11.5–15.5)
WBC: 13.1 10*3/uL — ABNORMAL HIGH (ref 4.0–10.5)
nRBC: 0 % (ref 0.0–0.2)

## 2019-05-10 LAB — BASIC METABOLIC PANEL
Anion gap: 11 (ref 5–15)
BUN: 23 mg/dL (ref 8–23)
CO2: 22 mmol/L (ref 22–32)
Calcium: 9.3 mg/dL (ref 8.9–10.3)
Chloride: 105 mmol/L (ref 98–111)
Creatinine, Ser: 1.04 mg/dL — ABNORMAL HIGH (ref 0.44–1.00)
GFR calc Af Amer: >60 mL/min (ref >60)
GFR calc non Af Amer: 52 mL/min — ABNORMAL LOW (ref >60)
Glucose, Bld: 85 mg/dL (ref 70–99)
Potassium: 3.8 mmol/L (ref 3.5–5.1)
Sodium: 138 mmol/L (ref 135–145)

## 2019-05-10 LAB — RESPIRATORY PANEL BY RT PCR (FLU A&B, COVID)
Influenza A by PCR: NEGATIVE
Influenza B by PCR: NEGATIVE
SARS Coronavirus 2 by RT PCR: NEGATIVE

## 2019-05-10 MED ORDER — ENOXAPARIN SODIUM 40 MG/0.4ML ~~LOC~~ SOLN
40.0000 mg | SUBCUTANEOUS | Status: DC
  Administered 2019-05-11: 09:00:00 40 mg via SUBCUTANEOUS
  Filled 2019-05-10: qty 0.4

## 2019-05-10 NOTE — Progress Notes (Signed)
PROGRESS NOTE    Francely Craw  JTT:[id number] DOB: 1940-02-10 DOA: 05/06/2019 PCP: Patient, No Pcp Per   Brief Narrative:  79 year old female with hypertension, COPD with active tobacco use, GERD, AAA of descending thoracic aorta and recurrent UTIs on nitrofurantoin presented to the ED with mechanical fall and sustained right hip pain. In the ED x-ray of the hip showed transverse displaced and impacted right subtrochanteric fracture. Blood work showed mild AKI with creatinine of 1.18. Admitted for further management and taken to the OR yesterday.  Patient tolerated the procedure well and will go to SNF as soon as bed is available.  Subjective: Patient was complaining of right-sided chest pain earlier this morning which resolved spontaneously.  She was also concerned about some coughing with food and difficulty swallowing.  She was accompanied by her husband in the room.  Assessment & Plan:   Active Problems:   Closed displaced subtrochanteric fracture of right femur (HCC)   COPD (chronic obstructive pulmonary disease) (HCC)   Essential hypertension   Tobacco use   History of recurrent UTI (urinary tract infection)   GERD (gastroesophageal reflux disease)   Aneurysm of descending thoracic aorta (HCC)   Closed fracture of right hip (HCC)   Fall  Closed displaced subtrochanteric right femur fracture (HCC) Secondary to mechanical fall.  Taken for ORIF yesterday. -Problem controlling pain with narcotic due to hypotension. -Add Tylenol and see if that will help with her pain. -Continue with as needed Vicodin if blood pressure permits. -PT/OT evaluation -pending SNF placement.  Social worker have started the search for bed and insurance authorization. -Covid testing ordered and most likely should be able to go tomorrow.  Difficulty swallowing/coughing with food.  Swallow evaluation was obtained which shows normal pharyngeal phase but there was some concern of esophageal motility as  patient is having difficulty swallowing both solids and liquids.  Risk for aspiration. -DG esophagus with barium swallow to see any esophageal dysmotility.  Essential hypertension.  Blood pressure in goal today. patient was on amlodipine and propranolol at home which were discontinued.  Might not need them on discharge. -Continue holding home antihypertensives-restarted if needed.  Anemia secondary to blood loss.  Hemoglobin dropped to 9.6 postoperatively, further decreased to 8.8 today, stable.  Got hip x-ray to rule out any hematoma which was negative for any blood collection.  -Monitor CBC. -Start her on iron supplement. -Transfuse if below 7.  Chest pain.  Patient experienced some left-sided lower chest pain 2 days ago which was thought to be due to musculoskeletal.  EKG without any acute change and troponin remain negative. She had another episode of right-sided chest pain which resolved spontaneously, most likely musculoskeletal.  No red flag symptoms. -Echo was ordered-EF of 55 to 60% with grade 1 diastolic dysfunction. -Continue to monitor.  AKI.  Creatinine improving, 1.04 today. -Continue to monitor with gentle IV fluid. -Avoid nephrotoxins.  Descending thoracic aortic aneurysm Last CT of the chest from 9///2019 at [facility] showing aneurysmal dilatation measuring 5.2 x 5.8 cm.  Reportedly being followed by cardiothoracic surgery at [facility].  COPD Currently stable.  Active tobacco use and counseled on cessation.  -Continue home bronchodilators.  - Nicotine patch.  GERD Continue PPI   History of recurrent UTI. On chronic nitrofurantoin, held due to AKI.  Objective: Vitals:   05/09/19 1631 05/09/19 2321 05/10/19 0023 05/10/19 0739  BP: (!) 102/59 (!) 118/59 (!) 116/52 114/64  Pulse: 81 81 82 97  Resp:  17 14 20   Temp: )  97.4 F (36.3 C) 98.3 F (36.8 C) 98 F (36.7 C) 98.2 F (36.8 C)  TempSrc: Oral  Oral Oral  SpO2: 90% 95% 96% 97%  Weight:      Height:         Intake/Output Summary (Last 24 hours) at 05/10/2019 1602 Last data filed at 05/10/2019 0629 Gross per 24 hour  Intake 915 ml  Output 600 ml  Net 315 ml   Filed Weights   05/06/19 1806 05/07/19 0040  Weight: 66.8 kg 71 kg    Examination:  General exam: Appears calm and comfortable  Respiratory system: Clear to auscultation. Respiratory effort normal. Cardiovascular system: S1 & S2 heard, RRR. No JVD, murmurs, rubs, gallops or clicks. Gastrointestinal system: Soft, nontender, nondistended, bowel sounds positive. Central nervous system: Alert and oriented. No focal neurological deficits.Symmetric 5 x 5 power. Extremities: No edema, no cyanosis, pulses intact and symmetrical. Psychiatry: Judgement and insight appear normal.    DVT prophylaxis: Lovenox Code Status: Full Family Communication: Husband was updated at bedside. Disposition Plan: Pending improvement.  Patient with persistent hypotension which interfered with proper pain management.  PT is recommending SNF placement.  Covid test ordered today. Patient will be discharged to [facility].  Consultants:   Orthopedic  Procedures:  Antimicrobials:   Data Reviewed: I have personally reviewed following labs and imaging studies  CBC: Recent Labs  Lab 05/06/19 1808 05/07/19 0507 05/08/19 0419 05/09/19 0511 05/10/19 0448  WBC 13.0* 17.5* 13.9* 12.6* 13.1*  NEUTROABS 10.3*  --   --   --   --   HGB 12.3 12.2 9.6* 8.8* 8.8*  HCT 36.5 37.0 29.6* 26.1* 27.7*  MCV 89.2 91.1 92.2 90.9 93.0  PLT 452* 409* 315 302 093   Basic Metabolic Panel: Recent Labs  Lab 05/06/19 1808 05/07/19 0507 05/08/19 0419 05/09/19 0511 05/10/19 0448  NA 138 139 134* 135 138  K 3.3* 3.5 4.0 4.0 3.8  CL 99 98 97* 102 105  CO2 24 26 27 24 22   GLUCOSE 136* 145* 96 92 85  BUN 15 19 24* 26* 23  CREATININE 1.18* 1.44* 1.79* 1.41* 1.04*  CALCIUM 9.8 9.3 8.5* 9.1 9.3   GFR: Estimated Creatinine Clearance: 40.5 mL/min (A) (by  C-G formula based on SCr of 1.04 mg/dL (H)). Liver Function Tests: No results for input(s): AST, ALT, ALKPHOS, BILITOT, PROT, ALBUMIN in the last 168 hours. No results for input(s): LIPASE, AMYLASE in the last 168 hours. No results for input(s): AMMONIA in the last 168 hours. Coagulation Profile: No results for input(s): INR, PROTIME in the last 168 hours. Cardiac Enzymes: No results for input(s): CKTOTAL, CKMB, CKMBINDEX, TROPONINI in the last 168 hours. BNP (last 3 results) No results for input(s): PROBNP in the last 8760 hours. HbA1C: No results for input(s): HGBA1C in the last 72 hours. CBG: No results for input(s): GLUCAP in the last 168 hours. Lipid Profile: No results for input(s): CHOL, HDL, LDLCALC, TRIG, CHOLHDL, LDLDIRECT in the last 72 hours. Thyroid Function Tests: No results for input(s): TSH, T4TOTAL, FREET4, T3FREE, THYROIDAB in the last 72 hours. Anemia Panel: No results for input(s): VITAMINB12, FOLATE, FERRITIN, TIBC, IRON, RETICCTPCT in the last 72 hours. Sepsis Labs: No results for input(s): PROCALCITON, LATICACIDVEN in the last 168 hours.  Recent Results (from the past 240 hour(s))  Respiratory Panel by RT PCR (Flu A&B, Covid) - Nasopharyngeal Swab     Status: None   Collection Time: 05/06/19  7:16 PM   Specimen: Nasopharyngeal Swab  Result Value Ref Range Status   SARS Coronavirus 2 by RT PCR NEGATIVE NEGATIVE Final    Comment: (NOTE) SARS-CoV-2 target nucleic acids are NOT DETECTED. The SARS-CoV-2 RNA is generally detectable in upper respiratoy specimens during the acute phase of infection. The lowest concentration of SARS-CoV-2 viral copies this assay can detect is 131 copies/mL. A negative result does not preclude SARS-Cov-2 infection and should not be used as the sole basis for treatment or other patient management decisions. A negative result may occur with  improper specimen collection/handling, submission of specimen other than nasopharyngeal swab,  presence of viral mutation(s) within the areas targeted by this assay, and inadequate number of viral copies (<131 copies/mL). A negative result must be combined with clinical observations, patient history, and epidemiological information. The expected result is Negative. Fact Sheet for Patients:  https://www.moore.com/ Fact Sheet for Healthcare Providers:  https://www.young.biz/ This test is not yet ap proved or cleared by the [place] FDA and  has been authorized for detection and/or diagnosis of SARS-CoV-2 by FDA under an Emergency Use Authorization (EUA). This EUA will remain  in effect (meaning this test can be used) for the duration of the COVID-19 declaration under Section 564(b)(1) of the Act, 21 U.S.C. section 360bbb-3(b)(1), unless the authorization is terminated or revoked sooner.    Influenza A by PCR NEGATIVE NEGATIVE Final   Influenza B by PCR NEGATIVE NEGATIVE Final    Comment: (NOTE) The Xpert Xpress SARS-CoV-2/FLU/RSV assay is intended as an aid in  the diagnosis of influenza from Nasopharyngeal swab specimens and  should not be used as a sole basis for treatment. Nasal washings and  aspirates are unacceptable for Xpert Xpress SARS-CoV-2/FLU/RSV  testing. Fact Sheet for Patients: https://www.moore.com/ Fact Sheet for Healthcare Providers: https://www.young.biz/ This test is not yet approved or cleared by the [place] FDA and  has been authorized for detection and/or diagnosis of SARS-CoV-2 by  FDA under an Emergency Use Authorization (EUA). This EUA will remain  in effect (meaning this test can be used) for the duration of the  Covid-19 declaration under Section 564(b)(1) of the Act, 21  U.S.C. section 360bbb-3(b)(1), unless the authorization is  terminated or revoked. Performed at [facility], [address] [place], [place] [address]   MRSA PCR Screening      Status: None   Collection Time: 05/07/19  4:23 AM   Specimen: Nasal Mucosa; Nasopharyngeal  Result Value Ref Range Status   MRSA by PCR NEGATIVE NEGATIVE Final    Comment:        The GeneXpert MRSA Assay (FDA approved for NASAL specimens only), is one component of a comprehensive MRSA colonization surveillance program. It is not intended to diagnose MRSA infection nor to guide or monitor treatment for MRSA infections. Performed at [facility], [address] [place], [place] [address]   Respiratory Panel by RT PCR (Flu A&B, Covid) - Nasopharyngeal Swab     Status: None   Collection Time: 05/10/19 12:35 PM   Specimen: Nasopharyngeal Swab  Result Value Ref Range Status   SARS Coronavirus 2 by RT PCR NEGATIVE NEGATIVE Final    Comment: (NOTE) SARS-CoV-2 target nucleic acids are NOT DETECTED. The SARS-CoV-2 RNA is generally detectable in upper respiratoy specimens during the acute phase of infection. The lowest concentration of SARS-CoV-2 viral copies this assay can detect is 131 copies/mL. A negative result does not preclude SARS-Cov-2 infection and should not be used as the sole basis for treatment or other patient management  decisions. A negative result may occur with  improper specimen collection/handling, submission of specimen other than nasopharyngeal swab, presence of viral mutation(s) within the areas targeted by this assay, and inadequate number of viral copies (<131 copies/mL). A negative result must be combined with clinical observations, patient history, and epidemiological information. The expected result is Negative. Fact Sheet for Patients:  https://www.moore.com/ Fact Sheet for Healthcare Providers:  https://www.young.biz/ This test is not yet ap proved or cleared by the [place] FDA and  has been authorized for detection and/or diagnosis of SARS-CoV-2 by FDA under an Emergency Use Authorization (EUA). This  EUA will remain  in effect (meaning this test can be used) for the duration of the COVID-19 declaration under Section 564(b)(1) of the Act, 21 U.S.C. section 360bbb-3(b)(1), unless the authorization is terminated or revoked sooner.    Influenza A by PCR NEGATIVE NEGATIVE Final   Influenza B by PCR NEGATIVE NEGATIVE Final    Comment: (NOTE) The Xpert Xpress SARS-CoV-2/FLU/RSV assay is intended as an aid in  the diagnosis of influenza from Nasopharyngeal swab specimens and  should not be used as a sole basis for treatment. Nasal washings and  aspirates are unacceptable for Xpert Xpress SARS-CoV-2/FLU/RSV  testing. Fact Sheet for Patients: https://www.moore.com/ Fact Sheet for Healthcare Providers: https://www.young.biz/ This test is not yet approved or cleared by the [place] FDA and  has been authorized for detection and/or diagnosis of SARS-CoV-2 by  FDA under an Emergency Use Authorization (EUA). This EUA will remain  in effect (meaning this test can be used) for the duration of the  Covid-19 declaration under Section 564(b)(1) of the Act, 21  U.S.C. section 360bbb-3(b)(1), unless the authorization is  terminated or revoked. Performed at [facility], [address] [place], [place] [address]      Radiology Studies: DG HIP PORT UNILAT WITH PELVIS 1V RIGHT  Result Date: 05/09/2019 CLINICAL DATA:  RIGHT hip pain, hip surgery on 05/07/2019 EXAM: DG HIP (WITH OR WITHOUT PELVIS) 1V PORT RIGHT COMPARISON:  05/07/2019 FINDINGS: IM nail with compression screw at proximal RIGHT femur post ORIF. Bones demineralized. No additional fracture, dislocation, or bone destruction. Degenerative disc and facet disease changes of visualized lower lumbar spine. IMPRESSION: Osseous demineralization post ORIF of proximal RIGHT femoral fracture. No new abnormalities or significant change since 05/07/2019. Electronically Signed   By: Ulyses Southward M.D.   On:  05/09/2019 09:57    Scheduled Meds: . aspirin EC  81 mg Oral QHS  . B-complex with vitamin C  1 tablet Oral Daily  . calcium acetate (Phos Binder)  667 mg Oral BID  . calcium-vitamin D  1 tablet Oral Daily  . Chlorhexidine Gluconate Cloth  6 each Topical Daily  . cholecalciferol  1,000 Units Oral Daily  . docusate sodium  100 mg Oral BID  . [START ON 05/11/2019] enoxaparin (LOVENOX) injection  40 mg Subcutaneous Q24H  . feeding supplement (ENSURE ENLIVE)  237 mL Oral BID BM  . ferrous fumarate-b12-vitamic C-folic acid  1 capsule Oral BID  . mometasone-formoterol  2 puff Inhalation BID  . nicotine  14 mg Transdermal Daily  . pantoprazole  40 mg Oral Daily  . rosuvastatin  10 mg Oral QHS  . tiotropium  1 capsule Inhalation Daily   Continuous Infusions: . sodium chloride 75 mL/hr at 05/09/19 2226     LOS: 4 days   Time spent: 40 minutes.  Arnetha Courser, MD [facility]  If 7PM-7AM, please contact night-coverage [email or web address]  05/10/2019,  4:02 PM   This record has been created using Conservation officer, historic buildingsDragon voice recognition software. Errors have been sought and corrected,but may not always be located. Such creation errors do not reflect on the standard of care.

## 2019-05-10 NOTE — TOC Progression Note (Signed)
Transition of Care [facility]) - Progression Note    Patient Details  Name: Holly Shelton MRN: [id number] Date of Birth: Jun 25, 1940  [facility]) CM/SW Contact  Barrie Dunker, RN Phone Number: 05/10/2019, 9:24 AM  Clinical Narrative:     [name] out to [name] at [employer] to inquire if they have gotten auth from [place], Still pending but do anticipate getting today       Expected Discharge Plan and Services                                                 Social Determinants of Health (SDOH) Interventions    Readmission Risk Interventions No flowsheet data found.

## 2019-05-10 NOTE — Progress Notes (Signed)
Physical Therapy Treatment Patient Details Name: Holly Shelton MRN: [id number] DOB: 1940/12/01 Today's Date: 05/10/2019    History of Present Illness Per chart review: Pt is a 79 y/o F with a R subtrochanteric hip fx s/p IM nail. PMH includes: HTN, COPD, GERD, and AAA.    PT Comments    Pt presented with no pain at rest this session but continued to be limited by R hip pain with functional tasks.  Pt put forth good effort throughout the session despite pain, however, and demonstrated grossly improved strength to BLEs during therex.  Pt was able to stand for 30-45 sec but remained unable to advance either LE despite multiple attempts.  Pt will benefit from PT services in a SNF setting upon discharge to safely address deficits listed in patient problem list for decreased caregiver assistance and eventual return to PLOF.     Follow Up Recommendations  SNF     Equipment Recommendations  Rolling walker with 5" wheels;3in1 (PT)    Recommendations for Other Services       Precautions / Restrictions Precautions Precautions: Fall Restrictions Weight Bearing Restrictions: Yes RLE Weight Bearing: Weight bearing as tolerated    Mobility  Bed Mobility Overal bed mobility: Needs Assistance Bed Mobility: Supine to Sit;Sit to Supine;Rolling Rolling: Mod assist   Supine to sit: +2 for physical assistance;Mod assist Sit to supine: Max assist;+2 for physical assistance   General bed mobility comments: Significant assist required for both BLE and trunk control  Transfers Overall transfer level: Needs assistance Equipment used: Rolling walker (2 wheeled) Transfers: Sit to/from Stand Sit to Stand: Mod assist;+2 physical assistance         General transfer comment: Verbal cues for increased trunk flexion during sit to stand with mod A to both stand and to maintain standing  Ambulation/Gait             General Gait Details: Unable to advance either LE while in  standing   Stairs             Wheelchair Mobility    Modified Rankin (Stroke Patients Only)       Balance Overall balance assessment: Needs assistance Sitting-balance support: Bilateral upper extremity supported;Feet unsupported Sitting balance-Leahy Scale: Fair Sitting balance - Comments: Pt able to maintain static sitting balance without physical assist this session   Standing balance support: Bilateral upper extremity supported Standing balance-Leahy Scale: Poor Standing balance comment: Pt required constant physical assistance to maintain standing position                            Cognition Arousal/Alertness: Awake/alert Behavior During Therapy: WFL for tasks assessed/performed Overall Cognitive Status: Within Functional Limits for tasks assessed                                        Exercises Total Joint Exercises Ankle Circles/Pumps: AROM;Strengthening;Both;10 reps Quad Sets: Strengthening;Both;10 reps Gluteal Sets: Strengthening;Both;10 reps Towel Squeeze: Strengthening;Both;5 reps Hip ABduction/ADduction: Strengthening;Both;10 reps;AAROM Straight Leg Raises: Strengthening;AAROM;Both;10 reps Long Arc Quad: AROM;Strengthening;Both;10 reps;15 reps Knee Flexion: AROM;Strengthening;Both;10 reps;15 reps    General Comments        Pertinent Vitals/Pain Pain Assessment: No/denies pain    Home Living                      Prior Function  PT Goals (current goals can now be found in the care plan section) Progress towards PT goals: Progressing toward goals    Frequency    BID      PT Plan Current plan remains appropriate    Co-evaluation              AM-PAC PT "6 Clicks" Mobility   Outcome Measure  Help needed turning from your back to your side while in a flat bed without using bedrails?: A Lot Help needed moving from lying on your back to sitting on the side of a flat bed without using  bedrails?: A Lot Help needed moving to and from a bed to a chair (including a wheelchair)?: Total Help needed standing up from a chair using your arms (e.g., wheelchair or bedside chair)?: A Lot Help needed to walk in hospital room?: Total Help needed climbing 3-5 steps with a railing? : Total 6 Click Score: 9    End of Session Equipment Utilized During Treatment: Gait belt Activity Tolerance: Patient limited by pain Patient left: in bed;with call bell/phone within reach;with bed alarm set;with family/visitor present;with SCD's reapplied Nurse Communication: Mobility status PT Visit Diagnosis: Unsteadiness on feet (R26.81);Muscle weakness (generalized) (M62.81);Pain;Other abnormalities of gait and mobility (R26.89) Pain - Right/Left: Right Pain - part of body: Hip     Time: 6144-3154 PT Time Calculation (min) (ACUTE ONLY): 31 min  Charges:  $Therapeutic Exercise: 8-22 mins $Therapeutic Activity: 8-22 mins                     D. Scott Natoria Archibald PT, DPT 05/10/19, 11:40 AM

## 2019-05-10 NOTE — Progress Notes (Signed)
   Subjective: 3 Days Post-Op Procedure(s) (LRB): INTRAMEDULLARY (IM) NAIL INTERTROCHANTRIC (Right) Patient reports pain as moderate.  Improved from yesterday. Patient is well, and has had no acute complaints or problems Denies any CP, SOB, ABD pain. We will continue therapy today.  Plan is to go Skilled nursing facility after hospital stay.  Objective: Vital signs in last 24 hours: Temp:  [97.4 F (36.3 C)-98.3 F (36.8 C)] 98.2 F (36.8 C) (04/07 0739) Pulse Rate:  [80-97] 97 (04/07 0739) Resp:  [14-20] 20 (04/07 0739) BP: (101-118)/(42-64) 114/64 (04/07 0739) SpO2:  [90 %-97 %] 97 % (04/07 0739)  Intake/Output from previous day: 04/06 0701 - 04/07 0700 In: 915 [P.O.:240; I.V.:675] Out: 600 [Urine:600] Intake/Output this shift: No intake/output data recorded.  Recent Labs    05/08/19 0419 05/09/19 0511 05/10/19 0448  HGB 9.6* 8.8* 8.8*   Recent Labs    05/09/19 0511 05/10/19 0448  WBC 12.6* 13.1*  RBC 2.87* 2.98*  HCT 26.1* 27.7*  PLT 302 382   Recent Labs    05/09/19 0511 05/10/19 0448  NA 135 138  K 4.0 3.8  CL 102 105  CO2 24 22  BUN 26* 23  CREATININE 1.41* 1.04*  GLUCOSE 92 85  CALCIUM 9.1 9.3   No results for input(s): LABPT, INR in the last 72 hours.  EXAM General - Patient is Alert, Appropriate and Oriented Extremity - Neurovascular intact Sensation intact distally Intact pulses distally Dorsiflexion/Plantar flexion intact No cellulitis present Compartment soft Dressing - moderate drainage Motor Function - intact, moving foot and toes well on exam.   Past Medical History:  Diagnosis Date  . Hypertension     Assessment/Plan:   3 Days Post-Op Procedure(s) (LRB): INTRAMEDULLARY (IM) NAIL INTERTROCHANTRIC (Right) Active Problems:   Closed displaced subtrochanteric fracture of right femur (HCC)   COPD (chronic obstructive pulmonary disease) (HCC)   Essential hypertension   Tobacco use   History of recurrent UTI (urinary tract  infection)   GERD (gastroesophageal reflux disease)   Aneurysm of descending thoracic aorta (HCC)   Closed fracture of right hip (HCC)   Fall   Acute post op blood loss anemia   Estimated body mass index is 30.57 kg/m as calculated from the following:   Height as of this encounter: 5' (1.524 m).   Weight as of this encounter: 71 kg. Advance diet Up with therapy  Work on bowel movement. Acute post op blood loss anemia - Hgb 8.8.  Stable.  Continue with iron supplement Pain improving Care management to assist with discharge.  Patient will need follow-up with [facility] orthopedics in 2 weeks Lovenox 40 mg subcu daily x14 days at discharge TED hose bilateral lower extremities x6 weeks  DVT Prophylaxis - Lovenox, TED hose and SCDs Weight-Bearing as tolerated to right leg   T. Cranston Neighbor, PA-C [facility] 05/10/2019, 8:06 AM

## 2019-05-10 NOTE — Plan of Care (Signed)
  Problem: Education: Goal: Verbalization of understanding the information provided (i.e., activity precautions, restrictions, etc) will improve Outcome: Progressing   Problem: Activity: Goal: Ability to ambulate and perform ADLs will improve Outcome: Progressing   Problem: Clinical Measurements: Goal: Postoperative complications will be avoided or minimized Outcome: Progressing   Problem: Pain Management: Goal: Pain level will decrease Outcome: Progressing   

## 2019-05-10 NOTE — Progress Notes (Signed)
PHARMACIST - PHYSICIAN COMMUNICATION  CONCERNING:  Enoxaparin (Lovenox) for DVT Prophylaxis    RECOMMENDATION: Patient was on enoxaprin 30mg  q24 hours for VTE prophylaxis.   Filed Weights   05/06/19 1806 05/07/19 0040  Weight: 66.8 kg (147 lb 4.3 oz) 71 kg (156 lb 8.4 oz)    Body mass index is 30.57 kg/m.  Estimated Creatinine Clearance: 40.5 mL/min (A) (by C-G formula based on SCr of 1.04 mg/dL (H)).   Patient is candidate for enoxaparin 40mg  every 24 hours based on CrCl >68ml/min  (improved since last adjustment)  DESCRIPTION: Pharmacy has adjusted enoxaparin dose per [facility] policy.  Patient is now receiving enoxaparin 40mg  every 24 hours.   31m, PharmD, BCPS Clinical Pharmacist 05/10/2019 8:22 AM

## 2019-05-10 NOTE — Evaluation (Signed)
Clinical/Bedside Swallow Evaluation Patient Details  Name: Holly Shelton MRN: [id number] Date of Birth: 11-Oct-1940  Today's Date: 05/10/2019 Time: SLP Start Time (ACUTE ONLY): 1410 SLP Stop Time (ACUTE ONLY): 1455 SLP Time Calculation (min) (ACUTE ONLY): 45 min  Past Medical History:  Past Medical History:  Diagnosis Date  . Hypertension    Past Surgical History:  Past Surgical History:  Procedure Laterality Date  . INTRAMEDULLARY (IM) NAIL INTERTROCHANTERIC Right 05/07/2019   Procedure: INTRAMEDULLARY (IM) NAIL INTERTROCHANTRIC;  Surgeon: Kennedy Bucker, MD;  Location: [facility] ORS;  Service: Orthopedics;  Laterality: Right;   HPI:  Pt is a 79 y.o. female with medical history significant for HTN, COPD, GERD, AAA thoracic descending aorta, recurrent UTIs on nitrofurantoin and tobacco use who presents to the emergency room with right hip pain following a fall.  Fall appears to be mechanical in that she was walking to the bathroom on her right leg gave out, she fell onto the right hip feeling a pop with immediate onset of severe pain.  X-ray of the hip showed transverse displaced and impacted right subtrochanteric fracture.  Chest x-ray with no acute findings.  Pt is s/p INTRAMEDULLARY (IM) NAIL INTERTROCHANTRIC (Right) and is d/t discharge to SNF for Rehab.  CXR clear at admission.  Pt is c/o chest discomfort and radiating pain w/ difficulty swallowing currently; she is not clear when it began - Husband felt is has been worse since her Fall/admit.  Assessment / Plan / Recommendation Clinical Impression  Pt appears to present w/ adequate oropharyngeal phase swallowing function w/ no neuromuscular swallowing deficits or oropharyngeal phase dysphagia. Pt is at reduced risk for aspiration following general aspiration precautions. However, However, pt has a Baseline of GERD and during this exam, exhibited Significant s/s of Esopahgeal phase Dysmotility including the pain/pressure radiating in the chest,  Belching and Hiccups. She also endorsed having barium swallow exams (GI) prior to 5-6 years ago "but I had a couple". She has been on a PPI for "years". She is currently describing increased discomfort in RIght side of her chest but is recently s/p fall affecting hip/body on Right side. With any Reflux behavior, Retrograde and Dysmotility of foods/liquids in the Esophagus can increase risk for aspiration of the Reflux material during backflow thus impact Pulmonary status. Pt wears U/L Dentures w/ good fit. Pt positioned more forward in bed, then assisted w/ trials of thin liquids via cup/straw(including Tap water), purees, and solids w/ no overt clinical s/s of aspiration noted; clear vocal quality b/t trials and no decline in respiratory status during/post trials. Oral phase appeared [facility] for bolus management, mastication, and A-P transfer of all consistencies. Appropriate oral clearing noted w/ all trials. OM exam was [facility]; lingual/labial movements for strength/ROM [facility]. No unilateral weakness. Speech Clear. Recommend continue current diet(w/ mech soft meats - less meats overall, and moistened foods) w/ thin liquids; general aspiration precautions. MUST have correct positioning upright and forward when eating/drinking. Rest Breaks during meals/oral intake to allow for Esophageal clearing and ease of any discomfort. REFLUX precautions strongly recommended to lessen chance for Regurgitation. Recommend pt f/u w/ GI for management of Reflux and tx as indicated. Disucssion w/ MD on eval results. Handouts given on Reflux. NSG to reconsult ST services if any new needs while admitted. Information given to pt and husband present. MD consulted about f/u w/ DG Esophagus for initial Esophageal assessment.  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 General aspiration precautions. STRICT REFLUX PRECAUTIONS. Drink Supplement per Dietician.   Medication Administration: Whole meds with liquid(but Whole in puree if needed for ease of intake)    Other  Recommendations Recommended Consults: (Dietician f/u) Oral Care Recommendations: Oral care BID;Oral care before and after PO;Staff/trained caregiver to provide oral care Other Recommendations: (n/a)   Follow up Recommendations None      Frequency and Duration (n/a)  (n/a)       Prognosis Prognosis for Safe Diet Advancement: Fair(-Good) Barriers to Reach Goals: (Esophageal dysmotility)      Swallow Study   General Date of Onset: 05/06/19 HPI: Pt is a 79 y.o. female with medical history significant for HTN, COPD, GERD, AAA thoracic descending aorta, recurrent UTIs on nitrofurantoin and tobacco use who presents to the emergency room with right hip pain following a fall.  Fall appears to be mechanical in that she was walking to the bathroom on her right leg gave out, she fell onto the right hip feeling a pop with immediate onset of severe pain.  X-ray of the hip showed transverse displaced and impacted right subtrochanteric fracture.  Chest x-ray with no acute findings.  Pt is s/p INTRAMEDULLARY (IM) NAIL INTERTROCHANTRIC (Right) and is d/t discharge to SNF for Rehab.  CXR clear at admission.  Type of Study: Bedside Swallow Evaluation Previous Swallow Assessment: none Diet Prior to this Study: Regular;Thin liquids Temperature Spikes Noted: No(wbc 13.1) Respiratory Status: Room air History of Recent Intubation: No Behavior/Cognition: Alert;Cooperative;Pleasant mood Oral Cavity Assessment: Within Functional Limits Oral Care Completed by SLP: Yes Oral Cavity - Dentition: Dentures, top;Dentures, bottom Vision: Functional for self-feeding Self-Feeding Abilities: Able to feed self;Needs assist;Needs set up;Total  assist(Tremors in bilat. UEs) Patient Positioning: Upright in bed(needed min more support w/ positioning upright in bed ) Baseline Vocal Quality: Normal Volitional Cough: Strong Volitional Swallow: Able to elicit    Oral/Motor/Sensory Function Overall Oral Motor/Sensory Function: Within functional limits   Ice Chips Ice chips: Within functional limits Presentation: Spoon(fed; 2 trials)   Thin Liquid Thin Liquid: Within functional limits Presentation: Cup;Self Fed;Straw(supported fully; 2 trials via cup; 5 trials via straw)    Nectar Thick Nectar Thick Liquid: Not tested   Honey Thick Honey Thick Liquid: Not tested   Puree Puree: Within functional limits Presentation: Spoon(fed; 3 trials)   Solid     Solid: Within functional limits Presentation: Spoon(fed; 4 trials)       Orinda Kenner, MS, CCC-SLP [facility] 05/10/2019,4:43 PM

## 2019-05-10 NOTE — TOC Progression Note (Signed)
Transition of Care [facility]) - Progression Note    Patient Details  Name: Holly Shelton MRN: [id number] Date of Birth: [birth year]  Transition of Care [facility]) CM/SW Contact  Barrie Dunker, RN Phone Number: 05/10/2019, 2:12 PM  Clinical Narrative:    Received call from [facility], she notified me that they received auth for this patient to go to SNF        Expected Discharge Plan and Services                                                 Social Determinants of Health (SDOH) Interventions    Readmission Risk Interventions No flowsheet data found.

## 2019-05-10 NOTE — Progress Notes (Signed)
Physical Therapy Treatment Patient Details Name: Holly Shelton MRN: [id number] DOB: 1940-06-18 Today's Date: 05/10/2019    History of Present Illness Per chart review: Pt is a 79 y/o F with a R subtrochanteric hip fx s/p IM nail. PMH includes: HTN, COPD, GERD, and AAA.    PT Comments    Pt limited by increased R hip pain this session and declined to participate with functional mobility training and agreed to supine therex only.  Although pt was limited by R hip pain she did put forth good effort with below supine therex with several therapeutic rest breaks provided for pain control.  Pt's SpO2 and HR WNL during the session on room air.  Pt will benefit from PT services in a SNF setting upon discharge to safely address deficits listed in patient problem list for decreased caregiver assistance and eventual return to PLOF.    Follow Up Recommendations  SNF     Equipment Recommendations  Rolling walker with 5" wheels;3in1 (PT)    Recommendations for Other Services       Precautions / Restrictions Precautions Precautions: Fall Restrictions Weight Bearing Restrictions: Yes RLE Weight Bearing: Weight bearing as tolerated    Mobility  Bed Mobility               General bed mobility comments: Pt declined all but supine therex this session secondary to increased R hip pain  Transfers                    Ambulation/Gait                 Stairs             Wheelchair Mobility    Modified Rankin (Stroke Patients Only)       Balance                                            Cognition Arousal/Alertness: Awake/alert Behavior During Therapy: WFL for tasks assessed/performed Overall Cognitive Status: Within Functional Limits for tasks assessed                                        Exercises Total Joint Exercises Ankle Circles/Pumps: AROM;Strengthening;Both;10 reps Quad Sets: Strengthening;Both;10 reps Gluteal  Sets: Strengthening;Both;10 reps Towel Squeeze: Strengthening;Both;10 reps Short Arc Quad: AROM;Both;10 reps Hip ABduction/ADduction: Strengthening;Both;10 reps;AAROM Straight Leg Raises: Strengthening;AAROM;Both;10 reps    General Comments        Pertinent Vitals/Pain Pain Assessment: 0-10 Pain Score: 8  Pain Location: R hip Pain Descriptors / Indicators: Aching;Sore Pain Intervention(s): Premedicated before session;Monitored during session    Home Living                      Prior Function            PT Goals (current goals can now be found in the care plan section) Progress towards PT goals: Not progressing toward goals - comment(Pt limited by pain)    Frequency    BID      PT Plan Current plan remains appropriate    Co-evaluation              AM-PAC PT "6 Clicks" Mobility   Outcome Measure  Help needed turning from your back to your  side while in a flat bed without using bedrails?: A Lot Help needed moving from lying on your back to sitting on the side of a flat bed without using bedrails?: A Lot Help needed moving to and from a bed to a chair (including a wheelchair)?: Total Help needed standing up from a chair using your arms (e.g., wheelchair or bedside chair)?: A Lot Help needed to walk in hospital room?: Total Help needed climbing 3-5 steps with a railing? : Total 6 Click Score: 9    End of Session   Activity Tolerance: Patient limited by pain Patient left: in bed;with call bell/phone within reach;with bed alarm set;with family/visitor present;with SCD's reapplied;Other (comment)(Ice bag to R hip) Nurse Communication: Mobility status PT Visit Diagnosis: Unsteadiness on feet (R26.81);Muscle weakness (generalized) (M62.81);Pain;Other abnormalities of gait and mobility (R26.89) Pain - Right/Left: Right Pain - part of body: Hip     Time: 1452-1510 PT Time Calculation (min) (ACUTE ONLY): 18 min  Charges:  $Therapeutic Exercise: 8-22  mins                     D. Scott Kaydn Kumpf PT, DPT 05/10/19, 5:01 PM

## 2019-05-11 DIAGNOSIS — Z8781 Personal history of (healed) traumatic fracture: Secondary | ICD-10-CM

## 2019-05-11 DIAGNOSIS — Z01811 Encounter for preprocedural respiratory examination: Secondary | ICD-10-CM

## 2019-05-11 DIAGNOSIS — R1310 Dysphagia, unspecified: Secondary | ICD-10-CM

## 2019-05-11 DIAGNOSIS — R1319 Other dysphagia: Secondary | ICD-10-CM

## 2019-05-11 LAB — BASIC METABOLIC PANEL
Anion gap: 10 (ref 5–15)
BUN: 20 mg/dL (ref 8–23)
CO2: 22 mmol/L (ref 22–32)
Calcium: 9.4 mg/dL (ref 8.9–10.3)
Chloride: 106 mmol/L (ref 98–111)
Creatinine, Ser: 0.86 mg/dL (ref 0.44–1.00)
GFR calc Af Amer: >60 mL/min (ref >60)
GFR calc non Af Amer: >60 mL/min (ref >60)
Glucose, Bld: 96 mg/dL (ref 70–99)
Potassium: 3.4 mmol/L — ABNORMAL LOW (ref 3.5–5.1)
Sodium: 138 mmol/L (ref 135–145)

## 2019-05-11 LAB — CBC
HCT: 27.6 % — ABNORMAL LOW (ref 36.0–46.0)
Hemoglobin: 9 g/dL — ABNORMAL LOW (ref 12.0–15.0)
MCH: 30 pg (ref 26.0–34.0)
MCHC: 32.6 g/dL (ref 30.0–36.0)
MCV: 92 fL (ref 80.0–100.0)
Platelets: 409 10*3/uL — ABNORMAL HIGH (ref 150–400)
RBC: 3 MIL/uL — ABNORMAL LOW (ref 3.87–5.11)
RDW: 14.6 % (ref 11.5–15.5)
WBC: 11.3 10*3/uL — ABNORMAL HIGH (ref 4.0–10.5)
nRBC: 0.2 % (ref 0.0–0.2)

## 2019-05-11 MED ORDER — TRAMADOL HCL 50 MG PO TABS: 50.0000 mg | ORAL_TABLET | Freq: Two times a day (BID) | ORAL | 0 refills | Status: DC | PRN

## 2019-05-11 MED ORDER — BISACODYL 10 MG RE SUPP: 10.0000 mg | Freq: Every day | RECTAL | 0 refills | Status: AC | PRN

## 2019-05-11 MED ORDER — ENOXAPARIN SODIUM 40 MG/0.4ML ~~LOC~~ SOLN: 40.0000 mg | SUBCUTANEOUS | Status: AC

## 2019-05-11 MED ORDER — DOCUSATE SODIUM 100 MG PO CAPS: 100.0000 mg | ORAL_CAPSULE | Freq: Two times a day (BID) | ORAL | 0 refills | Status: AC

## 2019-05-11 MED ORDER — ENSURE ENLIVE PO LIQD: 237.0000 mL | Freq: Two times a day (BID) | ORAL | 12 refills | Status: AC

## 2019-05-11 MED ORDER — NICOTINE 14 MG/24HR TD PT24: 14.0000 mg | MEDICATED_PATCH | Freq: Every day | TRANSDERMAL | 0 refills | Status: DC

## 2019-05-11 MED ORDER — HYDROCODONE-ACETAMINOPHEN 5-325 MG PO TABS: 1.0000 | ORAL_TABLET | Freq: Four times a day (QID) | ORAL | 0 refills | Status: DC | PRN

## 2019-05-11 MED ORDER — FE FUMARATE-B12-VIT C-FA-IFC PO CAPS: 1.0000 | ORAL_CAPSULE | Freq: Two times a day (BID) | ORAL | Status: AC

## 2019-05-11 MED ORDER — ALUM & MAG HYDROXIDE-SIMETH 200-200-20 MG/5ML PO SUSP: 30.0000 mL | ORAL | 0 refills | Status: AC | PRN

## 2019-05-11 MED ORDER — PROPRANOLOL HCL 20 MG PO TABS: 20.0000 mg | ORAL_TABLET | Freq: Two times a day (BID) | ORAL | Status: DC

## 2019-05-11 NOTE — Discharge Summary (Signed)
Physician Discharge Summary  Holly Shelton ZRA:[id number] DOB: Dec 02, 1940 DOA: 05/06/2019  PCP: Patient, No Pcp Per  Admit date: 05/06/2019 Discharge date: 05/11/2019  Admitted From: Home Disposition: SNF  Recommendations for Outpatient Follow-up:  1. Follow up with PCP in 1-2 weeks 2. Follow-up with orthopedic in 2 weeks 3. Follow-up with gastroenterology 4. Please obtain BMP/CBC in one week 5. Please follow up on the following pending results: None  Home Health: No Equipment/Devices: Rolling walker Discharge Condition: Fair CODE STATUS: Full Diet recommendation: Heart Healthy   Brief/Interim Summary: 79 year old female with hypertension, COPD with active tobacco use, GERD, AAA of descending thoracic aorta and recurrent UTIs on nitrofurantoin presented to the ED with mechanical fall and sustained right hip pain. In the ED x-ray of the hip showed transverse displaced and impacted right subtrochanteric fracture. Blood work showed mild AKI with creatinine of 1.18. Admitted for further management and taken to the OR on 05/07/19 ORIF.  Patient tolerated the procedure well.  There was some initial problems with pain control due to persistent hypotension postoperatively requiring multiple boluses. Blood pressure stabilizes within 48 hours.  We held the home dose of propanolol and amlodipine during hospitalization.  Blood pressure was within goal on discharge.  On discharge her home dose of propanol was decreased to 20 mg twice daily and amlodipine was discontinued.  She needs to follow-up with her primary care physician for further management and restart of blood pressure medications if needed.  Patient also has mild AKI which resolved with IV hydration.  Patient was on nitrofurantoin chronically UTIs.  Nitrofurantoin was held during hospitalization due to AKI and she can resume on discharge.  She needs monitoring of her renal function and her PCP should be able to manage the use of nitrofurantoin if  needed.  Patient did develop anemia secondary to blood loss with surgery.  Hemoglobin was stable at 9 on discharge and she was provided with iron supplement.  Patient was complaining of difficulty swallowing and occasional coughing with food.  Swallow evaluation was done which shows normal pharyngeal mode and low risk for aspiration.  DG esophagus with barium was done due to her complaint of difficulty swallowing and found to have some motility dysfunction and distal esophagus.  She was provided with referral to see a gastroenterologist as an outpatient.  She will continue with PPI.  Patient experienced some left-sided lower chest pain 2 days ago which was thought to be due to musculoskeletal.  EKG without any acute change and troponin remain negative. She had another episode of right-sided chest pain which resolved spontaneously, most likely musculoskeletal.  No red flag symptoms. -Echo was ordered-EF of 55 to 60% with grade 1 diastolic dysfunction.  Patient has an history of descending thoracic aortic aneurysm which is being managed by cardiothoracic surgery at [facility].Last CT of the chest from 9///2019 at [facility] showing aneurysmal dilatation measuring 5.2 x 5.8 cm.  Patient will continue her scheduled follow-up with them.  Has an history of COPD and continues to smoke.  She was counseled on cessation.  No acute exacerbation during current hospitalization.  She will continue with her home bronchodilators and also provided with nicotine patch to help with smoking cessation.  Discharge Diagnoses:  Active Problems:   Closed displaced subtrochanteric fracture of right femur (HCC)   COPD (chronic obstructive pulmonary disease) (HCC)   Essential hypertension   Tobacco use   History of recurrent UTI (urinary tract infection)   GERD (gastroesophageal reflux disease)   Aneurysm of descending  thoracic aorta (HCC)   Closed fracture of right hip [facility])   Fall   Discharge Instructions  Discharge  Instructions    Ambulatory referral to Gastroenterology   Complete by: As directed    To evaluate for esophageal dysmotility.   What is the reason for referral?: Other   Diet - low sodium heart healthy   Complete by: As directed    Increase activity slowly   Complete by: As directed      Allergies as of 05/11/2019      Reactions   Penicillins Rash      Medication List    STOP taking these medications   amLODipine 5 MG tablet Commonly known as: NORVASC   gabapentin 600 MG tablet Commonly known as: NEURONTIN     TAKE these medications   albuterol 108 (90 Base) MCG/ACT inhaler Commonly known as: VENTOLIN HFA Inhale 2 puffs into the lungs every 6 (six) hours as needed.   alum & mag hydroxide-simeth 200-200-20 MG/5ML suspension Commonly known as: MAALOX/MYLANTA Take 30 mLs by mouth every 4 (four) hours as needed for indigestion.   aspirin EC 81 MG tablet Take 81 mg by mouth at bedtime.   B-complex with vitamin C tablet Take 1 tablet by mouth daily.   bisacodyl 10 MG suppository Commonly known as: DULCOLAX Place 1 suppository (10 mg total) rectally daily as needed for moderate constipation.   Calcium 600-200 MG-UNIT tablet Take 1 tablet by mouth daily.   calcium acetate (Phos Binder) 667 MG/5ML Soln Commonly known as: PHOSLYRA Take 667 mg by mouth in the morning and at bedtime.   cholecalciferol 25 MCG (1000 UNIT) tablet Commonly known as: VITAMIN D3 Take 1,000 Units by mouth daily.   docusate sodium 100 MG capsule Commonly known as: COLACE Take 1 capsule (100 mg total) by mouth 2 (two) times daily.   enoxaparin 40 MG/0.4ML injection Commonly known as: LOVENOX Inject 0.4 mLs (40 mg total) into the skin daily for 14 days.   feeding supplement (ENSURE ENLIVE) Liqd Take 237 mLs by mouth 2 (two) times daily between meals.   ferrous fumarate-b12-vitamic C-folic acid capsule Commonly known as: TRINSICON / FOLTRIN Take 1 capsule by mouth 2 (two) times daily.    HYDROcodone-acetaminophen 5-325 MG tablet Commonly known as: NORCO/VICODIN Take 1-2 tablets by mouth every 6 (six) hours as needed for moderate pain.   loratadine 10 MG tablet Commonly known as: CLARITIN Take 10 mg by mouth daily.   nicotine 14 mg/24hr patch Commonly known as: NICODERM CQ - dosed in mg/24 hours Place 1 patch (14 mg total) onto the skin daily.   nitrofurantoin 50 MG capsule Commonly known as: MACRODANTIN Take 50 mg by mouth at bedtime.   omeprazole 40 MG capsule Commonly known as: PRILOSEC Take 40 mg by mouth daily.   oxybutynin 5 MG tablet Commonly known as: DITROPAN Take 5 mg by mouth 3 (three) times daily.   pregabalin 100 MG capsule Commonly known as: LYRICA Take 100 mg by mouth 3 (three) times daily.   propranolol 20 MG tablet Commonly known as: INDERAL Take 1 tablet (20 mg total) by mouth 2 (two) times daily. What changed:   medication strength  how much to take  when to take this   rosuvastatin 10 MG tablet Commonly known as: CRESTOR Take 10 mg by mouth at bedtime.   Spiriva HandiHaler 18 MCG inhalation capsule Generic drug: tiotropium Place 1 capsule into inhaler and inhale daily.   traMADol 50 MG tablet Commonly known  as: ULTRAM Take 1 tablet (50 mg total) by mouth every 12 (twelve) hours as needed for moderate pain.   Wixela Inhub 500-50 MCG/DOSE Aepb Generic drug: Fluticasone-Salmeterol Inhale 1 puff into the lungs 2 (two) times daily.       Contact information for follow-up providers    Evon SlackGaines, Thomas C, PA-C Follow up in 2 day(s).   Specialties: Orthopedic Surgery, Emergency Medicine Contact information: [address] [place] [address] [phone]            Contact information for after-discharge care    Destination    [facility] .   Service: Skilled Nursing Contact information: [address] [place] [id number] [phone]                  Allergies  Allergen Reactions  . Penicillins Rash    Consultations:  Orthopedics  Procedures/Studies: DG Chest 1 View  Result Date: 05/06/2019 CLINICAL DATA:  Recent fall with chest pain, initial encounter EXAM: CHEST  1 VIEW COMPARISON:  05/19/2013 FINDINGS: Cardiac shadow is enlarged. Thoracic aorta is tortuous and accentuated by mild patient rotation. The lungs are clear bilaterally. No acute bony abnormality is seen. IMPRESSION: Cardiomegaly and tortuous aorta.  No acute abnormality is seen. Electronically Signed   By: Alcide CleverMark  Lukens M.D.   On: 05/06/2019 19:03   ECHOCARDIOGRAM COMPLETE  Result Date: 05/08/2019    ECHOCARDIOGRAM REPORT   Patient Name:   Holly Shelton Date of Exam: 05/08/2019 Medical Rec #:  [id number]      Height:       60.0 in Accession #:    [id number]     Weight:       156.5 lb Date of Birth:  1940-12-04      BSA:          1.682 m Patient Age:    78 years       BP:           87/57 mmHg Patient Gender: F              HR:           74 bpm. Exam Location:  [facility] Procedure: 2D Echo, Color Doppler and Cardiac Doppler Indications:     R07.9 Chest Pain  History:         Patient has no prior history of Echocardiogram examinations.                  Risk Factors:Hypertension.  Sonographer:     Humphrey RollsJoan Heiss RDCS (AE) Referring Phys:  [id number] [facility] Diagnosing Phys: Lorine BearsMuhammad Arida MD  Sonographer Comments: Technically difficult study due to poor echo windows. TDS due to inability to position pt due to broken hip. IMPRESSIONS  1. Left ventricular ejection fraction, by estimation, is 55 to 60%. The left ventricle has normal function. Left ventricular endocardial border not optimally defined to evaluate regional wall motion. There is mild left ventricular hypertrophy. Left ventricular diastolic parameters are consistent with Grade I diastolic dysfunction (impaired relaxation).  2. Right ventricular systolic function is normal. The right ventricular size is normal. Tricuspid regurgitation  signal is inadequate for assessing PA pressure.  3. The mitral valve is normal in structure. No evidence of mitral valve regurgitation. No evidence of mitral stenosis.  4. The aortic valve is normal in structure. Aortic valve regurgitation is not visualized. No aortic stenosis is present.  5. The inferior vena cava is normal in size with greater than  50% respiratory variability, suggesting right atrial pressure of 3 mmHg. FINDINGS  Left Ventricle: Left ventricular ejection fraction, by estimation, is 55 to 60%. The left ventricle has normal function. Left ventricular endocardial border not optimally defined to evaluate regional wall motion. The left ventricular internal cavity size was normal in size. There is mild left ventricular hypertrophy. Left ventricular diastolic parameters are consistent with Grade I diastolic dysfunction (impaired relaxation). Right Ventricle: The right ventricular size is normal. No increase in right ventricular wall thickness. Right ventricular systolic function is normal. Tricuspid regurgitation signal is inadequate for assessing PA pressure. Left Atrium: Left atrial size was normal in size. Right Atrium: Right atrial size was normal in size. Pericardium: There is no evidence of pericardial effusion. Mitral Valve: The mitral valve is normal in structure. Normal mobility of the mitral valve leaflets. No evidence of mitral valve regurgitation. No evidence of mitral valve stenosis. MV peak gradient, 3.2 mmHg. The mean mitral valve gradient is 1.0 mmHg. Tricuspid Valve: The tricuspid valve is normal in structure. Tricuspid valve regurgitation is trivial. No evidence of tricuspid stenosis. Aortic Valve: The aortic valve is normal in structure. Aortic valve regurgitation is not visualized. No aortic stenosis is present. Aortic valve mean gradient measures 4.0 mmHg. Aortic valve peak gradient measures 9.5 mmHg. Aortic valve area, by VTI measures 3.58 cm. Pulmonic Valve: The pulmonic valve was  normal in structure. Pulmonic valve regurgitation is not visualized. No evidence of pulmonic stenosis. Aorta: The aortic root is normal in size and structure. Venous: The inferior vena cava is normal in size with greater than 50% respiratory variability, suggesting right atrial pressure of 3 mmHg. IAS/Shunts: No atrial level shunt detected by color flow Doppler.  LEFT VENTRICLE PLAX 2D LVIDd:         3.35 cm  Diastology LVIDs:         2.08 cm  LV e' lateral:   6.09 cm/s LV PW:         0.83 cm  LV E/e' lateral: 8.9 LV IVS:        0.67 cm  LV e' medial:    6.42 cm/s LVOT diam:     2.40 cm  LV E/e' medial:  8.5 LV SV:         77 LV SV Index:   46 LVOT Area:     4.52 cm  LEFT ATRIUM           Index LA diam:      4.10 cm 2.44 cm/m LA Vol (A4C): 49.7 ml 29.55 ml/m  AORTIC VALVE                   PULMONIC VALVE AV Area (Vmax):    2.80 cm    PV Vmax:       1.05 m/s AV Area (Vmean):   3.42 cm    PV Vmean:      65.200 cm/s AV Area (VTI):     3.58 cm    PV VTI:        0.176 m AV Vmax:           154.00 cm/s PV Peak grad:  4.4 mmHg AV Vmean:          92.100 cm/s PV Mean grad:  2.0 mmHg AV VTI:            0.216 m AV Peak Grad:      9.5 mmHg AV Mean Grad:      4.0 mmHg LVOT Vmax:  95.30 cm/s LVOT Vmean:        69.600 cm/s LVOT VTI:          0.171 m LVOT/AV VTI ratio: 0.79  AORTA Ao Root diam: 3.40 cm MITRAL VALVE MV Area (PHT): 4.68 cm    SHUNTS MV Peak grad:  3.2 mmHg    Systemic VTI:  0.17 m MV Mean grad:  1.0 mmHg    Systemic Diam: 2.40 cm MV Vmax:       0.89 m/s MV Vmean:      41.3 cm/s MV Decel Time: 162 msec MV E velocity: 54.30 cm/s MV A velocity: 79.50 cm/s MV E/A ratio:  0.68 Lorine Bears MD Electronically signed by Lorine Bears MD Signature Date/Time: 05/08/2019/4:50:52 PM    Final    DG HIP PORT UNILAT WITH PELVIS 1V RIGHT  Result Date: 05/09/2019 CLINICAL DATA:  RIGHT hip pain, hip surgery on 05/07/2019 EXAM: DG HIP (WITH OR WITHOUT PELVIS) 1V PORT RIGHT COMPARISON:  05/07/2019 FINDINGS: IM nail with  compression screw at proximal RIGHT femur post ORIF. Bones demineralized. No additional fracture, dislocation, or bone destruction. Degenerative disc and facet disease changes of visualized lower lumbar spine. IMPRESSION: Osseous demineralization post ORIF of proximal RIGHT femoral fracture. No new abnormalities or significant change since 05/07/2019. Electronically Signed   By: Ulyses Southward M.D.   On: 05/09/2019 09:57   DG HIP OPERATIVE UNILAT W OR W/O PELVIS RIGHT  Result Date: 05/07/2019 CLINICAL DATA:  Fracture fixation. Subtrochanteric fracture of the right hip. EXAM: OPERATIVE right HIP (WITH PELVIS IF PERFORMED) 6 VIEWS TECHNIQUE: Fluoroscopic spot image(s) were submitted for interpretation post-operatively. COMPARISON:  Radiographs 05/06/2019 FINDINGS: Multiple intraoperative fluoroscopic spot images demonstrate placement of a long intramedullary gamma nail and the proximal dynamic hip screw and single distal interlocking screw. Good position and alignment of the fracture with anatomic reduction. IMPRESSION: Anatomic reduction of the right hip fracture with internal fixation. Electronically Signed   By: Rudie Meyer M.D.   On: 05/07/2019 12:45   DG Hip Unilat W or Wo Pelvis 2-3 Views Right  Result Date: 05/06/2019 CLINICAL DATA:  Right hip pain post fall. EXAM: DG HIP (WITH OR WITHOUT PELVIS) 2-3V RIGHT COMPARISON:  None. FINDINGS: There is a transverse severely displaced and impacted subtrochanteric fracture of the proximal right femoral shaft. The right femoral head is normally located within the acetabulum. IMPRESSION: Transverse severely displaced and impacted subtrochanteric fracture of the proximal right femoral shaft. Electronically Signed   By: Ted Mcalpine M.D.   On: 05/06/2019 19:06   DG ESOPHAGUS W SINGLE CM (SOL OR THIN BA)  Result Date: 05/10/2019 CLINICAL DATA:  Difficulty swallowing with pain EXAM: ESOPHOGRAM/BARIUM SWALLOW TECHNIQUE: Single contrast examination was performed  using  thin barium. FLUOROSCOPY TIME:  Fluoroscopy Time:  2 minutes Radiation Exposure Index (if provided by the fluoroscopic device): 26 mGy Number of Acquired Spot Images: Multiple cine fluoroscopic runs COMPARISON:  None. FINDINGS: Swallowing mechanism is within normal limits. There is normal esophageal transit proximally. No definitive mucosal abnormality is seen. The degree of motility in the mid to distal esophagus however is significantly decreased. Some extrinsic compression upon the distal esophagus is noted which may be related to the distal descending thoracic aorta or enlarged left atrium. This somewhat limits the passage of contrast material into the stomach although a true mechanical obstruction is not present. IMPRESSION: Decreased motility in the mid to distal esophagus. Some extrinsic compression upon the distal esophagus is noted likely related to prominent left atrium  or the ectatic aorta. Electronically Signed   By: Alcide Clever M.D.   On: 05/10/2019 16:26    Subjective: Pt. Was feeling better when seen today. No new complaints.  Discharge Exam: Vitals:   05/10/19 1703 05/11/19 0802  BP: 118/70 110/69  Pulse: 93 92  Resp:  20  Temp: 98 F (36.7 C) 98 F (36.7 C)  SpO2: 98% 98%   Vitals:   05/10/19 0023 05/10/19 0739 05/10/19 1703 05/11/19 0802  BP: (!) 116/52 114/64 118/70 110/69  Pulse: 82 97 93 92  Resp: 14 20  20   Temp: 98 F (36.7 C) 98.2 F (36.8 C) 98 F (36.7 C) 98 F (36.7 C)  TempSrc: Oral Oral Oral Axillary  SpO2: 96% 97% 98% 98%  Weight:      Height:        General: Pt is alert, awake, not in acute distress Cardiovascular: RRR, S1/S2 +, no rubs, no gallops Respiratory: CTA bilaterally, no wheezing, no rhonchi Abdominal: Soft, NT, ND, bowel sounds + Extremities: no edema, no cyanosis   The results of significant diagnostics from this hospitalization (including imaging, microbiology, ancillary and laboratory) are listed below for reference.     Microbiology: Recent Results (from the past 240 hour(s))  Respiratory Panel by RT PCR (Flu A&B, Covid) - Nasopharyngeal Swab     Status: None   Collection Time: 05/06/19  7:16 PM   Specimen: Nasopharyngeal Swab  Result Value Ref Range Status   SARS Coronavirus 2 by RT PCR NEGATIVE NEGATIVE Final    Comment: (NOTE) SARS-CoV-2 target nucleic acids are NOT DETECTED. The SARS-CoV-2 RNA is generally detectable in upper respiratoy specimens during the acute phase of infection. The lowest concentration of SARS-CoV-2 viral copies this assay can detect is 131 copies/mL. A negative result does not preclude SARS-Cov-2 infection and should not be used as the sole basis for treatment or other patient management decisions. A negative result may occur with  improper specimen collection/handling, submission of specimen other than nasopharyngeal swab, presence of viral mutation(s) within the areas targeted by this assay, and inadequate number of viral copies (<131 copies/mL). A negative result must be combined with clinical observations, patient history, and epidemiological information. The expected result is Negative. Fact Sheet for Patients:  https://www.moore.com/ Fact Sheet for Healthcare Providers:  https://www.young.biz/ This test is not yet ap proved or cleared by the [place] FDA and  has been authorized for detection and/or diagnosis of SARS-CoV-2 by FDA under an Emergency Use Authorization (EUA). This EUA will remain  in effect (meaning this test can be used) for the duration of the COVID-19 declaration under Section 564(b)(1) of the Act, 21 U.S.C. section 360bbb-3(b)(1), unless the authorization is terminated or revoked sooner.    Influenza A by PCR NEGATIVE NEGATIVE Final   Influenza B by PCR NEGATIVE NEGATIVE Final    Comment: (NOTE) The Xpert Xpress SARS-CoV-2/FLU/RSV assay is intended as an aid in  the diagnosis of influenza from  Nasopharyngeal swab specimens and  should not be used as a sole basis for treatment. Nasal washings and  aspirates are unacceptable for Xpert Xpress SARS-CoV-2/FLU/RSV  testing. Fact Sheet for Patients: https://www.moore.com/ Fact Sheet for Healthcare Providers: https://www.young.biz/ This test is not yet approved or cleared by the [place] FDA and  has been authorized for detection and/or diagnosis of SARS-CoV-2 by  FDA under an Emergency Use Authorization (EUA). This EUA will remain  in effect (meaning this test can be used) for the duration of the  Covid-19 declaration under Section 564(b)(1) of the Act, 21  U.S.C. section 360bbb-3(b)(1), unless the authorization is  terminated or revoked. Performed at [facility], [address] [place], [place] [address]   MRSA PCR Screening     Status: None   Collection Time: 05/07/19  4:23 AM   Specimen: Nasal Mucosa; Nasopharyngeal  Result Value Ref Range Status   MRSA by PCR NEGATIVE NEGATIVE Final    Comment:        The GeneXpert MRSA Assay (FDA approved for NASAL specimens only), is one component of a comprehensive MRSA colonization surveillance program. It is not intended to diagnose MRSA infection nor to guide or monitor treatment for MRSA infections. Performed at [facility], [address] [place], [place] [address]   Respiratory Panel by RT PCR (Flu A&B, Covid) - Nasopharyngeal Swab     Status: None   Collection Time: 05/10/19 12:35 PM   Specimen: Nasopharyngeal Swab  Result Value Ref Range Status   SARS Coronavirus 2 by RT PCR NEGATIVE NEGATIVE Final    Comment: (NOTE) SARS-CoV-2 target nucleic acids are NOT DETECTED. The SARS-CoV-2 RNA is generally detectable in upper respiratoy specimens during the acute phase of infection. The lowest concentration of SARS-CoV-2 viral copies this assay can detect is 131 copies/mL. A negative result does not preclude  SARS-Cov-2 infection and should not be used as the sole basis for treatment or other patient management decisions. A negative result may occur with  improper specimen collection/handling, submission of specimen other than nasopharyngeal swab, presence of viral mutation(s) within the areas targeted by this assay, and inadequate number of viral copies (<131 copies/mL). A negative result must be combined with clinical observations, patient history, and epidemiological information. The expected result is Negative. Fact Sheet for Patients:  PinkCheek.be Fact Sheet for Healthcare Providers:  GravelBags.it This test is not yet ap proved or cleared by the [place] FDA and  has been authorized for detection and/or diagnosis of SARS-CoV-2 by FDA under an Emergency Use Authorization (EUA). This EUA will remain  in effect (meaning this test can be used) for the duration of the COVID-19 declaration under Section 564(b)(1) of the Act, 21 U.S.C. section 360bbb-3(b)(1), unless the authorization is terminated or revoked sooner.    Influenza A by PCR NEGATIVE NEGATIVE Final   Influenza B by PCR NEGATIVE NEGATIVE Final    Comment: (NOTE) The Xpert Xpress SARS-CoV-2/FLU/RSV assay is intended as an aid in  the diagnosis of influenza from Nasopharyngeal swab specimens and  should not be used as a sole basis for treatment. Nasal washings and  aspirates are unacceptable for Xpert Xpress SARS-CoV-2/FLU/RSV  testing. Fact Sheet for Patients: PinkCheek.be Fact Sheet for Healthcare Providers: GravelBags.it This test is not yet approved or cleared by the [place] FDA and  has been authorized for detection and/or diagnosis of SARS-CoV-2 by  FDA under an Emergency Use Authorization (EUA). This EUA will remain  in effect (meaning this test can be used) for the duration of the  Covid-19  declaration under Section 564(b)(1) of the Act, 21  U.S.C. section 360bbb-3(b)(1), unless the authorization is  terminated or revoked. Performed at [facility], [address] [place], [place] [address]      Labs: BNP (last 3 results) No results for input(s): BNP in the last 8760 hours. Basic Metabolic Panel: Recent Labs  Lab 05/07/19 0507 05/08/19 0419 05/09/19 0511 05/10/19 0448 05/11/19 0606  NA 139 134* 135 138 138  K 3.5 4.0  4.0 3.8 3.4*  CL 98 97* 102 105 106  CO2 26 27 24 22 22   GLUCOSE 145* 96 92 85 96  BUN 19 24* 26* 23 20  CREATININE 1.44* 1.79* 1.41* 1.04* 0.86  CALCIUM 9.3 8.5* 9.1 9.3 9.4   Liver Function Tests: No results for input(s): AST, ALT, ALKPHOS, BILITOT, PROT, ALBUMIN in the last 168 hours. No results for input(s): LIPASE, AMYLASE in the last 168 hours. No results for input(s): AMMONIA in the last 168 hours. CBC: Recent Labs  Lab 05/06/19 1808 05/06/19 1808 05/07/19 0507 05/08/19 0419 05/09/19 0511 05/10/19 0448 05/11/19 0606  WBC 13.0*   < > 17.5* 13.9* 12.6* 13.1* 11.3*  NEUTROABS 10.3*  --   --   --   --   --   --   HGB 12.3   < > 12.2 9.6* 8.8* 8.8* 9.0*  HCT 36.5   < > 37.0 29.6* 26.1* 27.7* 27.6*  MCV 89.2   < > 91.1 92.2 90.9 93.0 92.0  PLT 452*   < > 409* 315 302 382 409*   < > = values in this interval not displayed.   Cardiac Enzymes: No results for input(s): CKTOTAL, CKMB, CKMBINDEX, TROPONINI in the last 168 hours. BNP: Invalid input(s): POCBNP CBG: No results for input(s): GLUCAP in the last 168 hours. D-Dimer No results for input(s): DDIMER in the last 72 hours. Hgb A1c No results for input(s): HGBA1C in the last 72 hours. Lipid Profile No results for input(s): CHOL, HDL, LDLCALC, TRIG, CHOLHDL, LDLDIRECT in the last 72 hours. Thyroid function studies No results for input(s): TSH, T4TOTAL, T3FREE, THYROIDAB in the last 72 hours.  Invalid input(s): FREET3 Anemia work up No results for input(s):  VITAMINB12, FOLATE, FERRITIN, TIBC, IRON, RETICCTPCT in the last 72 hours. Urinalysis    Component Value Date/Time   COLORURINE Amber 05/19/2013 1200   APPEARANCEUR Cloudy 05/19/2013 1200   LABSPEC 1.015 05/19/2013 1200   PHURINE 6.0 05/19/2013 1200   GLUCOSEU Negative 05/19/2013 1200   HGBUR 1+ 05/19/2013 1200   BILIRUBINUR Negative 05/19/2013 1200   KETONESUR Negative 05/19/2013 1200   PROTEINUR 30 mg/dL 05/21/2013 34/19/3790   NITRITE Negative 05/19/2013 1200   LEUKOCYTESUR 2+ 05/19/2013 1200   Sepsis Labs Invalid input(s): PROCALCITONIN,  WBC,  LACTICIDVEN Microbiology Recent Results (from the past 240 hour(s))  Respiratory Panel by RT PCR (Flu A&B, Covid) - Nasopharyngeal Swab     Status: None   Collection Time: 05/06/19  7:16 PM   Specimen: Nasopharyngeal Swab  Result Value Ref Range Status   SARS Coronavirus 2 by RT PCR NEGATIVE NEGATIVE Final    Comment: (NOTE) SARS-CoV-2 target nucleic acids are NOT DETECTED. The SARS-CoV-2 RNA is generally detectable in upper respiratoy specimens during the acute phase of infection. The lowest concentration of SARS-CoV-2 viral copies this assay can detect is 131 copies/mL. A negative result does not preclude SARS-Cov-2 infection and should not be used as the sole basis for treatment or other patient management decisions. A negative result may occur with  improper specimen collection/handling, submission of specimen other than nasopharyngeal swab, presence of viral mutation(s) within the areas targeted by this assay, and inadequate number of viral copies (<131 copies/mL). A negative result must be combined with clinical observations, patient history, and epidemiological information. The expected result is Negative. Fact Sheet for Patients:  07/06/19 Fact Sheet for Healthcare Providers:  https://www.moore.com/ This test is not yet ap proved or cleared by the https://www.young.biz/ FDA and  has  been authorized for detection and/or diagnosis of SARS-CoV-2 by FDA under an Emergency Use Authorization (EUA). This EUA will remain  in effect (meaning this test can be used) for the duration of the COVID-19 declaration under Section 564(b)(1) of the Act, 21 U.S.C. section 360bbb-3(b)(1), unless the authorization is terminated or revoked sooner.    Influenza A by PCR NEGATIVE NEGATIVE Final   Influenza B by PCR NEGATIVE NEGATIVE Final    Comment: (NOTE) The Xpert Xpress SARS-CoV-2/FLU/RSV assay is intended as an aid in  the diagnosis of influenza from Nasopharyngeal swab specimens and  should not be used as a sole basis for treatment. Nasal washings and  aspirates are unacceptable for Xpert Xpress SARS-CoV-2/FLU/RSV  testing. Fact Sheet for Patients: https://www.moore.com/ Fact Sheet for Healthcare Providers: https://www.young.biz/ This test is not yet approved or cleared by the [place] FDA and  has been authorized for detection and/or diagnosis of SARS-CoV-2 by  FDA under an Emergency Use Authorization (EUA). This EUA will remain  in effect (meaning this test can be used) for the duration of the  Covid-19 declaration under Section 564(b)(1) of the Act, 21  U.S.C. section 360bbb-3(b)(1), unless the authorization is  terminated or revoked. Performed at [facility], [address] [place], [place] [address]   MRSA PCR Screening     Status: None   Collection Time: 05/07/19  4:23 AM   Specimen: Nasal Mucosa; Nasopharyngeal  Result Value Ref Range Status   MRSA by PCR NEGATIVE NEGATIVE Final    Comment:        The GeneXpert MRSA Assay (FDA approved for NASAL specimens only), is one component of a comprehensive MRSA colonization surveillance program. It is not intended to diagnose MRSA infection nor to guide or monitor treatment for MRSA infections. Performed at [facility], [address] [place], [place]  [address]   Respiratory Panel by RT PCR (Flu A&B, Covid) - Nasopharyngeal Swab     Status: None   Collection Time: 05/10/19 12:35 PM   Specimen: Nasopharyngeal Swab  Result Value Ref Range Status   SARS Coronavirus 2 by RT PCR NEGATIVE NEGATIVE Final    Comment: (NOTE) SARS-CoV-2 target nucleic acids are NOT DETECTED. The SARS-CoV-2 RNA is generally detectable in upper respiratoy specimens during the acute phase of infection. The lowest concentration of SARS-CoV-2 viral copies this assay can detect is 131 copies/mL. A negative result does not preclude SARS-Cov-2 infection and should not be used as the sole basis for treatment or other patient management decisions. A negative result may occur with  improper specimen collection/handling, submission of specimen other than nasopharyngeal swab, presence of viral mutation(s) within the areas targeted by this assay, and inadequate number of viral copies (<131 copies/mL). A negative result must be combined with clinical observations, patient history, and epidemiological information. The expected result is Negative. Fact Sheet for Patients:  https://www.moore.com/ Fact Sheet for Healthcare Providers:  https://www.young.biz/ This test is not yet ap proved or cleared by the [place] FDA and  has been authorized for detection and/or diagnosis of SARS-CoV-2 by FDA under an Emergency Use Authorization (EUA). This EUA will remain  in effect (meaning this test can be used) for the duration of the COVID-19 declaration under Section 564(b)(1) of the Act, 21 U.S.C. section 360bbb-3(b)(1), unless the authorization is terminated or revoked sooner.    Influenza A by PCR NEGATIVE NEGATIVE Final   Influenza B by PCR NEGATIVE NEGATIVE Final    Comment: (NOTE) The Xpert Xpress SARS-CoV-2/FLU/RSV  assay is intended as an aid in  the diagnosis of influenza from Nasopharyngeal swab specimens and  should not be used as a  sole basis for treatment. Nasal washings and  aspirates are unacceptable for Xpert Xpress SARS-CoV-2/FLU/RSV  testing. Fact Sheet for Patients: https://www.moore.com/ Fact Sheet for Healthcare Providers: https://www.young.biz/ This test is not yet approved or cleared by the [place] FDA and  has been authorized for detection and/or diagnosis of SARS-CoV-2 by  FDA under an Emergency Use Authorization (EUA). This EUA will remain  in effect (meaning this test can be used) for the duration of the  Covid-19 declaration under Section 564(b)(1) of the Act, 21  U.S.C. section 360bbb-3(b)(1), unless the authorization is  terminated or revoked. Performed at [facility], [address] [place], [place] [address]     Time coordinating discharge: Over 30 minutes  SIGNED:  Arnetha Courser, MD  [facility] 05/11/2019, 11:14 AM  If 7PM-7AM, please contact night-coverage [email or web address]  This record has been created using Conservation officer, historic buildings. Errors have been sought and corrected,but may not always be located. Such creation errors do not reflect on the standard of care.

## 2019-05-11 NOTE — TOC Transition Note (Signed)
Transition of Care [facility]) - CM/SW Discharge Note   Patient Details  Name: Holly Shelton MRN: [id number] Date of Birth: [birth year]  Transition of Care [facility]) CM/SW Contact:  Barrie Dunker, RN Phone Number: 05/11/2019, 11:58 AM   Clinical Narrative:    Patient to DC to [facility] today, EMS to transport, The bedside nurse is calling report, DC packet on the chart, the Husband Holly Shelton is aware, RNCM called EMS and the patient is next on the list,    Final next level of care: Skilled Nursing Facility Barriers to Discharge: Barriers Resolved   Patient Goals and CMS Choice        Discharge Placement              Patient chooses bed at: [facility] Patient to be transferred to facility by: ems Name of family member notified: Holly Shelton Patient and family notified of of transfer: 05/11/19  Discharge Plan and Services                                     Social Determinants of Health (SDOH) Interventions     Readmission Risk Interventions No flowsheet data found.

## 2019-05-11 NOTE — Progress Notes (Signed)
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 Reviewed med list and discharge instructions. Pt and husband verbalized understanding.  Obtained vitals noted. Compression stockings on BLE. Transported via EMS from [facility] to [facility].

## 2019-05-11 NOTE — Progress Notes (Signed)
PT Cancellation Note  Patient Details Name: Ronetta Molla MRN: [id number] DOB: [birth year]   Cancelled Treatment:     PT attempt. Pt refused requesting pain meds prior to working with therapist. Will inform RN and return shortly    Rushie Chestnut 05/11/2019, 11:38 AM

## 2019-05-11 NOTE — Care Management Important Message (Signed)
Important Message  Patient Details  Name: Holly Shelton MRN: [id number] Date of Birth: 09/14/1940   Medicare Important Message Given:  Yes     Olegario Messier A Alyaan Budzynski 05/11/2019, 10:42 AM

## 2019-05-11 NOTE — Progress Notes (Signed)
   Subjective: 4 Days Post-Op Procedure(s) (LRB): INTRAMEDULLARY (IM) NAIL INTERTROCHANTRIC (Right) Patient reports pain as mild.  Patient is well, and has had no acute complaints or problems Denies any CP, SOB, ABD pain. We will continue therapy today.  Plan is to go Skilled nursing facility after hospital stay.  Objective: Vital signs in last 24 hours: Temp:  [98 F (36.7 C)] 98 F (36.7 C) (04/08 0802) Pulse Rate:  [92-93] 92 (04/08 0802) Resp:  [20] 20 (04/08 0802) BP: (110-118)/(69-70) 110/69 (04/08 0802) SpO2:  [98 %] 98 % (04/08 0802)  Intake/Output from previous day: No intake/output data recorded. Intake/Output this shift: No intake/output data recorded.  Recent Labs    05/09/19 0511 05/10/19 0448 05/11/19 0606  HGB 8.8* 8.8* 9.0*   Recent Labs    05/10/19 0448 05/11/19 0606  WBC 13.1* 11.3*  RBC 2.98* 3.00*  HCT 27.7* 27.6*  PLT 382 409*   Recent Labs    05/10/19 0448 05/11/19 0606  NA 138 138  K 3.8 3.4*  CL 105 106  CO2 22 22  BUN 23 20  CREATININE 1.04* 0.86  GLUCOSE 85 96  CALCIUM 9.3 9.4   No results for input(s): LABPT, INR in the last 72 hours.  EXAM General - Patient is Alert, Appropriate and Oriented Extremity - Neurovascular intact Sensation intact distally Intact pulses distally Dorsiflexion/Plantar flexion intact No cellulitis present Compartment soft Dressing - moderate drainage Motor Function - intact, moving foot and toes well on exam.   Past Medical History:  Diagnosis Date  . Hypertension     Assessment/Plan:   4 Days Post-Op Procedure(s) (LRB): INTRAMEDULLARY (IM) NAIL INTERTROCHANTRIC (Right) Active Problems:   Closed displaced subtrochanteric fracture of right femur (HCC)   COPD (chronic obstructive pulmonary disease) (HCC)   Essential hypertension   Tobacco use   History of recurrent UTI (urinary tract infection)   GERD (gastroesophageal reflux disease)   Aneurysm of descending thoracic aorta (HCC)  Closed fracture of right hip (HCC)   Fall   Pre-op chest exam   S/P right hip fracture   Esophageal dysphagia   Acute post op blood loss anemia   Estimated body mass index is 30.57 kg/m as calculated from the following:   Height as of this encounter: 5' (1.524 m).   Weight as of this encounter: 71 kg. Advance diet Up with therapy  Acute post op blood loss anemia - Hgb 9.0.  Stable.  Continue with iron supplement Pain improving Care management to assist with discharge.  Patient will need follow-up with [facility] orthopedics in 2 weeks Lovenox 40 mg subcu daily x14 days at discharge TED hose bilateral lower extremities x6 weeks  DVT Prophylaxis - Lovenox, TED hose and SCDs Weight-Bearing as tolerated to right leg   T. Cranston Neighbor, PA-C [facility] 05/11/2019, 11:59 AM

## 2019-05-18 ENCOUNTER — Other Ambulatory Visit: Payer: Self-pay

## 2019-05-18 ENCOUNTER — Inpatient Hospital Stay
Admission: EM | Admit: 2019-05-18 | Discharge: 2019-05-23 | DRG: 690 | Disposition: A | Discharge status: Skilled Nursing Facility | Payer: Medicare PPO | Source: Skilled Nursing Facility | Attending: Internal Medicine | Admitting: Internal Medicine

## 2019-05-18 ENCOUNTER — Emergency Department: Payer: Medicare PPO

## 2019-05-18 DIAGNOSIS — I712 Thoracic aortic aneurysm, without rupture: Secondary | ICD-10-CM | POA: Diagnosis present

## 2019-05-18 DIAGNOSIS — Z20822 Contact with and (suspected) exposure to covid-19: Secondary | ICD-10-CM | POA: Diagnosis present

## 2019-05-18 DIAGNOSIS — W19XXXD Unspecified fall, subsequent encounter: Secondary | ICD-10-CM | POA: Diagnosis present

## 2019-05-18 DIAGNOSIS — I4510 Unspecified right bundle-branch block: Secondary | ICD-10-CM | POA: Diagnosis present

## 2019-05-18 DIAGNOSIS — I10 Essential (primary) hypertension: Secondary | ICD-10-CM | POA: Diagnosis present

## 2019-05-18 DIAGNOSIS — I9589 Other hypotension: Secondary | ICD-10-CM | POA: Diagnosis not present

## 2019-05-18 DIAGNOSIS — J449 Chronic obstructive pulmonary disease, unspecified: Secondary | ICD-10-CM | POA: Diagnosis not present

## 2019-05-18 DIAGNOSIS — Z720 Tobacco use: Secondary | ICD-10-CM

## 2019-05-18 DIAGNOSIS — E861 Hypovolemia: Secondary | ICD-10-CM

## 2019-05-18 DIAGNOSIS — S7221XD Displaced subtrochanteric fracture of right femur, subsequent encounter for closed fracture with routine healing: Secondary | ICD-10-CM

## 2019-05-18 DIAGNOSIS — Z87440 Personal history of urinary (tract) infections: Secondary | ICD-10-CM

## 2019-05-18 DIAGNOSIS — N3000 Acute cystitis without hematuria: Secondary | ICD-10-CM | POA: Diagnosis not present

## 2019-05-18 DIAGNOSIS — R531 Weakness: Secondary | ICD-10-CM | POA: Diagnosis not present

## 2019-05-18 DIAGNOSIS — Z79899 Other long term (current) drug therapy: Secondary | ICD-10-CM

## 2019-05-18 DIAGNOSIS — B9620 Unspecified Escherichia coli [E. coli] as the cause of diseases classified elsewhere: Secondary | ICD-10-CM | POA: Diagnosis present

## 2019-05-18 DIAGNOSIS — E860 Dehydration: Secondary | ICD-10-CM | POA: Diagnosis present

## 2019-05-18 DIAGNOSIS — Z7982 Long term (current) use of aspirin: Secondary | ICD-10-CM

## 2019-05-18 DIAGNOSIS — F17200 Nicotine dependence, unspecified, uncomplicated: Secondary | ICD-10-CM | POA: Diagnosis present

## 2019-05-18 DIAGNOSIS — R4182 Altered mental status, unspecified: Secondary | ICD-10-CM

## 2019-05-18 DIAGNOSIS — K219 Gastro-esophageal reflux disease without esophagitis: Secondary | ICD-10-CM | POA: Diagnosis present

## 2019-05-18 DIAGNOSIS — N390 Urinary tract infection, site not specified: Secondary | ICD-10-CM | POA: Diagnosis present

## 2019-05-18 DIAGNOSIS — I959 Hypotension, unspecified: Secondary | ICD-10-CM | POA: Diagnosis present

## 2019-05-18 DIAGNOSIS — J9811 Atelectasis: Secondary | ICD-10-CM | POA: Diagnosis present

## 2019-05-18 DIAGNOSIS — Z880 Allergy status to penicillin: Secondary | ICD-10-CM

## 2019-05-18 LAB — CBC WITH DIFFERENTIAL/PLATELET
Abs Immature Granulocytes: 0.52 10*3/uL — ABNORMAL HIGH (ref 0.00–0.07)
Basophils Absolute: 0.1 10*3/uL (ref 0.0–0.1)
Basophils Relative: 1 %
Eosinophils Absolute: 0.3 10*3/uL (ref 0.0–0.5)
Eosinophils Relative: 3 %
HCT: 26.2 % — ABNORMAL LOW (ref 36.0–46.0)
Hemoglobin: 8.3 g/dL — ABNORMAL LOW (ref 12.0–15.0)
Immature Granulocytes: 4 %
Lymphocytes Relative: 10 %
Lymphs Abs: 1.2 10*3/uL (ref 0.7–4.0)
MCH: 30.2 pg (ref 26.0–34.0)
MCHC: 31.7 g/dL (ref 30.0–36.0)
MCV: 95.3 fL (ref 80.0–100.0)
Monocytes Absolute: 1.2 10*3/uL — ABNORMAL HIGH (ref 0.1–1.0)
Monocytes Relative: 11 %
Neutro Abs: 8.4 10*3/uL — ABNORMAL HIGH (ref 1.7–7.7)
Neutrophils Relative %: 71 %
Platelets: 483 10*3/uL — ABNORMAL HIGH (ref 150–400)
RBC: 2.75 MIL/uL — ABNORMAL LOW (ref 3.87–5.11)
RDW: 16.6 % — ABNORMAL HIGH (ref 11.5–15.5)
WBC: 11.7 10*3/uL — ABNORMAL HIGH (ref 4.0–10.5)
nRBC: 0 % (ref 0.0–0.2)

## 2019-05-18 LAB — URINALYSIS, COMPLETE (UACMP) WITH MICROSCOPIC
Bacteria, UA: NONE SEEN
Bilirubin Urine: NEGATIVE
Glucose, UA: NEGATIVE mg/dL
Ketones, ur: 20 mg/dL — AB
Nitrite: POSITIVE — AB
Protein, ur: 100 mg/dL — AB
RBC / HPF: >50 RBC/hpf — ABNORMAL HIGH (ref 0–5)
Specific Gravity, Urine: 1.016 (ref 1.005–1.030)
Squamous Epithelial / HPF: NONE SEEN (ref 0–5)
WBC, UA: >50 WBC/hpf — ABNORMAL HIGH (ref 0–5)
pH: 5 (ref 5.0–8.0)

## 2019-05-18 LAB — COMPREHENSIVE METABOLIC PANEL
ALT: 14 U/L (ref 0–44)
AST: 29 U/L (ref 15–41)
Albumin: 2 g/dL — ABNORMAL LOW (ref 3.5–5.0)
Alkaline Phosphatase: 548 U/L — ABNORMAL HIGH (ref 38–126)
Anion gap: 10 (ref 5–15)
BUN: 19 mg/dL (ref 8–23)
CO2: 25 mmol/L (ref 22–32)
Calcium: 9.7 mg/dL (ref 8.9–10.3)
Chloride: 102 mmol/L (ref 98–111)
Creatinine, Ser: 0.95 mg/dL (ref 0.44–1.00)
GFR calc Af Amer: >60 mL/min (ref >60)
GFR calc non Af Amer: 58 mL/min — ABNORMAL LOW (ref >60)
Glucose, Bld: 85 mg/dL (ref 70–99)
Potassium: 3.5 mmol/L (ref 3.5–5.1)
Sodium: 137 mmol/L (ref 135–145)
Total Bilirubin: 1.3 mg/dL — ABNORMAL HIGH (ref 0.3–1.2)
Total Protein: 5.3 g/dL — ABNORMAL LOW (ref 6.5–8.1)

## 2019-05-18 LAB — SARS CORONAVIRUS 2 (TAT 6-24 HRS): SARS Coronavirus 2: NEGATIVE

## 2019-05-18 LAB — LACTIC ACID, PLASMA
Lactic Acid, Venous: 1.3 mmol/L (ref 0.5–1.9)
Lactic Acid, Venous: 1.5 mmol/L (ref 0.5–1.9)

## 2019-05-18 LAB — TROPONIN I (HIGH SENSITIVITY)
Troponin I (High Sensitivity): 15 ng/L (ref <18)
Troponin I (High Sensitivity): 13 ng/L (ref <18)

## 2019-05-18 IMAGING — DX DG CHEST 1V PORT
1 series · 1 of 1 positions shown · non-contrast
Comparison: [DATE].

CLINICAL DATA: Sepsis.

EXAM:
PORTABLE CHEST 1 VIEW

[chest ap]
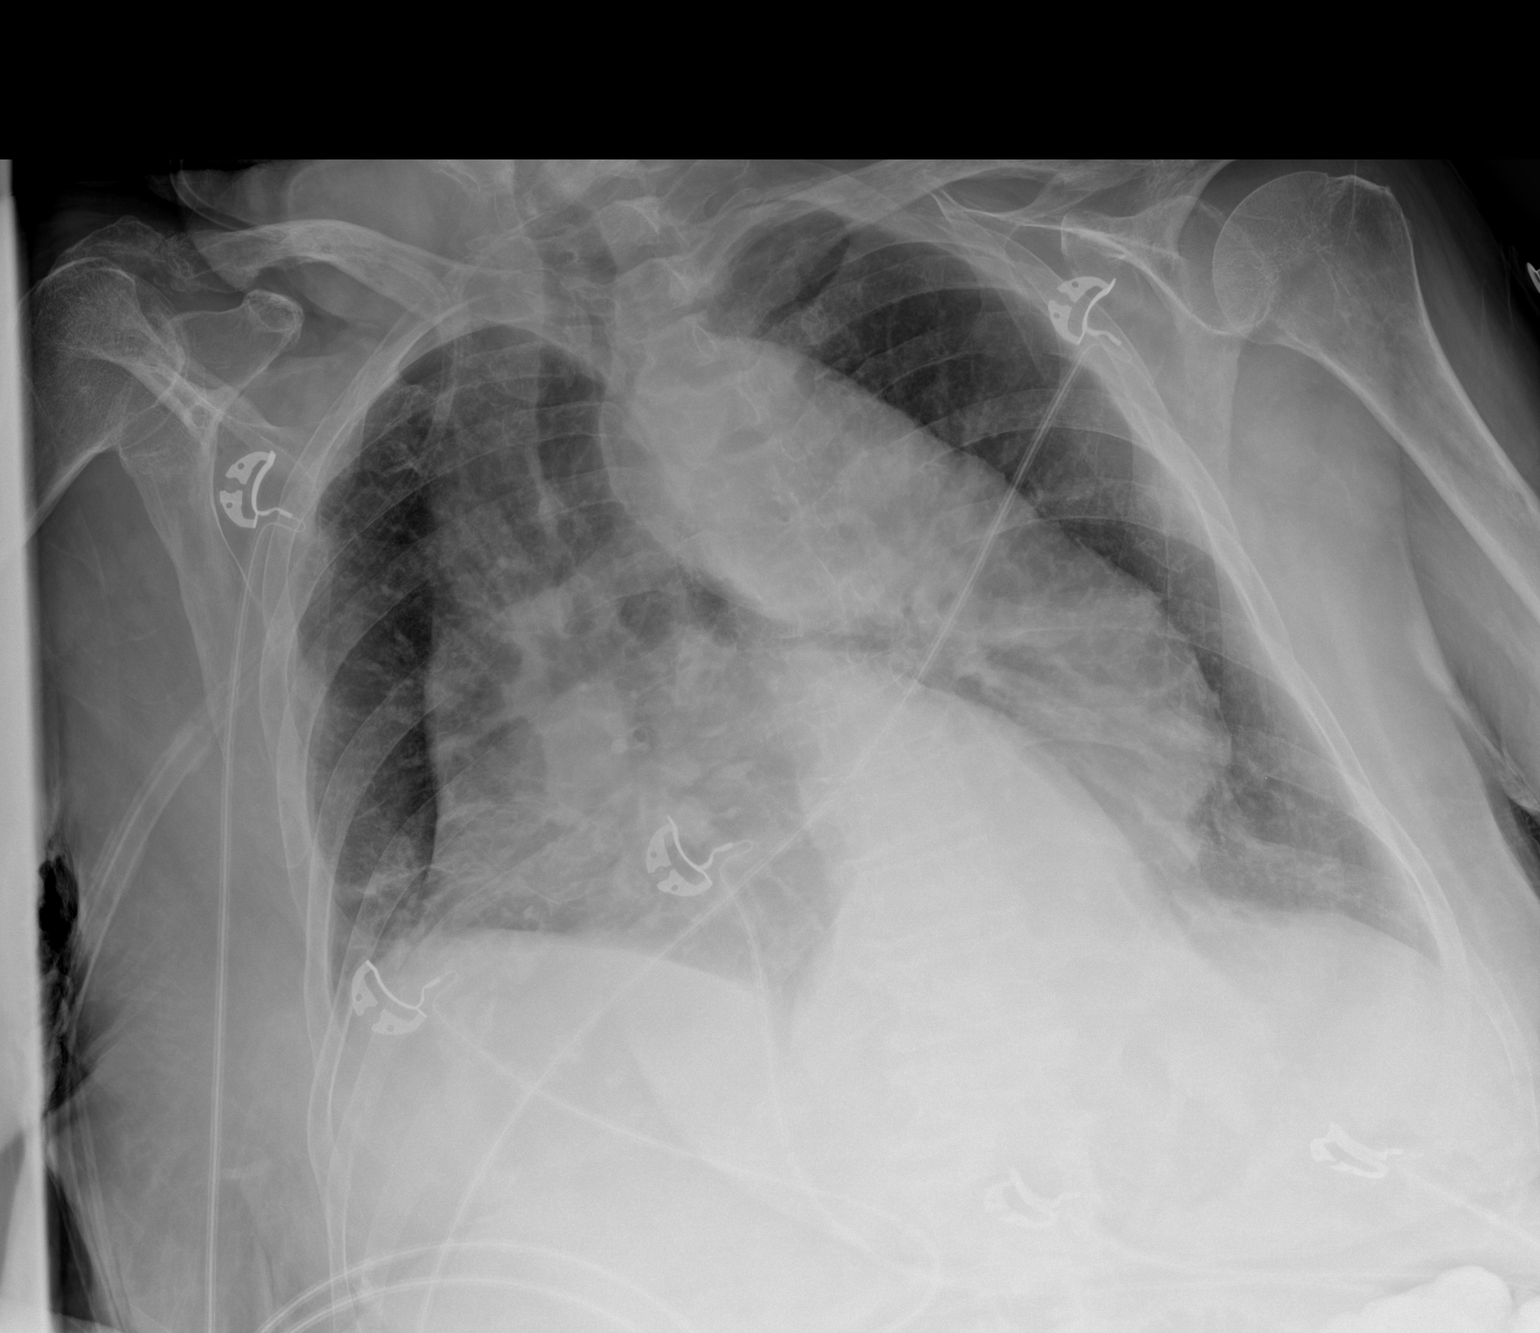

[1 of 1 positions shown; findings below may reference images not displayed]

FINDINGS: Stable cardiomegaly. No pneumothorax or pleural effusion is noted.
Left lung is clear. Minimal right basilar subsegmental atelectasis
is noted. Bony thorax is unremarkable.
IMPRESSION: Minimal right basilar subsegmental atelectasis.

## 2019-05-18 MED ORDER — ASPIRIN EC 81 MG PO TBEC
81.0000 mg | DELAYED_RELEASE_TABLET | Freq: Every day | ORAL | Status: DC
  Administered 2019-05-18 – 2019-05-22 (×5): 81 mg via ORAL
  Filled 2019-05-18 (×5): qty 1

## 2019-05-18 MED ORDER — ENSURE ENLIVE PO LIQD
237.0000 mL | Freq: Two times a day (BID) | ORAL | Status: DC
  Administered 2019-05-18: 237 mL via ORAL

## 2019-05-18 MED ORDER — ROSUVASTATIN CALCIUM 10 MG PO TABS
10.0000 mg | ORAL_TABLET | Freq: Every day | ORAL | Status: DC
  Administered 2019-05-18 – 2019-05-22 (×5): 10 mg via ORAL
  Filled 2019-05-18 (×5): qty 1

## 2019-05-18 MED ORDER — LORATADINE 10 MG PO TABS
10.0000 mg | ORAL_TABLET | Freq: Every day | ORAL | Status: DC
  Administered 2019-05-19 – 2019-05-23 (×4): 10 mg via ORAL
  Filled 2019-05-18 (×4): qty 1

## 2019-05-18 MED ORDER — BISACODYL 10 MG RE SUPP
10.0000 mg | Freq: Every day | RECTAL | Status: DC | PRN
  Filled 2019-05-18: qty 1

## 2019-05-18 MED ORDER — SODIUM CHLORIDE 0.9 % IV SOLN
1.0000 g | INTRAVENOUS | Status: AC
  Administered 2019-05-18 – 2019-05-22 (×4): 1 g via INTRAVENOUS
  Filled 2019-05-18 (×2): qty 1
  Filled 2019-05-18: qty 10
  Filled 2019-05-18: qty 1
  Filled 2019-05-18: qty 10

## 2019-05-18 MED ORDER — PANTOPRAZOLE SODIUM 40 MG PO TBEC
40.0000 mg | DELAYED_RELEASE_TABLET | Freq: Every day | ORAL | Status: DC
  Administered 2019-05-19 – 2019-05-23 (×5): 40 mg via ORAL
  Filled 2019-05-18 (×5): qty 1

## 2019-05-18 MED ORDER — ALBUTEROL SULFATE (2.5 MG/3ML) 0.083% IN NEBU: 3.0000 mL | INHALATION_SOLUTION | Freq: Four times a day (QID) | RESPIRATORY_TRACT | Status: DC | PRN

## 2019-05-18 MED ORDER — FLUTICASONE FUROATE-VILANTEROL 200-25 MCG/INH IN AEPB
1.0000 | INHALATION_SPRAY | Freq: Every day | RESPIRATORY_TRACT | Status: DC
  Administered 2019-05-19 – 2019-05-23 (×5): 1 via RESPIRATORY_TRACT
  Filled 2019-05-18 (×2): qty 28

## 2019-05-18 MED ORDER — CALCIUM CARBONATE-VITAMIN D 500-200 MG-UNIT PO TABS
1.0000 | ORAL_TABLET | Freq: Every day | ORAL | Status: DC
  Administered 2019-05-19 – 2019-05-23 (×5): 1 via ORAL
  Filled 2019-05-18 (×5): qty 1

## 2019-05-18 MED ORDER — LACTATED RINGERS IV BOLUS
1000.0000 mL | Freq: Once | INTRAVENOUS | Status: AC
  Administered 2019-05-18: 1000 mL via INTRAVENOUS

## 2019-05-18 MED ORDER — TIOTROPIUM BROMIDE MONOHYDRATE 18 MCG IN CAPS
1.0000 | ORAL_CAPSULE | Freq: Every day | RESPIRATORY_TRACT | Status: DC
  Administered 2019-05-19 – 2019-05-23 (×5): 18 ug via RESPIRATORY_TRACT
  Filled 2019-05-18 (×2): qty 5

## 2019-05-18 MED ORDER — CALCIUM ACETATE (PHOS BINDER) 667 MG PO CAPS
667.0000 mg | ORAL_CAPSULE | Freq: Two times a day (BID) | ORAL | Status: DC
  Administered 2019-05-18 – 2019-05-23 (×9): 667 mg via ORAL
  Filled 2019-05-18 (×12): qty 1

## 2019-05-18 MED ORDER — OXYBUTYNIN CHLORIDE 5 MG PO TABS
5.0000 mg | ORAL_TABLET | Freq: Three times a day (TID) | ORAL | Status: DC
  Administered 2019-05-18 – 2019-05-23 (×15): 5 mg via ORAL
  Filled 2019-05-18 (×18): qty 1

## 2019-05-18 MED ORDER — ENOXAPARIN SODIUM 40 MG/0.4ML ~~LOC~~ SOLN
40.0000 mg | SUBCUTANEOUS | Status: DC
  Administered 2019-05-18 – 2019-05-22 (×5): 40 mg via SUBCUTANEOUS
  Filled 2019-05-18 (×5): qty 0.4

## 2019-05-18 MED ORDER — NICOTINE 14 MG/24HR TD PT24
14.0000 mg | MEDICATED_PATCH | Freq: Every day | TRANSDERMAL | Status: DC
  Filled 2019-05-18 (×2): qty 1

## 2019-05-18 MED ORDER — PREGABALIN 50 MG PO CAPS
100.0000 mg | ORAL_CAPSULE | Freq: Three times a day (TID) | ORAL | Status: DC
  Administered 2019-05-18 – 2019-05-23 (×15): 100 mg via ORAL
  Filled 2019-05-18 (×15): qty 2

## 2019-05-18 MED ORDER — DOCUSATE SODIUM 100 MG PO CAPS
100.0000 mg | ORAL_CAPSULE | Freq: Two times a day (BID) | ORAL | Status: DC
  Administered 2019-05-18 – 2019-05-22 (×9): 100 mg via ORAL
  Filled 2019-05-18 (×9): qty 1

## 2019-05-18 MED ORDER — FE FUMARATE-B12-VIT C-FA-IFC PO CAPS
1.0000 | ORAL_CAPSULE | Freq: Two times a day (BID) | ORAL | Status: DC
  Administered 2019-05-18 – 2019-05-23 (×10): 1 via ORAL
  Filled 2019-05-18 (×13): qty 1

## 2019-05-18 MED ORDER — VITAMIN D 25 MCG (1000 UNIT) PO TABS
1000.0000 [IU] | ORAL_TABLET | Freq: Every day | ORAL | Status: DC
  Administered 2019-05-19 – 2019-05-23 (×5): 1000 [IU] via ORAL
  Filled 2019-05-18 (×5): qty 1

## 2019-05-18 MED ORDER — B COMPLEX-C PO TABS
1.0000 | ORAL_TABLET | Freq: Every day | ORAL | Status: DC
  Administered 2019-05-19 – 2019-05-23 (×5): 1 via ORAL
  Filled 2019-05-18 (×5): qty 1

## 2019-05-18 MED ORDER — ALUM & MAG HYDROXIDE-SIMETH 200-200-20 MG/5ML PO SUSP: 30.0000 mL | ORAL | Status: DC | PRN

## 2019-05-18 MED ORDER — ADULT MULTIVITAMIN W/MINERALS CH
1.0000 | ORAL_TABLET | Freq: Every day | ORAL | Status: DC
  Administered 2019-05-19 – 2019-05-23 (×5): 1 via ORAL
  Filled 2019-05-18 (×5): qty 1

## 2019-05-18 NOTE — Progress Notes (Signed)
CODE SEPSIS - PHARMACY COMMUNICATION  **Broad Spectrum Antibiotics should be administered within 1 hour of Sepsis diagnosis**  Time Code Sepsis Called/Page Received: 1321  Antibiotics Ordered: Rocephin  Time of 1st antibiotic administration: 1338  Additional action taken by pharmacy: none  If necessary, Name of Provider/Nurse Contacted: n/a    Bettey Costa ,PharmD Clinical Pharmacist  05/18/2019  1:45 PM

## 2019-05-18 NOTE — ED Provider Notes (Signed)
[facility] Emergency Department Provider Note   ____________________________________________   First MD Initiated Contact with Patient 05/18/19 1139     (approximate)  I have reviewed the triage vital signs and the nursing notes.   HISTORY  Chief Complaint Weakness    HPI Holly Shelton is a 79 y.o. female with possible history of hypertension, COPD, and recurrent UTI who presents to the ED for altered mental status and weakness.  Per EMS, patient has been noted by staff at rehab facility to be increasingly weak over the past 24 hours.  She is typically alert and oriented, ambulatory at baseline, however she has been too weak to ambulate recently and less responsive in general.  Patient reports that she has been feeling generally weak and nauseous with some lower abdominal discomfort.  She also reports that she had some burning when she urinated at the end of last week, however this seems to have improved this week.  She denies any fevers, chills, cough, chest pain, or shortness of breath.        Past Medical History:  Diagnosis Date  . Hypertension     Patient Active Problem List   Diagnosis Date Noted  . UTI (urinary tract infection) 05/18/2019  . Pre-op chest exam   . S/P right hip fracture   . Esophageal dysphagia   . Fall   . Closed displaced subtrochanteric fracture of right femur (HCC) 05/06/2019  . COPD (chronic obstructive pulmonary disease) (HCC) 05/06/2019  . Essential hypertension 05/06/2019  . Tobacco use 05/06/2019  . History of recurrent UTI (urinary tract infection) 05/06/2019  . GERD (gastroesophageal reflux disease) 05/06/2019  . Aneurysm of descending thoracic aorta (HCC) 05/06/2019  . Closed fracture of right hip (HCC) 05/06/2019    Past Surgical History:  Procedure Laterality Date  . INTRAMEDULLARY (IM) NAIL INTERTROCHANTERIC Right 05/07/2019   Procedure: INTRAMEDULLARY (IM) NAIL INTERTROCHANTRIC;  Surgeon: Kennedy Bucker, MD;  Location: [facility] ORS;  Service: Orthopedics;  Laterality: Right;    Prior to Admission medications   Medication Sig Start Date End Date Taking? Authorizing Provider  aspirin buffered (BUFFERIN) 325 MG TABS tablet Take 325 mg by mouth 2 (two) times daily.   Yes [provider]  B Complex-C (B-COMPLEX WITH VITAMIN C) tablet Take 1 tablet by mouth daily.   Yes [provider]  Calcium 600-200 MG-UNIT tablet Take 1 tablet by mouth daily.   Yes [provider]  calcium acetate, Phos Binder, (PHOSLYRA) 667 MG/5ML SOLN Take 667 mg by mouth in the morning and at bedtime.   Yes [provider]  cetirizine (ZYRTEC) 10 MG tablet Take 10 mg by mouth daily.   Yes [provider]  cholecalciferol (VITAMIN D3) 25 MCG (1000 UNIT) tablet Take 1,000 Units by mouth daily.   Yes [provider]  docusate sodium (COLACE) 100 MG capsule Take 1 capsule (100 mg total) by mouth 2 (two) times daily. 05/11/19  Yes Arnetha Courser, MD  enoxaparin (LOVENOX) 40 MG/0.4ML injection Inject 0.4 mLs (40 mg total) into the skin daily for 14 days. 05/11/19 05/25/19 Yes Arnetha Courser, MD  ferrous fumarate-b12-vitamic C-folic acid (TRINSICON / FOLTRIN) capsule Take 1 capsule by mouth 2 (two) times daily. 05/11/19  Yes Arnetha Courser, MD  fluconazole (DIFLUCAN) 200 MG tablet Take 200 mg by mouth daily. 05/12/19 05/22/19 Yes [provider]  nitrofurantoin (MACRODANTIN) 50 MG capsule Take 50 mg by mouth at bedtime.  02/13/19  Yes [provider]  omeprazole (  PRILOSEC) 40 MG capsule Take 40 mg by mouth daily. 04/12/19  Yes [provider]  oxybutynin (DITROPAN) 5 MG tablet Take 5 mg by mouth 3 (three) times daily. 04/10/19  Yes [provider]  pregabalin (LYRICA) 100 MG capsule Take 100 mg by mouth 3 (three) times daily. 04/25/19  Yes [provider]  propranolol (INDERAL) 20 MG tablet Take 1 tablet (20 mg total) by mouth 2 (two) times daily.  05/11/19  Yes Arnetha Courser, MD  rosuvastatin (CRESTOR) 10 MG tablet Take 10 mg by mouth at bedtime. 04/12/19  Yes [provider]  SPIRIVA HANDIHALER 18 MCG inhalation capsule Place 1 capsule into inhaler and inhale daily. 04/25/19  Yes [provider]  Monte Fantasia INHUB 500-50 MCG/DOSE AEPB Inhale 1 puff into the lungs 2 (two) times daily. 05/03/19  Yes [provider]  albuterol (VENTOLIN HFA) 108 (90 Base) MCG/ACT inhaler Inhale 2 puffs into the lungs every 6 (six) hours as needed.  04/10/19   [provider]  alum & mag hydroxide-simeth (MAALOX/MYLANTA) 200-200-20 MG/5ML suspension Take 30 mLs by mouth every 4 (four) hours as needed for indigestion. 05/11/19   Arnetha Courser, MD  aspirin EC 81 MG tablet Take 81 mg by mouth at bedtime.    [provider]  bisacodyl (DULCOLAX) 10 MG suppository Place 1 suppository (10 mg total) rectally daily as needed for moderate constipation. 05/11/19   Arnetha Courser, MD  feeding supplement, ENSURE ENLIVE, (ENSURE ENLIVE) LIQD Take 237 mLs by mouth 2 (two) times daily between meals. 05/11/19   Arnetha Courser, MD  HYDROcodone-acetaminophen (NORCO/VICODIN) 5-325 MG tablet Take 1-2 tablets by mouth every 6 (six) hours as needed for moderate pain. 05/11/19   Arnetha Courser, MD  nicotine (NICODERM CQ - DOSED IN MG/24 HOURS) 14 mg/24hr patch Place 1 patch (14 mg total) onto the skin daily. 05/11/19   Arnetha Courser, MD  traMADol (ULTRAM) 50 MG tablet Take 1 tablet (50 mg total) by mouth every 12 (twelve) hours as needed for moderate pain. 05/11/19   Arnetha Courser, MD    Allergies Penicillins  History reviewed. No pertinent family history.  Social History Social History   Tobacco Use  . Smoking status: Current Every Day Smoker  . Smokeless tobacco: Never Used  Substance Use Topics  . Alcohol use: Never  . Drug use: Never    Review of Systems  Constitutional: No fever/chills.  Positive for generalized weakness. Eyes: No visual  changes. ENT: No sore throat. Cardiovascular: Denies chest pain. Respiratory: Denies shortness of breath. Gastrointestinal: Positive for abdominal pain.  Positive for nausea, no vomiting.  No diarrhea.  No constipation. Genitourinary: Positive for dysuria. Musculoskeletal: Negative for back pain. Skin: Negative for rash. Neurological: Negative for headaches, focal weakness or numbness.  ____________________________________________   PHYSICAL EXAM:  VITAL SIGNS: ED Triage Vitals  Enc Vitals Group     BP      Pulse      Resp      Temp      Temp src      SpO2      Weight      Height      Head Circumference      Peak Flow      Pain Score      Pain Loc      Pain Edu?      Excl. in GC?     Constitutional: Somnolent but easily arousable, oriented x3. Eyes: Conjunctivae are normal. Head: Atraumatic. Nose: No congestion/rhinnorhea.  Mouth/Throat: Mucous membranes are moist. Neck: Normal ROM Cardiovascular: Normal rate, regular rhythm. Grossly normal heart sounds. Respiratory: Normal respiratory effort.  No retractions. Lungs CTAB. Gastrointestinal: Soft and mildly tender to palpation in the suprapubic area. No distention. Genitourinary: deferred Musculoskeletal: No lower extremity tenderness nor edema. Neurologic:  Normal speech and language. No gross focal neurologic deficits are appreciated. Skin:  Skin is warm, dry and intact. No rash noted. Psychiatric: Mood and affect are normal. Speech and behavior are normal.  ____________________________________________   LABS (all labs ordered are listed, but only abnormal results are displayed)  Labs Reviewed  CBC WITH DIFFERENTIAL/PLATELET - Abnormal; Notable for the following components:      Result Value   WBC 11.7 (*)    RBC 2.75 (*)    Hemoglobin 8.3 (*)    HCT 26.2 (*)    RDW 16.6 (*)    Platelets 483 (*)    Neutro Abs 8.4 (*)    Monocytes Absolute 1.2 (*)    Abs Immature Granulocytes 0.52 (*)    All other  components within normal limits  COMPREHENSIVE METABOLIC PANEL - Abnormal; Notable for the following components:   Total Protein 5.3 (*)    Albumin 2.0 (*)    Alkaline Phosphatase 548 (*)    Total Bilirubin 1.3 (*)    GFR calc non Af Amer 58 (*)    All other components within normal limits  URINALYSIS, COMPLETE (UACMP) WITH MICROSCOPIC - Abnormal; Notable for the following components:   Color, Urine YELLOW (*)    APPearance TURBID (*)    Hgb urine dipstick LARGE (*)    Ketones, ur 20 (*)    Protein, ur 100 (*)    Nitrite POSITIVE (*)    Leukocytes,Ua LARGE (*)    RBC / HPF >50 (*)    WBC, UA >50 (*)    All other components within normal limits  CULTURE, BLOOD (ROUTINE X 2)  CULTURE, BLOOD (ROUTINE X 2)  URINE CULTURE  SARS CORONAVIRUS 2 (TAT 6-24 HRS)  LACTIC ACID, PLASMA  LACTIC ACID, PLASMA  TROPONIN I (HIGH SENSITIVITY)  TROPONIN I (HIGH SENSITIVITY)   ____________________________________________  EKG  ED ECG REPORT I, Blake Divine, the attending physician, personally viewed and interpreted this ECG.   Date: 05/18/2019  EKG Time: 11:42  Rate: 87  Rhythm: normal sinus rhythm  Axis: Normal  Intervals:right bundle branch block  ST&T Change: Normal   PROCEDURES  Procedure(s) performed (including Critical Care):  .1-3 Lead EKG Interpretation Performed by: Blake Divine, MD Authorized by: Blake Divine, MD     ECG rate:  92   ECG rate assessment: normal     Rhythm: sinus rhythm     Ectopy: none     Conduction: normal       ____________________________________________   INITIAL IMPRESSION / ASSESSMENT AND PLAN / ED COURSE       79 year old female presents to the ED for increased generalized weakness since last night along with decreased alertness.  Patient arouses easily to voice and has no focal neurologic deficits on my exam, I doubt acute stroke.  Her presentation is more concerning for sepsis and initial blood pressure was noted to be low,  however this improved with IV fluid bolus.  Chest x-ray is negative for acute process and lab work is unremarkable, however UA is concerning for infection, fitting with her clinical symptoms.  She does have a history of recurrent UTI on Macrobid, and with grossly infected urine today as well  as decreased mental status, we will admit for further management.  Patient was given initial dose of Rocephin in case discussed with hospitalist for admission.      ____________________________________________   FINAL CLINICAL IMPRESSION(S) / ED DIAGNOSES  Final diagnoses:  Acute cystitis without hematuria  Altered mental status, unspecified altered mental status type     ED Discharge Orders    None       Note:  This document was prepared using Dragon voice recognition software and may include unintentional dictation errors.   Chesley Noon, MD 05/18/19 [phone]

## 2019-05-18 NOTE — H&P (Signed)
History and Physical    Holly Shelton:[id number] DOB: 11/06/1940 DOA: 05/18/2019  PCP: Patient, No Pcp Per  Patient coming from: [employer] Complaint: Change in mental status  HPI:   Holly Shelton is a 79 y.o. female with medical history significant for HTN, COPD, GERD, AAA thoracic descending aorta, recurrent UTIs on nitrofurantoin and tobacco use who presents to the emergency room  From [place] for change in mental status.  Per ER nursing note patient was febrile at 101.1.  Per EMS facility stated her urine looked cloudy yesterday but denied any change in mental status then.  Per EMS patient 90% on room air and was placed on 3 L with improvement to 96%. Husband at bedside. Pt oriented, reports no abdominal pain, shortness of breath, fever, chills,, speech change.  Pt denied to me any neurologic sx including weakness.   Patient was just recently discharged from the hospital on 05/11/2019 when she was discharged after ORIF after status post fall.  Also treated for UTI and AKI.  Covid pending  ED Course: Patient arrived with blood pressure 102/56, decreased to 99/56.  Was given IV fluids by ER.  Started on Rocephin for UTI.  Lactic acid 1.5.  Alk phos 548. cxr Minimal right basilar subsegmental atelectasis.  Review of Systems: All systems reviewed and otherwise negative.    Past Medical History:  Diagnosis Date  . Hypertension     Past Surgical History:  Procedure Laterality Date  . INTRAMEDULLARY (IM) NAIL INTERTROCHANTERIC Right 05/07/2019   Procedure: INTRAMEDULLARY (IM) NAIL INTERTROCHANTRIC;  Surgeon: Hessie Knows, MD;  Location: [facility] ORS;  Service: Orthopedics;  Laterality: Right;     reports that she has been smoking. She has never used smokeless tobacco. She reports that she does not drink alcohol or use drugs.  Allergies  Allergen Reactions  . Penicillins Rash    Did it involve swelling of the face/tongue/throat, SOB, or low BP? No Did it  involve sudden or severe rash/hives, skin peeling, or any reaction on the inside of your mouth or nose? Yes Did you need to seek medical attention at a hospital or doctor's office? No When did it last happen? If all above answers are "NO", may proceed with cephalosporin use.    History reviewed. No pertinent family history.   Prior to Admission medications   Medication Sig Start Date End Date Taking? Authorizing Provider  albuterol (VENTOLIN HFA) 108 (90 Base) MCG/ACT inhaler Inhale 2 puffs into the lungs every 6 (six) hours as needed.  04/10/19   [provider]  alum & mag hydroxide-simeth (MAALOX/MYLANTA) 200-200-20 MG/5ML suspension Take 30 mLs by mouth every 4 (four) hours as needed for indigestion. 05/11/19   Lorella Nimrod, MD  aspirin EC 81 MG tablet Take 81 mg by mouth at bedtime.    [provider]  B Complex-C (B-COMPLEX WITH VITAMIN C) tablet Take 1 tablet by mouth daily.    [provider]  bisacodyl (DULCOLAX) 10 MG suppository Place 1 suppository (10 mg total) rectally daily as needed for moderate constipation. 05/11/19   Lorella Nimrod, MD  Calcium 600-200 MG-UNIT tablet Take 1 tablet by mouth daily.    [provider]  calcium acetate, Phos Binder, (PHOSLYRA) 667 MG/5ML SOLN Take 667 mg by mouth in the morning and at bedtime.    [provider]  cholecalciferol (VITAMIN D3) 25 MCG (1000 UNIT) tablet Take 1,000 Units by mouth daily.    [provider]  docusate  sodium (COLACE) 100 MG capsule Take 1 capsule (100 mg total) by mouth 2 (two) times daily. 05/11/19   Lorella Nimrod, MD  enoxaparin (LOVENOX) 40 MG/0.4ML injection Inject 0.4 mLs (40 mg total) into the skin daily for 14 days. 05/11/19 05/25/19  Lorella Nimrod, MD  feeding supplement, ENSURE ENLIVE, (ENSURE ENLIVE) LIQD Take 237 mLs by mouth 2 (two) times daily between meals. 05/11/19   Lorella Nimrod, MD  ferrous [id number] C-folic acid (TRINSICON / FOLTRIN) capsule  Take 1 capsule by mouth 2 (two) times daily. 05/11/19   Lorella Nimrod, MD  HYDROcodone-acetaminophen (NORCO/VICODIN) 5-325 MG tablet Take 1-2 tablets by mouth every 6 (six) hours as needed for moderate pain. 05/11/19   Lorella Nimrod, MD  loratadine (CLARITIN) 10 MG tablet Take 10 mg by mouth daily.    [provider]  nicotine (NICODERM CQ - DOSED IN MG/24 HOURS) 14 mg/24hr patch Place 1 patch (14 mg total) onto the skin daily. 05/11/19   Lorella Nimrod, MD  nitrofurantoin (MACRODANTIN) 50 MG capsule Take 50 mg by mouth at bedtime.  02/13/19   [provider]  omeprazole (PRILOSEC) 40 MG capsule Take 40 mg by mouth daily. 04/12/19   [provider]  oxybutynin (DITROPAN) 5 MG tablet Take 5 mg by mouth 3 (three) times daily. 04/10/19   [provider]  pregabalin (LYRICA) 100 MG capsule Take 100 mg by mouth 3 (three) times daily. 04/25/19   [provider]  propranolol (INDERAL) 20 MG tablet Take 1 tablet (20 mg total) by mouth 2 (two) times daily. 05/11/19   Lorella Nimrod, MD  rosuvastatin (CRESTOR) 10 MG tablet Take 10 mg by mouth at bedtime. 04/12/19   [provider]  SPIRIVA HANDIHALER 18 MCG inhalation capsule Place 1 capsule into inhaler and inhale daily. 04/25/19   [provider]  traMADol (ULTRAM) 50 MG tablet Take 1 tablet (50 mg total) by mouth every 12 (twelve) hours as needed for moderate pain. 05/11/19   Lorella Nimrod, MD  WIXELA INHUB 500-50 MCG/DOSE AEPB Inhale 1 puff into the lungs 2 (two) times daily. 05/03/19   [provider]    Physical Exam: Vitals:   05/18/19 1230 05/18/19 1300 05/18/19 1315 05/18/19 1320  BP: (!) 93/54 (!) 102/56    Pulse: 86 82 81 76  Resp: 14 14 14 14   Temp:      TempSrc:      SpO2: 100% 100% 100% 100%  Weight:      Height:        Constitutional: NAD, calm, comfortable Vitals:   05/18/19 1230 05/18/19 1300 05/18/19 1315 05/18/19 1320  BP: (!) 93/54 (!) 102/56    Pulse: 86 82 81 76  Resp:  14 14 14 14   Temp:      TempSrc:      SpO2: 100% 100% 100% 100%  Weight:      Height:      Gen: looks tired, nad Eyes: PERRL, lids and conjunctivae normal ENMT: mask on Neck: normal, supple Respiratory: clear to auscultation bilaterally, no wheezing, no crackles. Normal respiratory effort. No accessory muscle use.  Cardiovascular: Regular rate and rhythm, no murmurs / rubs / gallops.  Abdomen: no tenderness, Bowel sounds positive. soft Musculoskeletal: no edema, onychomycosis Skin: Warm dry Neurologic: awake and alertx3. 5/5 stenght x4.  No facial droop Psychiatric: Normal judgment and insight.   Labs on Admission: I have personally reviewed following labs and imaging studies  CBC: Recent Labs  Lab 05/18/19 1200  WBC  11.7*  NEUTROABS 8.4*  HGB 8.3*  HCT 26.2*  MCV 95.3  PLT 010*   Basic Metabolic Panel: Recent Labs  Lab 05/18/19 1200  NA 137  K 3.5  CL 102  CO2 25  GLUCOSE 85  BUN 19  CREATININE 0.95  CALCIUM 9.7   GFR: Estimated Creatinine Clearance: 44.3 mL/min (by C-G formula based on SCr of 0.95 mg/dL). Liver Function Tests: Recent Labs  Lab 05/18/19 1200  AST 29  ALT 14  ALKPHOS 548*  BILITOT 1.3*  PROT 5.3*  ALBUMIN 2.0*   No results for input(s): LIPASE, AMYLASE in the last 168 hours. No results for input(s): AMMONIA in the last 168 hours. Coagulation Profile: No results for input(s): INR, PROTIME in the last 168 hours. Cardiac Enzymes: No results for input(s): CKTOTAL, CKMB, CKMBINDEX, TROPONINI in the last 168 hours. BNP (last 3 results) No results for input(s): PROBNP in the last 8760 hours. HbA1C: No results for input(s): HGBA1C in the last 72 hours. CBG: No results for input(s): GLUCAP in the last 168 hours. Lipid Profile: No results for input(s): CHOL, HDL, LDLCALC, TRIG, CHOLHDL, LDLDIRECT in the last 72 hours. Thyroid Function Tests: No results for input(s): TSH, T4TOTAL, FREET4, T3FREE, THYROIDAB in the last 72 hours. Anemia  Panel: No results for input(s): VITAMINB12, FOLATE, FERRITIN, TIBC, IRON, RETICCTPCT in the last 72 hours. Urine analysis:    Component Value Date/Time   COLORURINE YELLOW (A) 05/18/2019 1202   APPEARANCEUR TURBID (A) 05/18/2019 1202   APPEARANCEUR Cloudy 05/19/2013 1200   LABSPEC 1.016 05/18/2019 1202   LABSPEC 1.015 05/19/2013 1200   PHURINE 5.0 05/18/2019 1202   GLUCOSEU NEGATIVE 05/18/2019 1202   GLUCOSEU Negative 05/19/2013 1200   HGBUR LARGE (A) 05/18/2019 1202   BILIRUBINUR NEGATIVE 05/18/2019 1202   BILIRUBINUR Negative 05/19/2013 1200   KETONESUR 20 (A) 05/18/2019 1202   PROTEINUR 100 (A) 05/18/2019 1202   NITRITE POSITIVE (A) 05/18/2019 1202   LEUKOCYTESUR LARGE (A) 05/18/2019 1202   LEUKOCYTESUR 2+ 05/19/2013 1200    Radiological Exams on Admission: DG Chest Portable 1 View  Result Date: 05/18/2019 CLINICAL DATA:  Sepsis. EXAM: PORTABLE CHEST 1 VIEW COMPARISON:  May 06, 2019. FINDINGS: Stable cardiomegaly. No pneumothorax or pleural effusion is noted. Left lung is clear. Minimal right basilar subsegmental atelectasis is noted. Bony thorax is unremarkable. IMPRESSION: Minimal right basilar subsegmental atelectasis. Electronically Signed   By: Marijo Conception M.D.   On: 05/18/2019 12:28    EKG: Independently reviewed. SR , Rbbb, no ischemic st changes  Assessment/Plan Active Problems:   UTI (urinary tract infection)    1. UTI- pt is hypotensive, but I think is more from being on beta blk and dehydration. V.s. sepsis. No leukocytosis, no tachycardia Afebrile here Responded to fluid We will start her on Rocephin IV daily Check blood cultures and urine culture covid pending  2.Hypotension-sepsis v.s. dehyration/beta blk Responded to fluid. Previous admission had issues with low bp.   3.COPD (chronic obstructive pulmonary disease) (HCC)-not in exacerbation -Continue home bronchodilators, scheduled and as needed  4.Essential hypertension -hold home meds due to  hypotension  5.  Tobacco abuse-patient is trying to quit smoking per her husband. Encouraged to continue to try quitting   DVT prophylaxis: lovenox Code Status:full  Family Communication: family/husband at bedside Disposition Plan: back to [employer] called: none  Admission status: inpatient as patient requires 2 midnight stays   Nolberto Hanlon MD [facility] Pager [phone]   If 7PM-7AM, please contact  night-coverage [email or web address] Password [email or web address]  05/18/2019, 2:22 PM

## 2019-05-18 NOTE — ED Notes (Signed)
Upon arrival this RN was handed paperwork for pt from [employer]. Pt did not have dentures in or with her. Pt's husband went to rehab facility to retrieve dentures. Facility stating they were sent with EMS. Secretary notifying EMS to try to locate dentures.

## 2019-05-18 NOTE — ED Notes (Signed)
This RN spoke with EMS. Per EMS they received pt's paperwork and pt from [employer]. No dentures were given to EMS.

## 2019-05-18 NOTE — ED Triage Notes (Signed)
Pt to ED via [facility] from [facility]. Per EMS facility states pt started experiencing right sided weakness. Pt febrile with axillary temp 101.1. Pt with hx UTI. Per EMS facility states her urine looked cloudy yesterday but denies any AMS. Pt with recent right hip surgery. Per EMS pt 90% on RA, place don 3L [place] with improvement to 96%. Initial BP 103/68 and given of fluid in route.   Upon arrival pt favoring right side. Pt states she denies N/V/D. Pt stating pain in right hip and back.

## 2019-05-19 DIAGNOSIS — E861 Hypovolemia: Secondary | ICD-10-CM | POA: Diagnosis not present

## 2019-05-19 DIAGNOSIS — I10 Essential (primary) hypertension: Secondary | ICD-10-CM | POA: Diagnosis not present

## 2019-05-19 DIAGNOSIS — I9589 Other hypotension: Secondary | ICD-10-CM | POA: Diagnosis not present

## 2019-05-19 DIAGNOSIS — N3000 Acute cystitis without hematuria: Secondary | ICD-10-CM | POA: Diagnosis not present

## 2019-05-19 LAB — PROCALCITONIN: Procalcitonin: 0.73 ng/mL

## 2019-05-19 LAB — PROTIME-INR
INR: 1.2 (ref 0.8–1.2)
Prothrombin Time: 14.7 seconds (ref 11.4–15.2)

## 2019-05-19 LAB — COMPREHENSIVE METABOLIC PANEL
ALT: 14 U/L (ref 0–44)
AST: 33 U/L (ref 15–41)
Albumin: 1.9 g/dL — ABNORMAL LOW (ref 3.5–5.0)
Alkaline Phosphatase: 495 U/L — ABNORMAL HIGH (ref 38–126)
Anion gap: 12 (ref 5–15)
BUN: 16 mg/dL (ref 8–23)
CO2: 23 mmol/L (ref 22–32)
Calcium: 9.5 mg/dL (ref 8.9–10.3)
Chloride: 101 mmol/L (ref 98–111)
Creatinine, Ser: 0.82 mg/dL (ref 0.44–1.00)
GFR calc Af Amer: >60 mL/min (ref >60)
GFR calc non Af Amer: >60 mL/min (ref >60)
Glucose, Bld: 80 mg/dL (ref 70–99)
Potassium: 3.8 mmol/L (ref 3.5–5.1)
Sodium: 136 mmol/L (ref 135–145)
Total Bilirubin: 0.9 mg/dL (ref 0.3–1.2)
Total Protein: 5.2 g/dL — ABNORMAL LOW (ref 6.5–8.1)

## 2019-05-19 LAB — CORTISOL-AM, BLOOD: Cortisol - AM: 23.4 ug/dL — ABNORMAL HIGH (ref 6.7–22.6)

## 2019-05-19 MED ORDER — SODIUM CHLORIDE 0.9 % IV SOLN: INTRAVENOUS | Status: DC

## 2019-05-19 MED ORDER — SODIUM CHLORIDE 0.9 % IV BOLUS
250.0000 mL | Freq: Once | INTRAVENOUS | Status: AC
  Administered 2019-05-19: 250 mL via INTRAVENOUS

## 2019-05-19 MED ORDER — MIDODRINE HCL 5 MG PO TABS
2.5000 mg | ORAL_TABLET | Freq: Three times a day (TID) | ORAL | Status: DC
  Administered 2019-05-19 (×2): 2.5 mg via ORAL
  Filled 2019-05-19: qty 1

## 2019-05-19 MED ORDER — TRAMADOL HCL 50 MG PO TABS
50.0000 mg | ORAL_TABLET | Freq: Four times a day (QID) | ORAL | Status: DC | PRN
  Administered 2019-05-19 – 2019-05-20 (×2): 50 mg via ORAL
  Filled 2019-05-19 (×2): qty 1

## 2019-05-19 NOTE — Progress Notes (Signed)
MEWS score at 2, low BP, see VS. Discussed with on call provider Webb Silversmith NP, advised to wait since midodrine was given at 1830. Second notification, received order for bolus. Patient is slow to respond, responding appropriately. Will continue to monitor.

## 2019-05-19 NOTE — Care Management CC44 (Signed)
Condition Code 44 Documentation Completed  Patient Details  Name: Holly Shelton MRN: [id number] Date of Birth: March 09, 1940   Condition Code 44 given:  Yes Patient signature on Condition Code 44 notice:  Yes Documentation of 2 MD's agreement:  Yes Code 44 added to claim:  Yes    Barrie Dunker, RN 05/19/2019, 10:17 AM

## 2019-05-19 NOTE — Progress Notes (Signed)
Holly Shelton [facility]  PROGRESS NOTE    Holly Shelton  DDU:[id number] DOB: 08/09/1940 DOA: 05/18/2019 PCP: Patient, No Pcp Per    Brief Narrative:  Holly Shelton a 79 y.o.femalewith medical history significant forHTN, COPD, GERD, AAA thoracic descending aorta, recurrent UTIs on nitrofurantoin and tobacco use who presents to the emergency room  From [place] for change in mental status.  Found with UTI    Consultants:     Procedures:   Antimicrobials:   Rocephin started on 05/18/2019   Subjective: Reports feeling much better today.  No shortness of breath, abdominal pain, nausea vomiting  Objective: Vitals:   05/18/19 1925 05/18/19 2013 05/19/19 0006 05/19/19 0443  BP: (!) 106/57 (!) 98/27 (!) 98/56 (!) 89/60  Pulse: 83 86 96 91  Resp: 16 17 (!) 21 (!) 21  Temp: 97.9 F (36.6 C) 97.6 F (36.4 C) 98.1 F (36.7 C) (!) 97.5 F (36.4 C)  TempSrc: Oral Oral Oral Oral  SpO2: 100% 100% 100% 98%  Weight:      Height:       No intake or output data in the 24 hours ending 05/19/19 1312 Filed Weights   05/18/19 1144  Weight: 71 kg    Examination:  General exam: Appears calm and comfortable, NAD Respiratory system: Clear to auscultation. Respiratory effort normal. Cardiovascular system: S1 & S2 heard, RRR. No JVD, murmurs, rubs, gallops or clicks.  Gastrointestinal system: Abdomen is nondistended, soft and nontender. . Normal bowel sounds heard. Central nervous system: Alert and oriented x3.  Grossly intact Extremities: No edema Skin: Warm dry Psychiatry: Judgement and insight appear normal. Mood & affect appropriate.     Data Reviewed: I have personally reviewed following labs and imaging studies  CBC: Recent Labs  Lab 05/18/19 1200  WBC 11.7*  NEUTROABS 8.4*  HGB 8.3*  HCT 26.2*  MCV 95.3  PLT 237*   Basic Metabolic Panel: Recent Labs  Lab 05/18/19 1200  NA 137  K 3.5  CL 102  CO2 25  GLUCOSE 85  BUN 19  CREATININE 0.95  CALCIUM 9.7    GFR: Estimated Creatinine Clearance: 44.3 mL/min (by C-G formula based on SCr of 0.95 mg/dL). Liver Function Tests: Recent Labs  Lab 05/18/19 1200  AST 29  ALT 14  ALKPHOS 548*  BILITOT 1.3*  PROT 5.3*  ALBUMIN 2.0*   No results for input(s): LIPASE, AMYLASE in the last 168 hours. No results for input(s): AMMONIA in the last 168 hours. Coagulation Profile: Recent Labs  Lab 05/19/19 0455  INR 1.2   Cardiac Enzymes: No results for input(s): CKTOTAL, CKMB, CKMBINDEX, TROPONINI in the last 168 hours. BNP (last 3 results) No results for input(s): PROBNP in the last 8760 hours. HbA1C: No results for input(s): HGBA1C in the last 72 hours. CBG: No results for input(s): GLUCAP in the last 168 hours. Lipid Profile: No results for input(s): CHOL, HDL, LDLCALC, TRIG, CHOLHDL, LDLDIRECT in the last 72 hours. Thyroid Function Tests: No results for input(s): TSH, T4TOTAL, FREET4, T3FREE, THYROIDAB in the last 72 hours. Anemia Panel: No results for input(s): VITAMINB12, FOLATE, FERRITIN, TIBC, IRON, RETICCTPCT in the last 72 hours. Sepsis Labs: Recent Labs  Lab 05/18/19 1200 05/18/19 1337 05/19/19 0455  PROCALCITON  --   --  0.73  LATICACIDVEN 1.3 1.5  --     Recent Results (from the past 240 hour(s))  Respiratory Panel by RT PCR (Flu A&B, Covid) - Nasopharyngeal Swab     Status: None   Collection Time:  05/10/19 12:35 PM   Specimen: Nasopharyngeal Swab  Result Value Ref Range Status   SARS Coronavirus 2 by RT PCR NEGATIVE NEGATIVE Final    Comment: (NOTE) SARS-CoV-2 target nucleic acids are NOT DETECTED. The SARS-CoV-2 RNA is generally detectable in upper respiratoy specimens during the acute phase of infection. The lowest concentration of SARS-CoV-2 viral copies this assay can detect is 131 copies/mL. A negative result does not preclude SARS-Cov-2 infection and should not be used as the sole basis for treatment or other patient management decisions. A negative result  may occur with  improper specimen collection/handling, submission of specimen other than nasopharyngeal swab, presence of viral mutation(s) within the areas targeted by this assay, and inadequate number of viral copies (<131 copies/mL). A negative result must be combined with clinical observations, patient history, and epidemiological information. The expected result is Negative. Fact Sheet for Patients:  https://www.moore.com/ Fact Sheet for Healthcare Providers:  https://www.young.biz/ This test is not yet ap proved or cleared by the [place] FDA and  has been authorized for detection and/or diagnosis of SARS-CoV-2 by FDA under an Emergency Use Authorization (EUA). This EUA will remain  in effect (meaning this test can be used) for the duration of the COVID-19 declaration under Section 564(b)(1) of the Act, 21 U.S.C. section 360bbb-3(b)(1), unless the authorization is terminated or revoked sooner.    Influenza A by PCR NEGATIVE NEGATIVE Final   Influenza B by PCR NEGATIVE NEGATIVE Final    Comment: (NOTE) The Xpert Xpress SARS-CoV-2/FLU/RSV assay is intended as an aid in  the diagnosis of influenza from Nasopharyngeal swab specimens and  should not be used as a sole basis for treatment. Nasal washings and  aspirates are unacceptable for Xpert Xpress SARS-CoV-2/FLU/RSV  testing. Fact Sheet for Patients: https://www.moore.com/ Fact Sheet for Healthcare Providers: https://www.young.biz/ This test is not yet approved or cleared by the [place] FDA and  has been authorized for detection and/or diagnosis of SARS-CoV-2 by  FDA under an Emergency Use Authorization (EUA). This EUA will remain  in effect (meaning this test can be used) for the duration of the  Covid-19 declaration under Section 564(b)(1) of the Act, 21  U.S.C. section 360bbb-3(b)(1), unless the authorization is  terminated or  revoked. Performed at [facility], [address] [place], [place] [address]   Culture, blood (routine x 2)     Status: None (Preliminary result)   Collection Time: 05/18/19 12:00 PM   Specimen: BLOOD  Result Value Ref Range Status   Specimen Description BLOOD BLOOD RIGHT HAND  Final   Special Requests   Final    BOTTLES DRAWN AEROBIC AND ANAEROBIC Blood Culture adequate volume   Culture   Final    NO GROWTH < 24 HOURS Performed at [facility], [address] [place], [place] [address]    Report Status PENDING  Incomplete  Culture, blood (routine x 2)     Status: None (Preliminary result)   Collection Time: 05/18/19 12:01 PM   Specimen: BLOOD  Result Value Ref Range Status   Specimen Description BLOOD LEFT ANTECUBITAL  Final   Special Requests   Final    BOTTLES DRAWN AEROBIC AND ANAEROBIC Blood Culture adequate volume   Culture   Final    NO GROWTH < 24 HOURS Performed at [facility], [address] [place], [place] [address]    Report Status PENDING  Incomplete  Urine culture     Status: Abnormal (Preliminary result)   Collection Time:  05/18/19 12:02 PM   Specimen: Urine, Random  Result Value Ref Range Status   Specimen Description   Final    URINE, RANDOM Performed at [facility], [address], [place], [place] [address]    Special Requests   Final    NONE Performed at [facility], [address] [place], [place] [address]    Culture (A)  Final    >=100,000 COLONIES/mL Romie Minus NEGATIVE RODS SUSCEPTIBILITIES TO FOLLOW Performed at [facility], [address], [place], [place] [address]    Report Status PENDING  Incomplete  SARS CORONAVIRUS 2 (TAT 6-24 HRS) Nasopharyngeal Nasopharyngeal Swab     Status: None   Collection Time: 05/18/19  1:37 PM   Specimen: Nasopharyngeal Swab  Result Value Ref Range Status   SARS Coronavirus 2 NEGATIVE NEGATIVE Final    Comment: (NOTE) SARS-CoV-2 target nucleic acids  are NOT DETECTED. The SARS-CoV-2 RNA is generally detectable in upper and lower respiratory specimens during the acute phase of infection. Negative results do not preclude SARS-CoV-2 infection, do not rule out co-infections with other pathogens, and should not be used as the sole basis for treatment or other patient management decisions. Negative results must be combined with clinical observations, patient history, and epidemiological information. The expected result is Negative. Fact Sheet for Patients: HairSlick.no Fact Sheet for Healthcare Providers: quierodirigir.com This test is not yet approved or cleared by the [place] FDA and  has been authorized for detection and/or diagnosis of SARS-CoV-2 by FDA under an Emergency Use Authorization (EUA). This EUA will remain  in effect (meaning this test can be used) for the duration of the COVID-19 declaration under Section 56 4(b)(1) of the Act, 21 U.S.C. section 360bbb-3(b)(1), unless the authorization is terminated or revoked sooner. Performed at [facility], [address], [place], [place] [address]          Radiology Studies: DG Chest Portable 1 View  Result Date: 05/18/2019 CLINICAL DATA:  Sepsis. EXAM: PORTABLE CHEST 1 VIEW COMPARISON:  May 06, 2019. FINDINGS: Stable cardiomegaly. No pneumothorax or pleural effusion is noted. Left lung is clear. Minimal right basilar subsegmental atelectasis is noted. Bony thorax is unremarkable. IMPRESSION: Minimal right basilar subsegmental atelectasis. Electronically Signed   By: Lupita Raider M.D.   On: 05/18/2019 12:28        Scheduled Meds: . aspirin EC  81 mg Oral QHS  . B-complex with vitamin C  1 tablet Oral Daily  . calcium acetate  667 mg Oral BID  . calcium-vitamin D  1 tablet Oral Daily  . cholecalciferol  1,000 Units Oral Daily  . docusate sodium  100 mg Oral BID  . enoxaparin  40 mg Subcutaneous Q24H   . feeding supplement (ENSURE ENLIVE)  237 mL Oral BID BM  . ferrous fumarate-b12-vitamic C-folic acid  1 capsule Oral BID  . fluticasone furoate-vilanterol  1 puff Inhalation Daily  . loratadine  10 mg Oral Daily  . multivitamin with minerals  1 tablet Oral Daily  . nicotine  14 mg Transdermal Daily  . oxybutynin  5 mg Oral TID  . pantoprazole  40 mg Oral Daily  . pregabalin  100 mg Oral TID  . rosuvastatin  10 mg Oral QHS  . tiotropium  1 capsule Inhalation Daily   Continuous Infusions: . sodium chloride 75 mL/hr at 05/19/19 1236  . cefTRIAXone (ROCEPHIN)  IV 200 mL/hr at 05/19/19 1251    Assessment & Plan:  Active Problems:   UTI (urinary tract infection)   1. UTI- pt is hypotensive, but I think is more from being on beta blk and dehydration. V.s. sepsis. No leukocytosis, no tachycardia Urine culture with gram-negative bacteria, follow-up final and sensitivity Continue on Rocephin IV daily Continue blood cultures covid negative  2.Hypotension-sepsis v.s. dehyration/beta blk Still requiring IV fluids Previous admission had issues with low bp.  Plan; Continue IV fluids and will add midodrine  3.COPD (chronic obstructive pulmonary disease) (HCC)-not in exacerbation -Continue home bronchodilators, scheduled and as needed  4.Essential hypertension -hold home meds due to hypotension  5.  Tobacco abuse-patient is trying to quit smoking per her husband. Encouraged to continue to try quitting   DVT prophylaxis: lovenox Code Status:full  Family Communication:  Husband Disposition Plan:  Back home, does not want to go back to [employer].  Wants home health Barrier: Patient still quite hypotensive requiring IV fluids.  Requiring IV antibiotics as she is unable to take p.o. intake yet.  Possible DC in 1 to 2 days if blood pressure improves and able to take the p.o. medication     LOS: 1 day   Time spent: 45 minutes with more than 50% COC    Lynn Ito,  MD [facility] Pager [phone]  If 7PM-7AM, please contact night-coverage [email or web address] Password [email or web address] 05/19/2019, 1:12 PM

## 2019-05-19 NOTE — TOC Initial Note (Signed)
Transition of Care [facility]) - Initial/Assessment Note    Patient Details  Name: Holly Shelton MRN: [id number] Date of Birth: April 01, 1940  Transition of Care [facility]) CM/SW Contact:    Su Hilt, RN Phone Number: 05/19/2019, 10:26 AM  Clinical Narrative:                 Met with the patient to discuss DC plan and needs, she has been at [facility] after a Right Hip Surgery 1 week ago, she does not want to return to [facility] but wants to go home,  She has a RW and a WC at home and will need [facility] services.  Helene Kelp with [facility] was called and accepted the patient, no DME needs  Expected Discharge Plan: [place] Barriers to Discharge: Continued Medical Work up   Patient Goals and CMS Choice Patient states their goals for this hospitalization and ongoing recovery are:: go home      Expected Discharge Plan and Services Expected Discharge Plan: [place]   Discharge Planning Services: CM Consult   Living arrangements for the past 2 months: Single Family Home                 DME Arranged: N/A         HH Arranged: RN, PT, OT, Nurse's Aide HH Agency: [facility] [facility] [employer] Date [place]: 05/19/19 Time [place]: 1024 Representative spoke with at [place]: [place] Arrangements/Services Living arrangements for the past 2 months: [place] Lives with:: Spouse Patient language and need for interpreter reviewed:: Yes Do you feel safe going back to the place where you live?: Yes      Need for Family Participation in Patient Care: No (Comment) Care giver support system in place?: Yes (comment) Current home services: DME(RW and wheelchair) Criminal Activity/Legal Involvement Pertinent to Current Situation/Hospitalization: No - Comment as needed  Activities of Daily Living Home Assistive Devices/Equipment: Bedside commode/3-in-1 ADL Screening (condition at time of  admission) Patient's cognitive ability adequate to safely complete daily activities?: Yes Is the patient deaf or have difficulty hearing?: No Does the patient have difficulty seeing, even when wearing glasses/contacts?: No Does the patient have difficulty concentrating, remembering, or making decisions?: No Patient able to express need for assistance with ADLs?: Yes Does the patient have difficulty dressing or bathing?: Yes Independently performs ADLs?: No Does the patient have difficulty walking or climbing stairs?: Yes Weakness of Legs: Both Weakness of Arms/Hands: Both  Permission Sought/Granted   Permission granted to share information with : Yes, Verbal Permission Granted              Emotional Assessment Appearance:: Appears stated age Attitude/Demeanor/Rapport: Engaged Affect (typically observed): Appropriate Orientation: : Oriented to Situation, Oriented to  Time, Oriented to Place, Oriented to Self Alcohol / Substance Use: Not Applicable Psych Involvement: No (comment)  Admission diagnosis:  UTI (urinary tract infection) [N39.0] Acute cystitis without hematuria [N30.00] Altered mental status, unspecified altered mental status type [R41.82] Patient Active Problem List   Diagnosis Date Noted  . UTI (urinary tract infection) 05/18/2019  . Pre-op chest exam   . S/P right hip fracture   . Esophageal dysphagia   . Fall   . Closed displaced subtrochanteric fracture of right femur ([place]) 05/06/2019  . COPD (chronic obstructive pulmonary disease) ([place]) 05/06/2019  . Essential hypertension 05/06/2019  . Tobacco use 05/06/2019  . History of recurrent  UTI (urinary tract infection) 05/06/2019  . GERD (gastroesophageal reflux disease) 05/06/2019  . Aneurysm of descending thoracic aorta ([place]) 05/06/2019  . Closed fracture of right hip ([place]) 05/06/2019   PCP:  Patient, No Pcp Per Pharmacy:   [facility] [place], [place] [address] Phone: [phone] Fax: [phone]     Social Determinants of Health (SDOH) Interventions    Readmission Risk Interventions No flowsheet data found.

## 2019-05-20 DIAGNOSIS — I9589 Other hypotension: Secondary | ICD-10-CM | POA: Diagnosis not present

## 2019-05-20 DIAGNOSIS — R531 Weakness: Secondary | ICD-10-CM | POA: Diagnosis present

## 2019-05-20 DIAGNOSIS — I10 Essential (primary) hypertension: Secondary | ICD-10-CM | POA: Diagnosis present

## 2019-05-20 DIAGNOSIS — Z880 Allergy status to penicillin: Secondary | ICD-10-CM | POA: Diagnosis not present

## 2019-05-20 DIAGNOSIS — I4510 Unspecified right bundle-branch block: Secondary | ICD-10-CM | POA: Diagnosis present

## 2019-05-20 DIAGNOSIS — E860 Dehydration: Secondary | ICD-10-CM | POA: Diagnosis present

## 2019-05-20 DIAGNOSIS — F17200 Nicotine dependence, unspecified, uncomplicated: Secondary | ICD-10-CM | POA: Diagnosis present

## 2019-05-20 DIAGNOSIS — W19XXXD Unspecified fall, subsequent encounter: Secondary | ICD-10-CM | POA: Diagnosis present

## 2019-05-20 DIAGNOSIS — Z79899 Other long term (current) drug therapy: Secondary | ICD-10-CM | POA: Diagnosis not present

## 2019-05-20 DIAGNOSIS — Z7982 Long term (current) use of aspirin: Secondary | ICD-10-CM | POA: Diagnosis not present

## 2019-05-20 DIAGNOSIS — I712 Thoracic aortic aneurysm, without rupture: Secondary | ICD-10-CM | POA: Diagnosis present

## 2019-05-20 DIAGNOSIS — I959 Hypotension, unspecified: Secondary | ICD-10-CM | POA: Diagnosis present

## 2019-05-20 DIAGNOSIS — K219 Gastro-esophageal reflux disease without esophagitis: Secondary | ICD-10-CM | POA: Diagnosis present

## 2019-05-20 DIAGNOSIS — Z87440 Personal history of urinary (tract) infections: Secondary | ICD-10-CM | POA: Diagnosis not present

## 2019-05-20 DIAGNOSIS — B9620 Unspecified Escherichia coli [E. coli] as the cause of diseases classified elsewhere: Secondary | ICD-10-CM | POA: Diagnosis present

## 2019-05-20 DIAGNOSIS — Z20822 Contact with and (suspected) exposure to covid-19: Secondary | ICD-10-CM | POA: Diagnosis present

## 2019-05-20 DIAGNOSIS — N3000 Acute cystitis without hematuria: Secondary | ICD-10-CM | POA: Diagnosis present

## 2019-05-20 DIAGNOSIS — R4182 Altered mental status, unspecified: Secondary | ICD-10-CM | POA: Diagnosis not present

## 2019-05-20 DIAGNOSIS — Z720 Tobacco use: Secondary | ICD-10-CM | POA: Diagnosis not present

## 2019-05-20 DIAGNOSIS — J9811 Atelectasis: Secondary | ICD-10-CM | POA: Diagnosis present

## 2019-05-20 DIAGNOSIS — J449 Chronic obstructive pulmonary disease, unspecified: Secondary | ICD-10-CM | POA: Diagnosis present

## 2019-05-20 DIAGNOSIS — S7221XD Displaced subtrochanteric fracture of right femur, subsequent encounter for closed fracture with routine healing: Secondary | ICD-10-CM | POA: Diagnosis not present

## 2019-05-20 LAB — CBC
HCT: 24.9 % — ABNORMAL LOW (ref 36.0–46.0)
Hemoglobin: 7.9 g/dL — ABNORMAL LOW (ref 12.0–15.0)
MCH: 30.5 pg (ref 26.0–34.0)
MCHC: 31.7 g/dL (ref 30.0–36.0)
MCV: 96.1 fL (ref 80.0–100.0)
Platelets: 436 10*3/uL — ABNORMAL HIGH (ref 150–400)
RBC: 2.59 MIL/uL — ABNORMAL LOW (ref 3.87–5.11)
RDW: 16.3 % — ABNORMAL HIGH (ref 11.5–15.5)
WBC: 10.3 10*3/uL (ref 4.0–10.5)
nRBC: 0 % (ref 0.0–0.2)

## 2019-05-20 LAB — URINE CULTURE: Culture: >=100000 — AB

## 2019-05-20 MED ORDER — MIDODRINE HCL 5 MG PO TABS
5.0000 mg | ORAL_TABLET | Freq: Three times a day (TID) | ORAL | Status: DC
  Administered 2019-05-20 – 2019-05-21 (×4): 5 mg via ORAL
  Filled 2019-05-20 (×4): qty 1

## 2019-05-20 MED ORDER — TRAMADOL HCL 50 MG PO TABS
25.0000 mg | ORAL_TABLET | Freq: Four times a day (QID) | ORAL | Status: DC | PRN
  Administered 2019-05-21: 25 mg via ORAL
  Filled 2019-05-20: qty 1

## 2019-05-20 NOTE — Progress Notes (Signed)
[facility]  PROGRESS NOTE    ZASHA BELLEAU  ZOX:[id number] DOB: May 04, 1940 DOA: 05/18/2019 PCP: Patient, No Pcp Per    Brief Narrative:  Verdon Cummins a 79 y.o.femalewith medical history significant forHTN, COPD, GERD, AAA thoracic descending aorta, recurrent UTIs on nitrofurantoin and tobacco use who presents to the emergency room  From [place] for change in mental status.  Found with UTI    Consultants:   None  Procedures:   Antimicrobials:   Rocephin started on 05/18/2019   Subjective: This morning husband at bedside.  Patient has no complaints other than feeling dizzy affect.  Blood pressure is on the low side. Objective: Vitals:   05/20/19 0027 05/20/19 0417 05/20/19 0854 05/20/19 1146  BP: (!) 93/53 (!) 96/51 115/65 (!) 94/52  Pulse: 86 88 87 84  Resp: 16 16 20    Temp:   97.9 F (36.6 C)   TempSrc:   Oral   SpO2: 100% 100% 98% 100%  Weight:      Height:        Intake/Output Summary (Last 24 hours) at 05/20/2019 1226 Last data filed at 05/20/2019 0900 Gross per 24 hour  Intake 989.74 ml  Output 200 ml  Net 789.74 ml   Filed Weights   05/18/19 1144  Weight: 71 kg    Examination:  General exam: Appears calm and comfortable, sitting in bed, NAD Respiratory system: Clear to auscultation. Respiratory effort normal.  No wheeze or Rales Cardiovascular system: S1 & S2 heard, RRR. No JVD, murmurs, rubs, gallops or clicks.  Gastrointestinal system: Abdomen is nondistended, soft and nontender. . Normal bowel sounds heard. Central nervous system: Alert and oriented x3.  Grossly intact Extremities: No edema Skin: Warm dry Psychiatry: Judgement and insight appear normal. Mood & affect appropriate in current setting.     Data Reviewed: I have personally reviewed following labs and imaging studies  CBC: Recent Labs  Lab 05/18/19 1200 05/20/19 0508  WBC 11.7* 10.3  NEUTROABS 8.4*  --   HGB 8.3* 7.9*  HCT 26.2* 24.9*  MCV 95.3 96.1  PLT 483* 436*     Basic Metabolic Panel: Recent Labs  Lab 05/18/19 1200 05/19/19 0455  NA 137 136  K 3.5 3.8  CL 102 101  CO2 25 23  GLUCOSE 85 80  BUN 19 16  CREATININE 0.95 0.82  CALCIUM 9.7 9.5   GFR: Estimated Creatinine Clearance: 51.3 mL/min (by C-G formula based on SCr of 0.82 mg/dL). Liver Function Tests: Recent Labs  Lab 05/18/19 1200 05/19/19 0455  AST 29 33  ALT 14 14  ALKPHOS 548* 495*  BILITOT 1.3* 0.9  PROT 5.3* 5.2*  ALBUMIN 2.0* 1.9*   No results for input(s): LIPASE, AMYLASE in the last 168 hours. No results for input(s): AMMONIA in the last 168 hours. Coagulation Profile: Recent Labs  Lab 05/19/19 0455  INR 1.2   Cardiac Enzymes: No results for input(s): CKTOTAL, CKMB, CKMBINDEX, TROPONINI in the last 168 hours. BNP (last 3 results) No results for input(s): PROBNP in the last 8760 hours. HbA1C: No results for input(s): HGBA1C in the last 72 hours. CBG: No results for input(s): GLUCAP in the last 168 hours. Lipid Profile: No results for input(s): CHOL, HDL, LDLCALC, TRIG, CHOLHDL, LDLDIRECT in the last 72 hours. Thyroid Function Tests: No results for input(s): TSH, T4TOTAL, FREET4, T3FREE, THYROIDAB in the last 72 hours. Anemia Panel: No results for input(s): VITAMINB12, FOLATE, FERRITIN, TIBC, IRON, RETICCTPCT in the last 72 hours. Sepsis Labs: Recent Labs  Lab 05/18/19 1200 05/18/19 1337 05/19/19 0455  PROCALCITON  --   --  0.73  LATICACIDVEN 1.3 1.5  --     Recent Results (from the past 240 hour(s))  Respiratory Panel by RT PCR (Flu A&B, Covid) - Nasopharyngeal Swab     Status: None   Collection Time: 05/10/19 12:35 PM   Specimen: Nasopharyngeal Swab  Result Value Ref Range Status   SARS Coronavirus 2 by RT PCR NEGATIVE NEGATIVE Final    Comment: (NOTE) SARS-CoV-2 target nucleic acids are NOT DETECTED. The SARS-CoV-2 RNA is generally detectable in upper respiratoy specimens during the acute phase of infection. The lowest concentration of  SARS-CoV-2 viral copies this assay can detect is 131 copies/mL. A negative result does not preclude SARS-Cov-2 infection and should not be used as the sole basis for treatment or other patient management decisions. A negative result may occur with  improper specimen collection/handling, submission of specimen other than nasopharyngeal swab, presence of viral mutation(s) within the areas targeted by this assay, and inadequate number of viral copies (<131 copies/mL). A negative result must be combined with clinical observations, patient history, and epidemiological information. The expected result is Negative. Fact Sheet for Patients:  https://www.moore.com/ Fact Sheet for Healthcare Providers:  https://www.young.biz/ This test is not yet ap proved or cleared by the [place] FDA and  has been authorized for detection and/or diagnosis of SARS-CoV-2 by FDA under an Emergency Use Authorization (EUA). This EUA will remain  in effect (meaning this test can be used) for the duration of the COVID-19 declaration under Section 564(b)(1) of the Act, 21 U.S.C. section 360bbb-3(b)(1), unless the authorization is terminated or revoked sooner.    Influenza A by PCR NEGATIVE NEGATIVE Final   Influenza B by PCR NEGATIVE NEGATIVE Final    Comment: (NOTE) The Xpert Xpress SARS-CoV-2/FLU/RSV assay is intended as an aid in  the diagnosis of influenza from Nasopharyngeal swab specimens and  should not be used as a sole basis for treatment. Nasal washings and  aspirates are unacceptable for Xpert Xpress SARS-CoV-2/FLU/RSV  testing. Fact Sheet for Patients: https://www.moore.com/ Fact Sheet for Healthcare Providers: https://www.young.biz/ This test is not yet approved or cleared by the [place] FDA and  has been authorized for detection and/or diagnosis of SARS-CoV-2 by  FDA under an Emergency Use Authorization (EUA).  This EUA will remain  in effect (meaning this test can be used) for the duration of the  Covid-19 declaration under Section 564(b)(1) of the Act, 21  U.S.C. section 360bbb-3(b)(1), unless the authorization is  terminated or revoked. Performed at [facility], [address] [place], [place] [address]   Culture, blood (routine x 2)     Status: None (Preliminary result)   Collection Time: 05/18/19 12:00 PM   Specimen: BLOOD  Result Value Ref Range Status   Specimen Description BLOOD BLOOD RIGHT HAND  Final   Special Requests   Final    BOTTLES DRAWN AEROBIC AND ANAEROBIC Blood Culture adequate volume   Culture   Final    NO GROWTH 2 DAYS Performed at [facility], [address], [place], [place] [address]    Report Status PENDING  Incomplete  Culture, blood (routine x 2)     Status: None (Preliminary result)   Collection Time: 05/18/19 12:01 PM   Specimen: BLOOD  Result Value Ref Range Status   Specimen Description BLOOD LEFT ANTECUBITAL  Final   Special Requests   Final    BOTTLES DRAWN AEROBIC AND  ANAEROBIC Blood Culture adequate volume   Culture   Final    NO GROWTH 2 DAYS Performed at [facility], [address] [place], [place] [address]    Report Status PENDING  Incomplete  Urine culture     Status: Abnormal   Collection Time: 05/18/19 12:02 PM   Specimen: Urine, Random  Result Value Ref Range Status   Specimen Description   Final    URINE, RANDOM Performed at [facility], [address] [place], [place] [address]    Special Requests   Final    NONE Performed at [facility], [address] [place], [place] [address]    Culture >=100,000 COLONIES/mL ESCHERICHIA COLI (A)  Final   Report Status 05/20/2019 FINAL  Final   Organism ID, Bacteria ESCHERICHIA COLI (A)  Final      Susceptibility   Escherichia coli - MIC*    AMPICILLIN 8 SENSITIVE Sensitive     CEFAZOLIN <=4 SENSITIVE Sensitive     CEFTRIAXONE  <=0.25 SENSITIVE Sensitive     CIPROFLOXACIN <=0.25 SENSITIVE Sensitive     GENTAMICIN <=1 SENSITIVE Sensitive     IMIPENEM <=0.25 SENSITIVE Sensitive     NITROFURANTOIN >=512 RESISTANT Resistant     TRIMETH/SULFA <=20 SENSITIVE Sensitive     AMPICILLIN/SULBACTAM 4 SENSITIVE Sensitive     PIP/TAZO <=4 SENSITIVE Sensitive     * >=100,000 COLONIES/mL ESCHERICHIA COLI  SARS CORONAVIRUS 2 (TAT 6-24 HRS) Nasopharyngeal Nasopharyngeal Swab     Status: None   Collection Time: 05/18/19  1:37 PM   Specimen: Nasopharyngeal Swab  Result Value Ref Range Status   SARS Coronavirus 2 NEGATIVE NEGATIVE Final    Comment: (NOTE) SARS-CoV-2 target nucleic acids are NOT DETECTED. The SARS-CoV-2 RNA is generally detectable in upper and lower respiratory specimens during the acute phase of infection. Negative results do not preclude SARS-CoV-2 infection, do not rule out co-infections with other pathogens, and should not be used as the sole basis for treatment or other patient management decisions. Negative results must be combined with clinical observations, patient history, and epidemiological information. The expected result is Negative. Fact Sheet for Patients: HairSlick.no Fact Sheet for Healthcare Providers: quierodirigir.com This test is not yet approved or cleared by the [place] FDA and  has been authorized for detection and/or diagnosis of SARS-CoV-2 by FDA under an Emergency Use Authorization (EUA). This EUA will remain  in effect (meaning this test can be used) for the duration of the COVID-19 declaration under Section 56 4(b)(1) of the Act, 21 U.S.C. section 360bbb-3(b)(1), unless the authorization is terminated or revoked sooner. Performed at [facility], [address], [place], [place] [address]          Radiology Studies: No results found.      Scheduled Meds: . aspirin EC  81 mg Oral QHS  . B-complex with  vitamin C  1 tablet Oral Daily  . calcium acetate  667 mg Oral BID  . calcium-vitamin D  1 tablet Oral Daily  . cholecalciferol  1,000 Units Oral Daily  . docusate sodium  100 mg Oral BID  . enoxaparin  40 mg Subcutaneous Q24H  . feeding supplement (ENSURE ENLIVE)  237 mL Oral BID BM  . ferrous fumarate-b12-vitamic C-folic acid  1 capsule Oral BID  . fluticasone furoate-vilanterol  1 puff Inhalation Daily  . loratadine  10 mg Oral Daily  . midodrine  5 mg Oral TID WC  . multivitamin with minerals  1  tablet Oral Daily  . nicotine  14 mg Transdermal Daily  . oxybutynin  5 mg Oral TID  . pantoprazole  40 mg Oral Daily  . pregabalin  100 mg Oral TID  . rosuvastatin  10 mg Oral QHS  . tiotropium  1 capsule Inhalation Daily   Continuous Infusions: . cefTRIAXone (ROCEPHIN)  IV Stopped (05/19/19 1900)    Assessment & Plan:   Active Problems:   UTI (urinary tract infection)   1. UTI- pt is hypotensive, but I think is more from being on beta blk and dehydration. V.s. sepsis. mild leukocytosis, no tachycardia Urine culture with E. Coli-sensitivity pending Continue on Rocephin IV daily Continue blood cultures covid negative  2.Hypotension-sepsis v.s. dehyration/beta blk.  Has had this problem in the past during her hospitalization She is mildly symptomatic Will increase midodrine to 5 mg 3 times daily continue with gentle hydration monitor volume status   3.COPD (chronic obstructive pulmonary disease) (HCC)-not in exacerbation -Continue home bronchodilators, scheduled and as needed  4.Essential hypertension -hold home meds due to hypotension  5.  Tobacco abuse-patient is trying to quit smoking per her husband. Encouraged to continue to try quitting   DVT prophylaxis: lovenox Code Status:full  Family Communication:  Husband Disposition Plan:  Back home, does not want to go back to [employer].  Wants home health Barrier: Patient still quite hypotensive requiring IV  fluids.  Requiring IV antibiotics as she is unable to take p.o. intake well yet.  Possible DC in 1 to 2 days if blood pressure improves and able to take the p.o. medication     LOS: 1 day   Time spent: 45 minutes with more than 50% COC    Lynn Ito, MD [facility] Pager [phone]  If 7PM-7AM, please contact night-coverage [email or web address] Password [facility] 05/20/2019, 12:26 PM Patient ID: CAMEKA RAE, female   DOB: 10-28-1940, 79 y.o.   MRN: [id number]

## 2019-05-20 NOTE — Plan of Care (Signed)

## 2019-05-20 NOTE — Progress Notes (Signed)
PT Cancellation Note  Patient Details Name: Holly Shelton MRN: [id number] DOB: 10-Sep-1940   Cancelled Treatment:    Reason Eval/Treat Not Completed: Patient's level of consciousness. Patient can not keep her eyes open. Her O2 saturation was not recording on the pulse ox and nursing was called into the room to assess patient due to her lethargic and sleepy level of alertness.     [address] [place], [place] DPT 05/20/2019, 12:14 PM

## 2019-05-20 NOTE — Progress Notes (Signed)
Barron Schmid NP at bedside to assess patient, patient responding appropriately, VS stable. Discussed with Webb Silversmith NP as well. MEWS at 1, will continue to monitor.

## 2019-05-20 NOTE — Plan of Care (Signed)
  Problem: Urinary Elimination: Goal: Signs and symptoms of infection will decrease Outcome: Progressing   Problem: Education: Goal: Knowledge of General Education information will improve Description: Including pain rating scale, medication(s)/side effects and non-pharmacologic comfort measures Outcome: Progressing   Problem: Health Behavior/Discharge Planning: Goal: Ability to manage health-related needs will improve Outcome: Progressing   Problem: Clinical Measurements: Goal: Will remain free from infection Outcome: Progressing   Problem: Activity: Goal: Risk for activity intolerance will decrease Outcome: Progressing   Problem: Nutrition: Goal: Adequate nutrition will be maintained Outcome: Progressing   Problem: Elimination: Goal: Will not experience complications related to bowel motility Outcome: Progressing   Problem: Pain Managment: Goal: General experience of comfort will improve Outcome: Progressing

## 2019-05-20 NOTE — Progress Notes (Addendum)
This nurse was called into room by PT d/t patient's low O2 sats and LOC. Patient received 50mg  Tramadol this morning for pain and this made her sleepy.  Patient's O2 sats are at 100% on 2L oxygen and patient is awake and speaking with at this time.

## 2019-05-21 DIAGNOSIS — I10 Essential (primary) hypertension: Secondary | ICD-10-CM

## 2019-05-21 DIAGNOSIS — I959 Hypotension, unspecified: Secondary | ICD-10-CM

## 2019-05-21 LAB — COMPREHENSIVE METABOLIC PANEL
ALT: 13 U/L (ref 0–44)
AST: 30 U/L (ref 15–41)
Albumin: 1.9 g/dL — ABNORMAL LOW (ref 3.5–5.0)
Alkaline Phosphatase: 488 U/L — ABNORMAL HIGH (ref 38–126)
Anion gap: 7 (ref 5–15)
BUN: 12 mg/dL (ref 8–23)
CO2: 28 mmol/L (ref 22–32)
Calcium: 9.7 mg/dL (ref 8.9–10.3)
Chloride: 100 mmol/L (ref 98–111)
Creatinine, Ser: 0.67 mg/dL (ref 0.44–1.00)
GFR calc Af Amer: >60 mL/min (ref >60)
GFR calc non Af Amer: >60 mL/min (ref >60)
Glucose, Bld: 96 mg/dL (ref 70–99)
Potassium: 3.8 mmol/L (ref 3.5–5.1)
Sodium: 135 mmol/L (ref 135–145)
Total Bilirubin: 1.1 mg/dL (ref 0.3–1.2)
Total Protein: 5.1 g/dL — ABNORMAL LOW (ref 6.5–8.1)

## 2019-05-21 MED ORDER — POLYETHYLENE GLYCOL 3350 17 G PO PACK
17.0000 g | PACK | Freq: Every day | ORAL | Status: DC
  Administered 2019-05-21 – 2019-05-22 (×2): 17 g via ORAL
  Filled 2019-05-21 (×2): qty 1

## 2019-05-21 MED ORDER — MIDODRINE HCL 5 MG PO TABS
10.0000 mg | ORAL_TABLET | Freq: Three times a day (TID) | ORAL | Status: DC
  Administered 2019-05-21 – 2019-05-23 (×7): 10 mg via ORAL
  Filled 2019-05-21 (×7): qty 2

## 2019-05-21 NOTE — NC FL2 (Signed)
[place] LEVEL OF CARE SCREENING TOOL     IDENTIFICATION  Patient Name: Holly Shelton Birthdate: [birth year] Sex: female Admission Date (Current Location): 05/18/2019  [place] and [place] Number:  Engineering geologist and Address:  [facility], [address] [place], [place] [address]      Provider Number: [id number]  Attending Physician Name and Address:  Nolberto Hanlon, MD  Relative Name and Phone Number:  Navie Lamoreaux [phone]    Current Level of Care: Hospital Recommended Level of Care: [place] Prior Approval Number:    Date Approved/Denied:   PASRR Number: [id number]  Discharge Plan: SNF    Current Diagnoses: Patient Active Problem List   Diagnosis Date Noted  . UTI (urinary tract infection) 05/18/2019  . Pre-op chest exam   . S/P right hip fracture   . Esophageal dysphagia   . Fall   . Closed displaced subtrochanteric fracture of right femur ([place]) 05/06/2019  . COPD (chronic obstructive pulmonary disease) ([place]) 05/06/2019  . Essential hypertension 05/06/2019  . Tobacco use 05/06/2019  . History of recurrent UTI (urinary tract infection) 05/06/2019  . GERD (gastroesophageal reflux disease) 05/06/2019  . Aneurysm of descending thoracic aorta ([place]) 05/06/2019  . Closed fracture of right hip ([place]) 05/06/2019    Orientation RESPIRATION BLADDER Height & Weight     Self, Time, Situation, Place  Normal Continent Weight: 156 lb 8.4 oz (71 kg) Height:  5' (152.4 cm)  BEHAVIORAL SYMPTOMS/MOOD NEUROLOGICAL BOWEL NUTRITION STATUS      Continent    AMBULATORY STATUS COMMUNICATION OF NEEDS Skin   Extensive Assist Verbally Surgical wounds, Normal                       Personal Care Assistance Level of Assistance  Bathing, Dressing, Feeding Bathing Assistance: Limited assistance Feeding assistance: Limited assistance Dressing Assistance: Limited assistance     Functional Limitations Info   Sight, Hearing, Speech Sight Info: Adequate Hearing Info: Adequate Speech Info: Adequate    SPECIAL CARE FACTORS FREQUENCY  PT (By licensed PT), OT (By licensed OT)     PT Frequency: 5x week OT Frequency: 5x week            Contractures Contractures Info: Not present    Additional Factors Info  Code Status, Allergies Code Status Info: Full Allergies Info: penicillin           Current Medications (05/21/2019):  This is the current hospital active medication list Current Facility-Administered Medications  Medication Dose Route Frequency Provider Last Rate Last Admin  . albuterol (PROVENTIL) (2.5 MG/3ML) 0.083% nebulizer solution 3 mL  3 mL Inhalation Q6H PRN Nolberto Hanlon, MD      . alum & mag hydroxide-simeth (MAALOX/MYLANTA) 200-200-20 MG/5ML suspension 30 mL  30 mL Oral Q4H PRN Nolberto Hanlon, MD      . aspirin EC tablet 81 mg  81 mg Oral QHS Nolberto Hanlon, MD   81 mg at 05/20/19 2119  . B-complex with vitamin C tablet 1 tablet  1 tablet Oral Daily Nolberto Hanlon, MD   1 tablet at 05/21/19 0929  . bisacodyl (DULCOLAX) suppository 10 mg  10 mg Rectal Daily PRN Nolberto Hanlon, MD      . calcium acetate (PHOSLO) capsule 667 mg  667 mg Oral BID Nolberto Hanlon, MD   667 mg at 05/21/19 0929  . calcium-vitamin D (OSCAL WITH D) 500-200 MG-UNIT per tablet 1 tablet  1 tablet Oral Daily  Lynn Ito, MD   1 tablet at 05/21/19 [phone]  . cefTRIAXone (ROCEPHIN) 1 g in sodium chloride 0.9 % 100 mL IVPB  1 g Intravenous Q24H Lynn Ito, MD 200 mL/hr at 05/20/19 1409 1 g at 05/20/19 1409  . cholecalciferol (VITAMIN D3) tablet 1,000 Units  1,000 Units Oral Daily Lynn Ito, MD   1,000 Units at 05/21/19 0928  . docusate sodium (COLACE) capsule 100 mg  100 mg Oral BID Lynn Ito, MD   100 mg at 05/21/19 0929  . enoxaparin (LOVENOX) injection 40 mg  40 mg Subcutaneous Q24H Lynn Ito, MD   40 mg at 05/20/19 2120  . feeding supplement (ENSURE ENLIVE) (ENSURE ENLIVE) liquid 237 mL  237 mL Oral BID BM  Lynn Ito, MD   237 mL at 05/18/19 1821  . ferrous fumarate-b12-vitamic C-folic acid (TRINSICON / FOLTRIN) capsule 1 capsule  1 capsule Oral BID Lynn Ito, MD   1 capsule at 05/21/19 0929  . fluticasone furoate-vilanterol (BREO ELLIPTA) 200-25 MCG/INH 1 puff  1 puff Inhalation Daily Lynn Ito, MD   1 puff at 05/21/19 0930  . loratadine (CLARITIN) tablet 10 mg  10 mg Oral Daily Lynn Ito, MD   10 mg at 05/21/19 0928  . midodrine (PROAMATINE) tablet 10 mg  10 mg Oral TID WC Lynn Ito, MD      . multivitamin with minerals tablet 1 tablet  1 tablet Oral Daily Lynn Ito, MD   1 tablet at 05/21/19 0928  . nicotine (NICODERM CQ - dosed in mg/24 hours) patch 14 mg  14 mg Transdermal Daily Lynn Ito, MD      . oxybutynin (DITROPAN) tablet 5 mg  5 mg Oral TID Lynn Ito, MD   5 mg at 05/21/19 0928  . pantoprazole (PROTONIX) EC tablet 40 mg  40 mg Oral Daily Lynn Ito, MD   40 mg at 05/21/19 0929  . polyethylene glycol (MIRALAX / GLYCOLAX) packet 17 g  17 g Oral Daily Lynn Ito, MD      . pregabalin (LYRICA) capsule 100 mg  100 mg Oral TID Lynn Ito, MD   100 mg at 05/21/19 0928  . rosuvastatin (CRESTOR) tablet 10 mg  10 mg Oral QHS Lynn Ito, MD   10 mg at 05/20/19 2119  . tiotropium (SPIRIVA) inhalation capsule ([facility] use ONLY) 18 mcg  1 capsule Inhalation Daily Lynn Ito, MD   18 mcg at 05/21/19 0930  . traMADol (ULTRAM) tablet 25 mg  25 mg Oral Q6H PRN Lynn Ito, MD         Discharge Medications: Please see discharge summary for a list of discharge medications.  Relevant Imaging Results:  Relevant Lab Results:   Additional Information [phone] [place], LCSW

## 2019-05-21 NOTE — TOC Progression Note (Signed)
Transition of Care [facility]) - Progression Note    Patient Details  Name: Holly Shelton MRN: [id number] Date of Birth: 12-Aug-1940  Transition of Care [facility]) CM/SW Contact  Maud Deed, [place] Phone Number: 05/21/2019, 11:54 AM  Clinical Narrative:    CSW was contacted by MD of pt's plan to go back to SNF. MD stated that pt and pt's husband changed their mind and wanted pt to go back to [facility] for rehab. CSW contacted pt to confirm. CSW contacted [name] with [facility] to notify her that pt wanted to return. [name] stated that she would start authorization. CSW completed FL2 and PASRR. Pt is  expected to discharge tomorrow 4/19  TOC will continue to follow for discharge planning needs.    Expected Discharge Plan: Home w Home Health Services Barriers to Discharge: Continued Medical Work up  Expected Discharge Plan and Services Expected Discharge Plan: Home w Home Health Services   Discharge Planning Services: CM Consult   Living arrangements for the past 2 months: Single Family Home                 DME Arranged: N/A         HH Arranged: RN, PT, OT, Nurse's Aide HH Agency: [facility] Date HH Agency Contacted: 05/19/19 Time HH Agency Contacted: 1024 Representative spoke with at [facility] Agency: Rosey Bath   Social Determinants of Health (SDOH) Interventions    Readmission Risk Interventions No flowsheet data found.

## 2019-05-21 NOTE — Evaluation (Signed)
Physical Therapy Evaluation Patient Details Name: Holly Shelton MRN: [id number] DOB: October 18, 1940 Today's Date: 05/21/2019   History of Present Illness  Holly Shelton is a 79 y.o. female with medical history significant for HTN, COPD, GERD, AAA thoracic descending aorta, recurrent UTIs on nitrofurantoin and tobacco use who presents to the emergency room  From [facility] for change in mental status.  Per ER nursing note patient was febrile at 101.1. Per EMS patient 90% on room air and was placed on 3 L with improvement to 96%. Patient recently discharged from the hospital on 05/11/2019 after R subtrochanteric IM nail s/p fall.  Clinical Impression  Pt is a pleasant 79 year old F who was admitted for UTI, though with recent post op status (s/p R hip IM nailing, 05/07/19), and was [facility] to SNF. Pt is generally mildly anxious with mobility but willingly participates. Pt performs bed mobility with max A, requiring assistance to manage LEs and trunk, and demos difficulty sitting edge of bed without UE support. Deferred transfers and ambulation with today for safety concerns secondary to pt's weakness. Pt reporting "I feel like I'm going to pass out" upon initial attempt at supine>sit; vitals assessed and BP, O2 and HR all within acceptable ranges. Pt demonstrates deficits with strength, range of motion, pain, balance requiring continued skilled PT intervention to promote return to prior level of function.     Follow Up Recommendations SNF    Equipment Recommendations  Rolling walker with 5" wheels;3in1 (PT)    Recommendations for Other Services OT consult     Precautions / Restrictions Precautions Precautions: Fall Restrictions Weight Bearing Restrictions: Yes RLE Weight Bearing: Weight bearing as tolerated      Mobility  Bed Mobility Overal bed mobility: Needs Assistance Bed Mobility: Supine to Sit;Sit to Supine     Supine to sit: Max assist;HOB elevated(for trunk and LEs (primarily  RLE)) Sit to supine: Max assist;HOB elevated(for trunk and LEs)   General bed mobility comments: Able to participate in supine ther ex including AAROM hip abduction (R), QS (R side), AAROM heel slides  Transfers                 General transfer comment: Deferred transfers today for safety concerns; pt unable to sit steady edge of bed without UE support  Ambulation/Gait             General Gait Details: Deferred gait today for safety  Stairs            Wheelchair Mobility    Modified Rankin (Stroke Patients Only)       Balance Overall balance assessment: Needs assistance Sitting-balance support: Single extremity supported;Feet supported Sitting balance-Leahy Scale: Poor Sitting balance - Comments: Pt able to sit edge of bed with use of 1 UE support; however unable to without UE support, requring physical assist Postural control: Posterior lean;Right lateral lean     Standing balance comment: Deferred                             Pertinent Vitals/Pain Pain Assessment: 0-10 Pain Score: 5  Pain Location: R hip; reporting 9/10 at end of session Pain Descriptors / Indicators: Aching;Sore Pain Intervention(s): Limited activity within patient's tolerance;Monitored during session;Repositioned    Home Living Family/patient expects to be discharged to:: Skilled nursing facility Living Arrangements: Spouse/significant other;Other relatives(husband, daughter, son in law) Available Help at Discharge: Family;Available 24 hours/day Type of Home: Mobile home Home Access:  Ramped entrance;Stairs to enter   Entrance Stairs-Number of Steps: ramped entry at back door (able to access) Home Layout: One level Home Equipment: Grab bars - tub/shower;Walker - 4 wheels;Cane - single point      Prior Function Level of Independence: Needs assistance   Gait / Transfers Assistance Needed: Household ambulation with cane; requiring assistance for bathing, cooking; per  pt's spouse, ambulated 5' at SNF     Comments: Denies falls in past six months other than most recent fall that resulted in this hospitalization     Hand Dominance        Extremity/Trunk Assessment   Upper Extremity Assessment Upper Extremity Assessment: Generalized weakness;Defer to OT evaluation    Lower Extremity Assessment Lower Extremity Assessment: Generalized weakness;RLE deficits/detail;LLE deficits/detail RLE Deficits / Details: Difficulty performing quad set (fatigues quickly, able to hold 1 sec); requiring assist for hip abd in supine RLE: Unable to fully assess due to pain RLE Sensation: WNL LLE Deficits / Details: Grossly 4-/5 knee ext; hip abd grossly 3-/5 (full range, gravity-eliminated) LLE Sensation: WNL       Communication   Communication: No difficulties  Cognition Arousal/Alertness: Awake/alert Behavior During Therapy: WFL for tasks assessed/performed Overall Cognitive Status: Within Functional Limits for tasks assessed                                        General Comments      Exercises Total Joint Exercises Quad Sets: AROM;Right;Strengthening;5 reps;Supine Heel Slides: AAROM;5 reps;Both Hip ABduction/ADduction: AAROM;Right;5 reps   Assessment/Plan    PT Assessment Patient needs continued PT services  PT Problem List Decreased strength;Decreased mobility;Decreased safety awareness;Decreased range of motion;Decreased activity tolerance;Decreased balance;Pain       PT Treatment Interventions DME instruction;Therapeutic exercise;Gait training;Balance training;Neuromuscular re-education;Functional mobility training;Patient/family education;Therapeutic activities    PT Goals (Current goals can be found in the Care Plan section)  Acute Rehab PT Goals Patient Stated Goal: go home PT Goal Formulation: With patient/family Time For Goal Achievement: 06/23/2019 Potential to Achieve Goals: Good    Frequency BID   Barriers to  discharge        Co-evaluation               AM-PAC PT "6 Clicks" Mobility  Outcome Measure Help needed turning from your back to your side while in a flat bed without using bedrails?: A Lot Help needed moving from lying on your back to sitting on the side of a flat bed without using bedrails?: A Lot Help needed moving to and from a bed to a chair (including a wheelchair)?: Total Help needed standing up from a chair using your arms (e.g., wheelchair or bedside chair)?: Total Help needed to walk in hospital room?: Total Help needed climbing 3-5 steps with a railing? : Total 6 Click Score: 8    End of Session Equipment Utilized During Treatment: Oxygen Activity Tolerance: Patient limited by fatigue Patient left: in bed;with call bell/phone within reach;with family/visitor present;with bed alarm set Nurse Communication: Mobility status PT Visit Diagnosis: Unsteadiness on feet (R26.81);Muscle weakness (generalized) (M62.81);Pain;Other abnormalities of gait and mobility (R26.89);History of falling (Z91.81) Pain - Right/Left: Right Pain - part of body: Hip    Time: 0820-0903 PT Time Calculation (min) (ACUTE ONLY): 43 min   Charges:   PT Evaluation $PT Eval Moderate Complexity: 1 Mod PT Treatments $Therapeutic Exercise: 8-22 mins  Petra Kuba, PT, DPT 05/21/19, 9:28 AM

## 2019-05-21 NOTE — Evaluation (Signed)
Occupational Therapy Evaluation Patient Details Name: SHIYA FOGELMAN MRN: [id number] DOB: [birth year] Today's Date: 05/21/2019    History of Present Illness Hargun Starzyk is a 79 y.o. female with medical history significant for HTN, COPD, GERD, AAA thoracic descending aorta, recurrent UTIs on nitrofurantoin and tobacco use who presents to the emergency room  From [facility] commons for change in mental status.  Per ER nursing note patient was febrile at 101.1. Per EMS patient 90% on room air and was placed on 3 L with improvement to 96%. Patient recently discharged from the hospital on 05/11/2019 after R subtrochanteric IM nail s/p fall.   Clinical Impression   Ms Holck was seen for OT evaluation this date. Prior to hospital admission, pt was at a SNF. Pt presents to acute OT demonstrating impaired ADL performance and functional mobility 2/2 decreased LB access, functional strength/ROM/balance deficits, and decreased activity tolerance. Pt currently requires SETUP for grooming tasks seated in bed (SpO2 95% on 2L [place] at start of session, pt desat to 88% on RA after [place] removed for face washing - resolved c PLB and 2L [place]). Pt deferred mobility assessment this date stating she was exhausted from PT session. Pt would benefit from skilled OT to address noted impairments and functional limitations (see below for any additional details) in order to maximize safety and independence while minimizing falls risk and caregiver burden. Upon hospital discharge, recommend STR to maximize pt safety and return to PLOF.     Follow Up Recommendations  SNF    Equipment Recommendations  Other (comment)(To be determined at next venue of care)    Recommendations for Other Services       Precautions / Restrictions Precautions Precautions: Fall Restrictions Weight Bearing Restrictions: Yes RLE Weight Bearing: Weight bearing as tolerated      Mobility Bed Mobility Overal bed mobility: Needs Assistance Bed  Mobility: Supine to Sit;Sit to Supine     Supine to sit: Max assist;HOB elevated(for trunk and LEs (primarily RLE)) Sit to supine: Max assist;HOB elevated(for trunk and LEs)   General bed mobility comments: Pt deferred 2/2 fatigue following PT session  Transfers                 General transfer comment: Deferred transfers today for safety concerns; pt unable to sit steady edge of bed without UE support    Balance Overall balance assessment: Needs assistance Sitting-balance support: Single extremity supported;Feet supported Sitting balance-Leahy Scale: Poor Sitting balance - Comments: Pt able to sit edge of bed with use of 1 UE support; however unable to without UE support, requring physical assist Postural control: Posterior lean;Right lateral lean     Standing balance comment: Deferred                           ADL either performed or assessed with clinical judgement   ADL Overall ADL's : Needs assistance/impaired                                       General ADL Comments: SETUP tooth brushing and face washing seated in bed. MAX A self-drinking, seated in bed     Vision         Perception     Praxis      Pertinent Vitals/Pain Pain Assessment: No/denies pain Pain Score: 5  Pain Location: R hip; reporting 9/10 at end  of session Pain Descriptors / Indicators: Aching;Sore Pain Intervention(s): Limited activity within patient's tolerance;Monitored during session;Repositioned     Hand Dominance Left   Extremity/Trunk Assessment Upper Extremity Assessment Upper Extremity Assessment: Generalized weakness(B grip 3/5, B shoulder flexion ~90*)   Lower Extremity Assessment Lower Extremity Assessment: Generalized weakness RLE Deficits / Details: Difficulty performing quad set (fatigues quickly, able to hold 1 sec); requiring assist for hip abd in supine RLE: Unable to fully assess due to pain RLE Sensation: WNL LLE Deficits / Details:  Grossly 4-/5 knee ext; hip abd grossly 3-/5 (full range, gravity-eliminated) LLE Sensation: WNL       Communication Communication Communication: No difficulties   Cognition Arousal/Alertness: Awake/alert Behavior During Therapy: WFL for tasks assessed/performed Overall Cognitive Status: Within Functional Limits for tasks assessed                                     General Comments  SpO2 95% on 2L [place]. Desat to 88% on RA ([place] removed for face washing). Resolved to 100% on 2L [place] c PLB    Exercises Exercises: Other exercises Total Joint Exercises Quad Sets: AROM;Right;Strengthening;5 reps;Supine Heel Slides: AAROM;5 reps;Both Hip ABduction/ADduction: AAROM;Right;5 reps Other Exercises Other Exercises: Pt and caregiver educated re: falls prevention, energy conservation strategies, home/routines modicifactions Other Exercises: Tooth brushing, self-drinking, face washing, PLB   Shoulder Instructions      Home Living Family/patient expects to be discharged to:: Skilled nursing facility Living Arrangements: Spouse/significant other;Other relatives(husband, daughter, son in law) Available Help at Discharge: Family;Available 24 hours/day Type of Home: Mobile home Home Access: Ramped entrance;Stairs to enter Entrance Stairs-Number of Steps: ramped entry at back door (able to access)   Home Layout: One level     Bathroom Shower/Tub: Walk-in shower;Tub/shower unit( with built in shower chair)   Bathroom Toilet: [place]: Grab bars - tub/shower;Walker - 4 wheels;Cane - single point;Shower seat - built in          Prior Functioning/Environment Level of Independence: Needs assistance  Gait / Transfers Assistance Needed: Household ambulation with cane; requiring assistance for bathing, cooking; per pt's spouse, ambulated 5' at SNF     Comments: Pt sleeps on couch at baseline        OT Problem List: Decreased strength;Decreased range of  motion;Decreased activity tolerance;Impaired balance (sitting and/or standing)      OT Treatment/Interventions: Self-care/ADL training;Therapeutic exercise;Energy conservation;DME and/or AE instruction;Therapeutic activities;Cognitive remediation/compensation;Patient/family education;Balance training    OT Goals(Current goals can be found in the care plan section) Acute Rehab OT Goals Patient Stated Goal: go home OT Goal Formulation: With patient/family Time For Goal Achievement: 06-22-2019 Potential to Achieve Goals: Fair ADL Goals Pt Will Perform Grooming: with min assist;sitting;with set-up Pt Will Perform Upper Body Dressing: bed level;with min guard assist Pt Will Transfer to Toilet: with max assist;squat pivot transfer;bedside commode(c LRAD PRN)  OT Frequency: Min 2X/week   Barriers to D/C: Inaccessible home environment          Co-evaluation              AM-PAC OT "6 Clicks" Daily Activity     Outcome Measure Help from another person eating meals?: A Little Help from another person taking care of personal grooming?: A Little Help from another person toileting, which includes using toliet, bedpan, or urinal?: A Lot Help from another person bathing (including washing, rinsing, drying)?: A Lot  Help from another person to put on and taking off regular upper body clothing?: A Little Help from another person to put on and taking off regular lower body clothing?: A Lot 6 Click Score: 15   End of Session Equipment Utilized During Treatment: Oxygen(2L [place])  Activity Tolerance: Patient limited by fatigue Patient left: in bed;with call bell/phone within reach;with bed alarm set;with family/visitor present  OT Visit Diagnosis: Unsteadiness on feet (R26.81);Other abnormalities of gait and mobility (R26.89)                Time: 3491-7915 OT Time Calculation (min): 19 min Charges:  OT General Charges $OT Visit: 1 Visit OT Evaluation $OT Eval Moderate Complexity: 1 Mod OT  Treatments $Self Care/Home Management : 8-22 mins  Kathie Dike, M.S. OTR/L  05/21/19, 11:21 AM

## 2019-05-21 NOTE — Progress Notes (Signed)
Holly Shelton Kitchen  PROGRESS NOTE    Holly Shelton  TMH:[id number] DOB: [birth year] DOA: 05/18/2019 PCP: Patient, No Pcp Per    Brief Narrative:  Holly Shelton a 79 y.o.femalewith medical history significant forHTN, COPD, GERD, AAA thoracic descending aorta, recurrent UTIs on nitrofurantoin and tobacco use who presents to the emergency room  From [facility] commons for change in mental status.  Found with UTI    Consultants:   None  Procedures:   Antimicrobials:   Rocephin started on 05/18/2019   Subjective: No bm. No there complaints.  Husband at bedside. Objective: Vitals:   05/20/19 1716 05/21/19 0231 05/21/19 0755 05/21/19 0909  BP: (!) 92/56 (!) 90/45 (!) 104/53 (!) 106/52  Pulse: 81 81 82   Resp: 16 16 16    Temp: 97.6 F (36.4 C) 97.8 F (36.6 C) 99.1 F (37.3 C)   TempSrc: Oral Oral Axillary   SpO2: 100% 98% 99% 96%  Weight:      Height:        Intake/Output Summary (Last 24 hours) at 05/21/2019 1144 Last data filed at 05/21/2019 0755 Gross per 24 hour  Intake 100 ml  Output 1000 ml  Net -900 ml   Filed Weights   05/18/19 1144  Weight: 71 kg    Examination:  General exam: Appears calm and comfortable, sitting in bed, NAD husband at bedside Respiratory system: Clear to auscultation. Respiratory effort normal.  No wheeze or Rales or rhonchi Cardiovascular system: S1 & S2 heard, RRR. No JVD, murmurs, rubs, gallops or clicks.  Gastrointestinal system: Abdomen is nondistended, soft and nontender. . Normal bowel sounds heard. Central nervous system: Alert and oriented x3.  Grossly intact Extremities: No edema Skin: Warm dry Psychiatry: Judgement and insight appear normal. Mood & affect appropriate in current setting.     Data Reviewed: I have personally reviewed following labs and imaging studies  CBC: Recent Labs  Lab 05/18/19 1200 05/20/19 0508  WBC 11.7* 10.3  NEUTROABS 8.4*  --   HGB 8.3* 7.9*  HCT 26.2* 24.9*  MCV 95.3 96.1  PLT 483* 436*    Basic Metabolic Panel: Recent Labs  Lab 05/18/19 1200 05/19/19 0455  NA 137 136  K 3.5 3.8  CL 102 101  CO2 25 23  GLUCOSE 85 80  BUN 19 16  CREATININE 0.95 0.82  CALCIUM 9.7 9.5   GFR: Estimated Creatinine Clearance: 51.3 mL/min (by C-G formula based on SCr of 0.82 mg/dL). Liver Function Tests: Recent Labs  Lab 05/18/19 1200 05/19/19 0455  AST 29 33  ALT 14 14  ALKPHOS 548* 495*  BILITOT 1.3* 0.9  PROT 5.3* 5.2*  ALBUMIN 2.0* 1.9*   No results for input(s): LIPASE, AMYLASE in the last 168 hours. No results for input(s): AMMONIA in the last 168 hours. Coagulation Profile: Recent Labs  Lab 05/19/19 0455  INR 1.2   Cardiac Enzymes: No results for input(s): CKTOTAL, CKMB, CKMBINDEX, TROPONINI in the last 168 hours. BNP (last 3 results) No results for input(s): PROBNP in the last 8760 hours. HbA1C: No results for input(s): HGBA1C in the last 72 hours. CBG: No results for input(s): GLUCAP in the last 168 hours. Lipid Profile: No results for input(s): CHOL, HDL, LDLCALC, TRIG, CHOLHDL, LDLDIRECT in the last 72 hours. Thyroid Function Tests: No results for input(s): TSH, T4TOTAL, FREET4, T3FREE, THYROIDAB in the last 72 hours. Anemia Panel: No results for input(s): VITAMINB12, FOLATE, FERRITIN, TIBC, IRON, RETICCTPCT in the last 72 hours. Sepsis Labs: Recent Labs  Lab 05/18/19  1200 05/18/19 1337 05/19/19 0455  PROCALCITON  --   --  0.73  LATICACIDVEN 1.3 1.5  --     Recent Results (from the past 240 hour(s))  Culture, blood (routine x 2)     Status: None (Preliminary result)   Collection Time: 05/18/19 12:00 PM   Specimen: BLOOD  Result Value Ref Range Status   Specimen Description BLOOD BLOOD RIGHT HAND  Final   Special Requests   Final    BOTTLES DRAWN AEROBIC AND ANAEROBIC Blood Culture adequate volume   Culture   Final    NO GROWTH 3 DAYS Performed at [facility], [address], [place], [place] [address]    Report Status PENDING   Incomplete  Culture, blood (routine x 2)     Status: None (Preliminary result)   Collection Time: 05/18/19 12:01 PM   Specimen: BLOOD  Result Value Ref Range Status   Specimen Description BLOOD LEFT ANTECUBITAL  Final   Special Requests   Final    BOTTLES DRAWN AEROBIC AND ANAEROBIC Blood Culture adequate volume   Culture   Final    NO GROWTH 3 DAYS Performed at [facility], [address] [place], [place] [address]    Report Status PENDING  Incomplete  Urine culture     Status: Abnormal   Collection Time: 05/18/19 12:02 PM   Specimen: Urine, Random  Result Value Ref Range Status   Specimen Description   Final    URINE, RANDOM Performed at [facility], [address] [place], [place] [address]    Special Requests   Final    NONE Performed at [facility], [address] [place], [place] [address]    Culture >=100,000 COLONIES/mL ESCHERICHIA COLI (A)  Final   Report Status 05/20/2019 FINAL  Final   Organism ID, Bacteria ESCHERICHIA COLI (A)  Final      Susceptibility   Escherichia coli - MIC*    AMPICILLIN 8 SENSITIVE Sensitive     CEFAZOLIN <=4 SENSITIVE Sensitive     CEFTRIAXONE <=0.25 SENSITIVE Sensitive     CIPROFLOXACIN <=0.25 SENSITIVE Sensitive     GENTAMICIN <=1 SENSITIVE Sensitive     IMIPENEM <=0.25 SENSITIVE Sensitive     NITROFURANTOIN >=512 RESISTANT Resistant     TRIMETH/SULFA <=20 SENSITIVE Sensitive     AMPICILLIN/SULBACTAM 4 SENSITIVE Sensitive     PIP/TAZO <=4 SENSITIVE Sensitive     * >=100,000 COLONIES/mL ESCHERICHIA COLI  SARS CORONAVIRUS 2 (TAT 6-24 HRS) Nasopharyngeal Nasopharyngeal Swab     Status: None   Collection Time: 05/18/19  1:37 PM   Specimen: Nasopharyngeal Swab  Result Value Ref Range Status   SARS Coronavirus 2 NEGATIVE NEGATIVE Final    Comment: (NOTE) SARS-CoV-2 target nucleic acids are NOT DETECTED. The SARS-CoV-2 RNA is generally detectable in upper and lower respiratory specimens during the  acute phase of infection. Negative results do not preclude SARS-CoV-2 infection, do not rule out co-infections with other pathogens, and should not be used as the sole basis for treatment or other patient management decisions. Negative results must be combined with clinical observations, patient history, and epidemiological information. The expected result is Negative. Fact Sheet for Patients: HairSlick.no Fact Sheet for Healthcare Providers: quierodirigir.com This test is not yet approved or cleared by the [place] FDA and  has been authorized for detection and/or diagnosis of SARS-CoV-2 by FDA under an Emergency Use Authorization (EUA). This EUA will remain  in effect (meaning this test can be used)  for the duration of the COVID-19 declaration under Section 56 4(b)(1) of the Act, 21 U.S.C. section 360bbb-3(b)(1), unless the authorization is terminated or revoked sooner. Performed at [facility], [place] [address] [place], [place] [address]          Radiology Studies: No results found.      Scheduled Meds: . aspirin EC  81 mg Oral QHS  . B-complex with vitamin C  1 tablet Oral Daily  . calcium acetate  667 mg Oral BID  . calcium-vitamin D  1 tablet Oral Daily  . cholecalciferol  1,000 Units Oral Daily  . docusate sodium  100 mg Oral BID  . enoxaparin  40 mg Subcutaneous Q24H  . feeding supplement (ENSURE ENLIVE)  237 mL Oral BID BM  . ferrous [id number] C-folic acid  1 capsule Oral BID  . fluticasone furoate-vilanterol  1 puff Inhalation Daily  . loratadine  10 mg Oral Daily  . midodrine  5 mg Oral TID WC  . multivitamin with minerals  1 tablet Oral Daily  . nicotine  14 mg Transdermal Daily  . oxybutynin  5 mg Oral TID  . pantoprazole  40 mg Oral Daily  . polyethylene glycol  17 g Oral Daily  . pregabalin  100 mg Oral TID  . rosuvastatin  10 mg Oral QHS  . tiotropium  1 capsule Inhalation  Daily   Continuous Infusions: . cefTRIAXone (ROCEPHIN)  IV 1 g (05/20/19 1409)    Assessment & Plan:   Active Problems:   UTI (urinary tract infection)   1. UTI- pt is hypotensive, but I think is more from being on beta blk and dehydration. V.s. sepsis. mild leukocytosis, no tachycardia Urine culture with E. Coli-sensitivity returned, will continue with Rocephin  Continue blood cultures-work today covid negative Patient was on nitrofurantoin as outpatient for her recurrent UTI however this urine culture reveals resistance to this antibiotic did discuss this with the patient and her husband  2.Hypotension-sepsis v.s. dehyration/beta blk.  Has had this problem in the past during her hospitalization Symptoms resolved Pressures are improving.  Will increase midodrine to 10 mg 3 times daily  3.COPD (chronic obstructive pulmonary disease) (HCC)-not in exacerbation -Continue home bronchodilators, scheduled and as needed  4.Essential hypertension -hold home meds due to hypotension  5.  Tobacco abuse-patient is trying to quit smoking per her husband. Encouraged to continue to try quitting   DVT prophylaxis: lovenox Code Status:full  Family Communication:  Husband Disposition Plan:  Initially patient declined going back to [facility] however family insisted on her going back as they cannot take care of her and she needs rehab.  Now she is agreeable Barrier: Returning to [employer] when [employer] authorization authorizes.  Possibly Monday   LOS: 2 days   Time spent: 45 minutes with more than 50% COC    Nolberto Hanlon, MD [facility] Pager [phone]  If 7PM-7AM, please contact night-coverage [email or web address] Password [facility] 05/21/2019, 11:44 AM Patient ID: Holly Shelton, female   DOB: [birth year], 79 y.o.   MRN: [id number]

## 2019-05-22 DIAGNOSIS — R4182 Altered mental status, unspecified: Secondary | ICD-10-CM

## 2019-05-22 DIAGNOSIS — E861 Hypovolemia: Secondary | ICD-10-CM

## 2019-05-22 LAB — RESPIRATORY PANEL BY RT PCR (FLU A&B, COVID)
Influenza A by PCR: NEGATIVE
Influenza B by PCR: NEGATIVE
SARS Coronavirus 2 by RT PCR: NEGATIVE

## 2019-05-22 MED ORDER — FLEET ENEMA 7-19 GM/118ML RE ENEM
1.0000 | ENEMA | Freq: Once | RECTAL | Status: AC
  Administered 2019-05-22: 1 via RECTAL

## 2019-05-22 MED ORDER — ADULT MULTIVITAMIN W/MINERALS CH: 1.0000 | ORAL_TABLET | Freq: Every day | ORAL | Status: AC

## 2019-05-22 MED ORDER — POLYETHYLENE GLYCOL 3350 17 G PO PACK: 17.0000 g | PACK | Freq: Every day | ORAL | 0 refills | Status: AC

## 2019-05-22 MED ORDER — MIDODRINE HCL 10 MG PO TABS: 10.0000 mg | ORAL_TABLET | Freq: Three times a day (TID) | ORAL | Status: AC

## 2019-05-22 NOTE — Progress Notes (Addendum)
Physical Therapy Treatment Patient Details Name: Holly Shelton MRN: [id number] DOB: 1940/06/05 Today's Date: 05/22/2019    History of Present Illness Holly Shelton is a 79 y.o. female with medical history significant for HTN, COPD, GERD, AAA thoracic descending aorta, recurrent UTIs on nitrofurantoin and tobacco use who presents to the emergency room  From [facility] comm[facility] for change in mental status.  Per ER nursing note patient was febrile at 101.1. Per EMS patient 90% on room air and was placed on 3 L with improvement to 96%. Patient recently discharged from the hospital on 05/11/2019 after R subtrochanteric IM nail s/p fall.    PT Comments    Pt resting in bed upon PT arrival; pt's husband present most of session.  Mod to max assist x1 semi-supine to sitting edge of bed; unable to stand from elevated bed x5 attempts (up to RW) with one assist; and 2 assist sit to semi-supine in bed end of session.  Tolerated LE ex's sitting edge of bed fairly well.  Mobility limited d/t R hip pain, generalized weakness, and fatigue.  Will continue to focus on strengthening and progressive functional mobility per pt tolerance.   Follow Up Recommendations  SNF     Equipment Recommendations  Rolling walker with 5" wheels;3in1 (PT);Wheelchair (measurements PT);Wheelchair cushion (measurements PT);Hospital bed(hoyer lift)    Recommendations for Other Services OT consult     Precautions / Restrictions Precautions Precautions: Fall Restrictions Weight Bearing Restrictions: Yes RLE Weight Bearing: Weight bearing as tolerated    Mobility  Bed Mobility Overal bed mobility: Needs Assistance Bed Mobility: Supine to Sit;Sit to Supine     Supine to sit: Mod assist;Max assist;HOB elevated Sit to supine: +2 for physical assistance;HOB elevated   General bed mobility comments: assist for LE's and trunk; vc's for technique; 2 assist to boost pt up in bed end of session using bed sheet  Transfers Overall  transfer level: Needs assistance Equipment used: Rolling walker (2 wheeled) Transfers: Sit to/from Stand Sit to Stand: Total assist         General transfer comment: x5 trials total; unable to clear pt's bottom from bed 1st 2 trials; 3-5th trial therapist blocked pt's R knee and able to bring pt's bottom mildly off of bed but unable to clear bed; vc's for technique  Ambulation/Gait             General Gait Details: Not able to stand to assess at this time   Stairs             Wheelchair Mobility    Modified Rankin (Stroke Patients Only)       Balance Overall balance assessment: Needs assistance Sitting-balance support: Bilateral upper extremity supported;Feet supported Sitting balance-Leahy Scale: Poor Sitting balance - Comments: pt requiring cueing intermittent to sit up straight (pt appeared to fatigue in sitting) Postural control: Posterior lean;Right lateral lean                                  Cognition Arousal/Alertness: Awake/alert Behavior During Therapy: WFL for tasks assessed/performed Overall Cognitive Status: Within Functional Limits for tasks assessed                                        Exercises Total Joint Exercises Long Arc Quad: AROM;Left;AAROM;Right;Strengthening;10 reps;Seated General Exercises - Lower Extremity Hip Flexion/Marching:  AROM;Left;AAROM;Right;Strengthening;10 reps;Seated    General Comments   Nursing cleared pt for participation in physical therapy.  Pt agreeable to PT session.      Pertinent Vitals/Pain Pain Assessment: Faces Faces Pain Scale: Hurts a little bit(8/10 with activity) Pain Location: R hip/thigh Pain Descriptors / Indicators: Aching;Sore Pain Intervention(s): Limited activity within patient's tolerance;Monitored during session;Repositioned  Vitals (HR and on O2 on supplemental O2) stable and WFL throughout treatment session.    Home Living                       Prior Function            PT Goals (current goals can now be found in the care plan section) Acute Rehab PT Goals Patient Stated Goal: go home PT Goal Formulation: With patient/family Time For Goal Achievement: Jun 10, 2019 Potential to Achieve Goals: Fair Progress towards PT goals: Progressing toward goals    Frequency     7X/week      PT Plan Frequency needs to be updated(Per PT guidelines frequency updated to QD)    Co-evaluation              AM-PAC PT "6 Clicks" Mobility   Outcome Measure  Help needed turning from your back to your side while in a flat bed without using bedrails?: A Lot Help needed moving from lying on your back to sitting on the side of a flat bed without using bedrails?: A Lot Help needed moving to and from a bed to a chair (including a wheelchair)?: Total Help needed standing up from a chair using your arms (e.g., wheelchair or bedside chair)?: Total Help needed to walk in hospital room?: Total Help needed climbing 3-5 steps with a railing? : Total 6 Click Score: 8    End of Session Equipment Utilized During Treatment: Gait belt;Oxygen Activity Tolerance: Patient limited by fatigue Patient left: in bed;with call bell/phone within reach;with bed alarm set;with family/visitor present;Other (comment)(B heels floating via pillows) Nurse Communication: Mobility status;Precautions;Weight bearing status PT Visit Diagnosis: Unsteadiness on feet (R26.81);Muscle weakness (generalized) (M62.81);Pain;Other abnormalities of gait and mobility (R26.89);History of falling (Z91.81) Pain - Right/Left: Right Pain - part of body: Hip     Time: 1103-1594 PT Time Calculation (min) (ACUTE ONLY): 30 min  Charges:  $Therapeutic Activity: 23-37 mins                     Leitha Bleak, PT 05/22/19, 2:40 PM

## 2019-05-22 NOTE — Discharge Summary (Addendum)
Rob HickmanDeanna L Bradfield ZOX:[id number] DOB: 1940/05/17 DOA: 05/18/2019  PCP: Patient, No Pcp Per  Admit date: 05/18/2019 Discharge date: 05/23/19 [facility]  Recommendations for Outpatient Follow-up:  1. Follow up with PCP in 1 week 2. 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  Per ER nursing note patient was febrile at 101.1.  Per EMS facility stated her urine looked cloudy yesterday but denied any change in mental status then.  Per EMS patient 90% on room air and was placed on 3 L with improvement to 96%. Husband at bedside. Pt oriented, reports no abdominal pain, shortness of breath, fever, chills,, speech change.  Pt denied to me any neurologic sx including weakness.  Patient was just recently discharged from the hospital on 05/11/2019 when she was discharged after ORIF after status post fall.  Also treated for UTI and AKI.  Patient was found with urinary tract infection, E. coli.  Blood cultures were negative.  She was started on IV ceftriaxone which she completed the fifth dose today.  She was previously taking nitrofurantoin chronically for recurrent UTI however her urine culture was resistant to this antibiotic.  She was hypotensive and she was given IV fluids.  Her blood pressure remained on the low side which previously during her hospitalization and this has been an issue.  She was started on midodrine and increased to 10 mg 3 times daily with improvement of her low blood pressure.  She is stable to [facility].  Her mentation is well.  Husband has been updated.   1. UTI-pt is  hypotensive, but I think is more from being on beta blk and dehydration. V.s. sepsis. mildleukocytosis, no tachycardia Urine culture with E. Coli-sensitivity returned, will continue with Rocephin , 5 days completed today BCX ntd covid negative Patient was on nitrofurantoin as outpatient for her recurrent UTI however this urine culture reveals resistance to this antibiotic did discuss this with the patient and her husband  2.Hypotension-sepsis v.s. dehyration/beta blk.  Has had this problem in the past during her hospitalization Symptoms resolved Pressures are improving. Continue midodrine to 10 mg 3 times daily  3.COPD (chronic obstructive pulmonary disease) (HCC)-not in exacerbation -Continue home bronchodilators, scheduled and as needed  4.Essential hypertension -hold home meds due to hypotension  5.Tobacco abuse-patient is trying to quit smoking per her husband.Encouraged to continue to try quitting   Addendum: Pt had to stay overnight since insurance authorization was not authorized, but this morning we received authorization. No overnight issues. Will d/c pt today.  Discharge Diagnoses:  Active Problems:   UTI (urinary tract infection)    Discharge Instructions  Discharge Instructions    Call MD for:  temperature >100.4   Complete by: As directed    Diet - low sodium heart healthy   Complete by: As directed    Discharge instructions   Complete by: As directed    Follow-up with primary care in 1 week   Increase activity slowly   Complete by: As directed      Allergies as of 05/22/2019      Reactions   Penicillins Rash   Did it involve swelling of the face/tongue/throat, SOB, or  low BP? No Did it involve sudden or severe rash/hives, skin peeling, or any reaction on the inside of your mouth or nose? Yes Did you need to seek medical attention at a hospital or doctor's office? No When did it last happen? If all above answers are "NO", may proceed with  cephalosporin use.      Medication List    STOP taking these medications   aspirin buffered 325 MG Tabs tablet Commonly known as: BUFFERIN   fluconazole 200 MG tablet Commonly known as: DIFLUCAN   HYDROcodone-acetaminophen 5-325 MG tablet Commonly known as: NORCO/VICODIN   nitrofurantoin 50 MG capsule Commonly known as: MACRODANTIN   propranolol 20 MG tablet Commonly known as: INDERAL   traMADol 50 MG tablet Commonly known as: ULTRAM     TAKE these medications   albuterol 108 (90 Base) MCG/ACT inhaler Commonly known as: VENTOLIN HFA Inhale 2 puffs into the lungs every 6 (six) hours as needed.   alum & mag hydroxide-simeth 200-200-20 MG/5ML suspension Commonly known as: MAALOX/MYLANTA Take 30 mLs by mouth every 4 (four) hours as needed for indigestion.   aspirin EC 81 MG tablet Take 81 mg by mouth at bedtime.   B-complex with vitamin C tablet Take 1 tablet by mouth daily.   bisacodyl 10 MG suppository Commonly known as: DULCOLAX Place 1 suppository (10 mg total) rectally daily as needed for moderate constipation.   Calcium 600-200 MG-UNIT tablet Take 1 tablet by mouth daily.   calcium acetate (Phos Binder) 667 MG/5ML Soln Commonly known as: PHOSLYRA Take 667 mg by mouth in the morning and at bedtime.   cetirizine 10 MG tablet Commonly known as: ZYRTEC Take 10 mg by mouth daily.   cholecalciferol 25 MCG (1000 UNIT) tablet Commonly known as: VITAMIN D3 Take 1,000 Units by mouth daily.   docusate sodium 100 MG capsule Commonly known as: COLACE Take 1 capsule (100 mg total) by mouth 2 (two) times daily.   enoxaparin 40 MG/0.4ML injection Commonly known as: LOVENOX Inject 0.4 mLs (40 mg total) into the skin daily for 14 days.   feeding supplement (ENSURE ENLIVE) Liqd Take 237 mLs by mouth 2 (two) times daily between meals.   ferrous fumarate-b12-vitamic C-folic acid capsule Commonly known as: TRINSICON / FOLTRIN Take 1 capsule by mouth 2 (two) times  daily.   midodrine 10 MG tablet Commonly known as: PROAMATINE Take 1 tablet (10 mg total) by mouth 3 (three) times daily with meals.   multivitamin with minerals Tabs tablet Take 1 tablet by mouth daily. Start taking on: May 23, 2019   nicotine 14 mg/24hr patch Commonly known as: NICODERM CQ - dosed in mg/24 hours Place 1 patch (14 mg total) onto the skin daily.   omeprazole 40 MG capsule Commonly known as: PRILOSEC Take 40 mg by mouth daily.   oxybutynin 5 MG tablet Commonly known as: DITROPAN Take 5 mg by mouth 3 (three) times daily.   polyethylene glycol 17 g packet Commonly known as: MIRALAX / GLYCOLAX Take 17 g by mouth daily. Start taking on: May 23, 2019   pregabalin 100 MG capsule Commonly known as: LYRICA Take 100 mg by mouth 3 (three) times daily.   rosuvastatin 10 MG tablet Commonly known as: CRESTOR Take 10 mg by mouth at bedtime.   Spiriva HandiHaler 18 MCG inhalation capsule Generic drug: tiotropium Place 1 capsule into inhaler and inhale daily.   Wixela Inhub 500-50 MCG/DOSE Aepb Generic drug: Fluticasone-Salmeterol Inhale 1 puff into the lungs 2 (two) times  daily.       Allergies  Allergen Reactions  . Penicillins Rash    Did it involve swelling of the face/tongue/throat, SOB, or low BP? No Did it involve sudden or severe rash/hives, skin peeling, or any reaction on the inside of your mouth or nose? Yes Did you need to seek medical attention at a hospital or doctor's office? No When did it last happen? If all above answers are "NO", may proceed with cephalosporin use.    Consultations:  None   Procedures/Studies: DG Chest 1 View  Result Date: 05/06/2019 CLINICAL DATA:  Recent fall with chest pain, initial encounter EXAM: CHEST  1 VIEW COMPARISON:  05/19/2013 FINDINGS: Cardiac shadow is enlarged. Thoracic aorta is tortuous and accentuated by mild patient rotation. The lungs are clear bilaterally. No acute bony abnormality is  seen. IMPRESSION: Cardiomegaly and tortuous aorta.  No acute abnormality is seen. Electronically Signed   By: Alcide Clever M.D.   On: 05/06/2019 19:03   DG Chest Portable 1 View  Result Date: 05/18/2019 CLINICAL DATA:  Sepsis. EXAM: PORTABLE CHEST 1 VIEW COMPARISON:  May 06, 2019. FINDINGS: Stable cardiomegaly. No pneumothorax or pleural effusion is noted. Left lung is clear. Minimal right basilar subsegmental atelectasis is noted. Bony thorax is unremarkable. IMPRESSION: Minimal right basilar subsegmental atelectasis. Electronically Signed   By: Lupita Raider M.D.   On: 05/18/2019 12:28   ECHOCARDIOGRAM COMPLETE  Result Date: 05/08/2019    ECHOCARDIOGRAM REPORT   Patient Name:   Holly Shelton Date of Exam: 05/08/2019 Medical Rec #:  [id number]      Height:       60.0 in Accession #:    [id number]     Weight:       156.5 lb Date of Birth:  02-27-1940      BSA:          1.682 m Patient Age:    78 years       BP:           87/57 mmHg Patient Gender: F              HR:           74 bpm. Exam Location:  [facility] Procedure: 2D Echo, Color Doppler and Cardiac Doppler Indications:     R07.9 Chest Pain  History:         Patient has no prior history of Echocardiogram examinations.                  Risk Factors:Hypertension.  Sonographer:     Humphrey Rolls RDCS (AE) Referring Phys:  [id number] [facility] Diagnosing Phys: Lorine Bears MD  Sonographer Comments: Technically difficult study due to poor echo windows. TDS due to inability to position pt due to broken hip. IMPRESSIONS  1. Left ventricular ejection fraction, by estimation, is 55 to 60%. The left ventricle has normal function. Left ventricular endocardial border not optimally defined to evaluate regional wall motion. There is mild left ventricular hypertrophy. Left ventricular diastolic parameters are consistent with Grade I diastolic dysfunction (impaired relaxation).  2. Right ventricular systolic function is normal. The right ventricular size is normal. Tricuspid  regurgitation signal is inadequate for assessing PA pressure.  3. The mitral valve is normal in structure. No evidence of mitral valve regurgitation. No evidence of mitral stenosis.  4. The aortic valve is normal in structure. Aortic valve regurgitation is not visualized. No aortic stenosis is present.  5. The inferior vena cava is  normal in size with greater than 50% respiratory variability, suggesting right atrial pressure of 3 mmHg. FINDINGS  Left Ventricle: Left ventricular ejection fraction, by estimation, is 55 to 60%. The left ventricle has normal function. Left ventricular endocardial border not optimally defined to evaluate regional wall motion. The left ventricular internal cavity size was normal in size. There is mild left ventricular hypertrophy. Left ventricular diastolic parameters are consistent with Grade I diastolic dysfunction (impaired relaxation). Right Ventricle: The right ventricular size is normal. No increase in right ventricular wall thickness. Right ventricular systolic function is normal. Tricuspid regurgitation signal is inadequate for assessing PA pressure. Left Atrium: Left atrial size was normal in size. Right Atrium: Right atrial size was normal in size. Pericardium: There is no evidence of pericardial effusion. Mitral Valve: The mitral valve is normal in structure. Normal mobility of the mitral valve leaflets. No evidence of mitral valve regurgitation. No evidence of mitral valve stenosis. MV peak gradient, 3.2 mmHg. The mean mitral valve gradient is 1.0 mmHg. Tricuspid Valve: The tricuspid valve is normal in structure. Tricuspid valve regurgitation is trivial. No evidence of tricuspid stenosis. Aortic Valve: The aortic valve is normal in structure. Aortic valve regurgitation is not visualized. No aortic stenosis is present. Aortic valve mean gradient measures 4.0 mmHg. Aortic valve peak gradient measures 9.5 mmHg. Aortic valve area, by VTI measures 3.58 cm. Pulmonic Valve: The  pulmonic valve was normal in structure. Pulmonic valve regurgitation is not visualized. No evidence of pulmonic stenosis. Aorta: The aortic root is normal in size and structure. Venous: The inferior vena cava is normal in size with greater than 50% respiratory variability, suggesting right atrial pressure of 3 mmHg. IAS/Shunts: No atrial level shunt detected by color flow Doppler.  LEFT VENTRICLE PLAX 2D LVIDd:         3.35 cm  Diastology LVIDs:         2.08 cm  LV e' lateral:   6.09 cm/s LV PW:         0.83 cm  LV E/e' lateral: 8.9 LV IVS:        0.67 cm  LV e' medial:    6.42 cm/s LVOT diam:     2.40 cm  LV E/e' medial:  8.5 LV SV:         77 LV SV Index:   46 LVOT Area:     4.52 cm  LEFT ATRIUM           Index LA diam:      4.10 cm 2.44 cm/m LA Vol (A4C): 49.7 ml 29.55 ml/m  AORTIC VALVE                   PULMONIC VALVE AV Area (Vmax):    2.80 cm    PV Vmax:       1.05 m/s AV Area (Vmean):   3.42 cm    PV Vmean:      65.200 cm/s AV Area (VTI):     3.58 cm    PV VTI:        0.176 m AV Vmax:           154.00 cm/s PV Peak grad:  4.4 mmHg AV Vmean:          92.100 cm/s PV Mean grad:  2.0 mmHg AV VTI:            0.216 m AV Peak Grad:      9.5 mmHg AV Mean Grad:  4.0 mmHg LVOT Vmax:         95.30 cm/s LVOT Vmean:        69.600 cm/s LVOT VTI:          0.171 m LVOT/AV VTI ratio: 0.79  AORTA Ao Root diam: 3.40 cm MITRAL VALVE MV Area (PHT): 4.68 cm    SHUNTS MV Peak grad:  3.2 mmHg    Systemic VTI:  0.17 m MV Mean grad:  1.0 mmHg    Systemic Diam: 2.40 cm MV Vmax:       0.89 m/s MV Vmean:      41.3 cm/s MV Decel Time: 162 msec MV E velocity: 54.30 cm/s MV A velocity: 79.50 cm/s MV E/A ratio:  0.68 Lorine Bears MD Electronically signed by Lorine Bears MD Signature Date/Time: 05/08/2019/4:50:52 PM    Final    DG HIP PORT UNILAT WITH PELVIS 1V RIGHT  Result Date: 05/09/2019 CLINICAL DATA:  RIGHT hip pain, hip surgery on 05/07/2019 EXAM: DG HIP (WITH OR WITHOUT PELVIS) 1V PORT RIGHT COMPARISON:  05/07/2019  FINDINGS: IM nail with compression screw at proximal RIGHT femur post ORIF. Bones demineralized. No additional fracture, dislocation, or bone destruction. Degenerative disc and facet disease changes of visualized lower lumbar spine. IMPRESSION: Osseous demineralization post ORIF of proximal RIGHT femoral fracture. No new abnormalities or significant change since 05/07/2019. Electronically Signed   By: Ulyses Southward M.D.   On: 05/09/2019 09:57   DG HIP OPERATIVE UNILAT W OR W/O PELVIS RIGHT  Result Date: 05/07/2019 CLINICAL DATA:  Fracture fixation. Subtrochanteric fracture of the right hip. EXAM: OPERATIVE right HIP (WITH PELVIS IF PERFORMED) 6 VIEWS TECHNIQUE: Fluoroscopic spot image(s) were submitted for interpretation post-operatively. COMPARISON:  Radiographs 05/06/2019 FINDINGS: Multiple intraoperative fluoroscopic spot images demonstrate placement of a long intramedullary gamma nail and the proximal dynamic hip screw and single distal interlocking screw. Good position and alignment of the fracture with anatomic reduction. IMPRESSION: Anatomic reduction of the right hip fracture with internal fixation. Electronically Signed   By: Rudie Meyer M.D.   On: 05/07/2019 12:45   DG Hip Unilat W or Wo Pelvis 2-3 Views Right  Result Date: 05/06/2019 CLINICAL DATA:  Right hip pain post fall. EXAM: DG HIP (WITH OR WITHOUT PELVIS) 2-3V RIGHT COMPARISON:  None. FINDINGS: There is a transverse severely displaced and impacted subtrochanteric fracture of the proximal right femoral shaft. The right femoral head is normally located within the acetabulum. IMPRESSION: Transverse severely displaced and impacted subtrochanteric fracture of the proximal right femoral shaft. Electronically Signed   By: Ted Mcalpine M.D.   On: 05/06/2019 19:06   DG ESOPHAGUS W SINGLE CM (SOL OR THIN BA)  Result Date: 05/10/2019 CLINICAL DATA:  Difficulty swallowing with pain EXAM: ESOPHOGRAM/BARIUM SWALLOW TECHNIQUE: Single contrast  examination was performed using  thin barium. FLUOROSCOPY TIME:  Fluoroscopy Time:  2 minutes Radiation Exposure Index (if provided by the fluoroscopic device): 26 mGy Number of Acquired Spot Images: Multiple cine fluoroscopic runs COMPARISON:  None. FINDINGS: Swallowing mechanism is within normal limits. There is normal esophageal transit proximally. No definitive mucosal abnormality is seen. The degree of motility in the mid to distal esophagus however is significantly decreased. Some extrinsic compression upon the distal esophagus is noted which may be related to the distal descending thoracic aorta or enlarged left atrium. This somewhat limits the passage of contrast material into the stomach although a true mechanical obstruction is not present. IMPRESSION: Decreased motility in the mid to distal esophagus. Some extrinsic compression  upon the distal esophagus is noted likely related to prominent left atrium or the ectatic aorta. Electronically Signed   By: Inez Catalina M.D.   On: 05/10/2019 16:26       Subjective: Has no complaints.  Discharge Exam: Vitals:   05/22/19 0739 05/22/19 0742  BP: (!) 88/53 111/65  Pulse: 89 88  Resp:    Temp: 98.1 F (36.7 C)   SpO2: 99%    Vitals:   05/21/19 1710 05/21/19 2321 05/22/19 0739 05/22/19 0742  BP: 110/61 114/64 (!) 88/53 111/65  Pulse: 84 88 89 88  Resp: 20 20    Temp: 98.3 F (36.8 C) 98.3 F (36.8 C) 98.1 F (36.7 C)   TempSrc: Axillary Oral Oral   SpO2: 97% 99% 99%   Weight:      Height:        General: Pt is alert, awake, not in acute distress Cardiovascular: RRR, S1/S2 +, no rubs, no gallops Respiratory: CTA bilaterally, no wheezing, no rhonchi Abdominal: Soft, NT, ND, bowel sounds + Extremities: no edema, no cyanosis    The results of significant diagnostics from this hospitalization (including imaging, microbiology, ancillary and laboratory) are listed below for reference.     Microbiology: Recent Results (from the  past 240 hour(s))  Culture, blood (routine x 2)     Status: None (Preliminary result)   Collection Time: 05/18/19 12:00 PM   Specimen: BLOOD  Result Value Ref Range Status   Specimen Description BLOOD BLOOD RIGHT HAND  Final   Special Requests   Final    BOTTLES DRAWN AEROBIC AND ANAEROBIC Blood Culture adequate volume   Culture   Final    NO GROWTH 4 DAYS Performed at [facility], [address], [place], [place] [address]    Report Status PENDING  Incomplete  Culture, blood (routine x 2)     Status: None (Preliminary result)   Collection Time: 05/18/19 12:01 PM   Specimen: BLOOD  Result Value Ref Range Status   Specimen Description BLOOD LEFT ANTECUBITAL  Final   Special Requests   Final    BOTTLES DRAWN AEROBIC AND ANAEROBIC Blood Culture adequate volume   Culture   Final    NO GROWTH 4 DAYS Performed at [facility], [address], [place], [place] [address]    Report Status PENDING  Incomplete  Urine culture     Status: Abnormal   Collection Time: 05/18/19 12:02 PM   Specimen: Urine, Random  Result Value Ref Range Status   Specimen Description   Final    URINE, RANDOM Performed at [facility], [address] [place], [place] [address]    Special Requests   Final    NONE Performed at [facility], [address] [place], [place] [address]    Culture >=100,000 COLONIES/mL ESCHERICHIA COLI (A)  Final   Report Status 05/20/2019 FINAL  Final   Organism ID, Bacteria ESCHERICHIA COLI (A)  Final      Susceptibility   Escherichia coli - MIC*    AMPICILLIN 8 SENSITIVE Sensitive     CEFAZOLIN <=4 SENSITIVE Sensitive     CEFTRIAXONE <=0.25 SENSITIVE Sensitive     CIPROFLOXACIN <=0.25 SENSITIVE Sensitive     GENTAMICIN <=1 SENSITIVE Sensitive     IMIPENEM <=0.25 SENSITIVE Sensitive     NITROFURANTOIN >=512 RESISTANT Resistant     TRIMETH/SULFA <=20 SENSITIVE Sensitive     AMPICILLIN/SULBACTAM 4 SENSITIVE Sensitive     PIP/TAZO  <=4 SENSITIVE Sensitive     * >=  100,000 COLONIES/mL ESCHERICHIA COLI  SARS CORONAVIRUS 2 (TAT 6-24 HRS) Nasopharyngeal Nasopharyngeal Swab     Status: None   Collection Time: 05/18/19  1:37 PM   Specimen: Nasopharyngeal Swab  Result Value Ref Range Status   SARS Coronavirus 2 NEGATIVE NEGATIVE Final    Comment: (NOTE) SARS-CoV-2 target nucleic acids are NOT DETECTED. The SARS-CoV-2 RNA is generally detectable in upper and lower respiratory specimens during the acute phase of infection. Negative results do not preclude SARS-CoV-2 infection, do not rule out co-infections with other pathogens, and should not be used as the sole basis for treatment or other patient management decisions. Negative results must be combined with clinical observations, patient history, and epidemiological information. The expected result is Negative. Fact Sheet for Patients: HairSlick.no Fact Sheet for Healthcare Providers: quierodirigir.com This test is not yet approved or cleared by the [place] FDA and  has been authorized for detection and/or diagnosis of SARS-CoV-2 by FDA under an Emergency Use Authorization (EUA). This EUA will remain  in effect (meaning this test can be used) for the duration of the COVID-19 declaration under Section 56 4(b)(1) of the Act, 21 U.S.C. section 360bbb-3(b)(1), unless the authorization is terminated or revoked sooner. Performed at [facility], [address], [place], [place] [address]   Respiratory Panel by RT PCR (Flu A&B, Covid) - Nasopharyngeal Swab     Status: None   Collection Time: 05/22/19 10:38 AM   Specimen: Nasopharyngeal Swab  Result Value Ref Range Status   SARS Coronavirus 2 by RT PCR NEGATIVE NEGATIVE Final    Comment: (NOTE) SARS-CoV-2 target nucleic acids are NOT DETECTED. The SARS-CoV-2 RNA is generally detectable in upper respiratoy specimens during the acute phase of infection. The  lowest concentration of SARS-CoV-2 viral copies this assay can detect is 131 copies/mL. A negative result does not preclude SARS-Cov-2 infection and should not be used as the sole basis for treatment or other patient management decisions. A negative result may occur with  improper specimen collection/handling, submission of specimen other than nasopharyngeal swab, presence of viral mutation(s) within the areas targeted by this assay, and inadequate number of viral copies (<131 copies/mL). A negative result must be combined with clinical observations, patient history, and epidemiological information. The expected result is Negative. Fact Sheet for Patients:  https://www.moore.com/ Fact Sheet for Healthcare Providers:  https://www.young.biz/ This test is not yet ap proved or cleared by the [place] FDA and  has been authorized for detection and/or diagnosis of SARS-CoV-2 by FDA under an Emergency Use Authorization (EUA). This EUA will remain  in effect (meaning this test can be used) for the duration of the COVID-19 declaration under Section 564(b)(1) of the Act, 21 U.S.C. section 360bbb-3(b)(1), unless the authorization is terminated or revoked sooner.    Influenza A by PCR NEGATIVE NEGATIVE Final   Influenza B by PCR NEGATIVE NEGATIVE Final    Comment: (NOTE) The Xpert Xpress SARS-CoV-2/FLU/RSV assay is intended as an aid in  the diagnosis of influenza from Nasopharyngeal swab specimens and  should not be used as a sole basis for treatment. Nasal washings and  aspirates are unacceptable for Xpert Xpress SARS-CoV-2/FLU/RSV  testing. Fact Sheet for Patients: https://www.moore.com/ Fact Sheet for Healthcare Providers: https://www.young.biz/ This test is not yet approved or cleared by the [place] FDA and  has been authorized for detection and/or diagnosis of SARS-CoV-2 by  FDA under an Emergency  Use Authorization (EUA). This EUA will remain  in effect (meaning this test can  be used) for the duration of the  Covid-19 declaration under Section 564(b)(1) of the Act, 21  U.S.C. section 360bbb-3(b)(1), unless the authorization is  terminated or revoked. Performed at [facility], [address] [place], [place] [address]      Labs: BNP (last 3 results) No results for input(s): BNP in the last 8760 hours. Basic Metabolic Panel: Recent Labs  Lab 05/18/19 1200 05/19/19 0455 05/21/19 1219  NA 137 136 135  K 3.5 3.8 3.8  CL 102 101 100  CO2 GLUCOSE 85 80 96  BUN CREATININE 0.95 0.82 0.67  CALCIUM 9.7 9.5 9.7   Liver Function Tests: Recent Labs  Lab 05/18/19 1200 05/19/19 0455 05/21/19 1219  AST 29 33 30  ALT ALKPHOS 548* 495* 488*  BILITOT 1.3* 0.9 1.1  PROT 5.3* 5.2* 5.1*  ALBUMIN 2.0* 1.9* 1.9*   No results for input(s): LIPASE, AMYLASE in the last 168 hours. No results for input(s): AMMONIA in the last 168 hours. CBC: Recent Labs  Lab 05/18/19 1200 05/20/19 0508  WBC 11.7* 10.3  NEUTROABS 8.4*  --   HGB 8.3* 7.9*  HCT 26.2* 24.9*  MCV 95.3 96.1  PLT 483* 436*   Cardiac Enzymes: No results for input(s): CKTOTAL, CKMB, CKMBINDEX, TROPONINI in the last 168 hours. BNP: Invalid input(s): POCBNP CBG: No results for input(s): GLUCAP in the last 168 hours. D-Dimer No results for input(s): DDIMER in the last 72 hours. Hgb A1c No results for input(s): HGBA1C in the last 72 hours. Lipid Profile No results for input(s): CHOL, HDL, LDLCALC, TRIG, CHOLHDL, LDLDIRECT in the last 72 hours. Thyroid function studies No results for input(s): TSH, T4TOTAL, T3FREE, THYROIDAB in the last 72 hours.  Invalid input(s): FREET3 Anemia work up No results for input(s): VITAMINB12, FOLATE, FERRITIN, TIBC, IRON, RETICCTPCT in the last 72 hours. Urinalysis    Component Value Date/Time   COLORURINE YELLOW (A) 05/18/2019 1202    APPEARANCEUR TURBID (A) 05/18/2019 1202   APPEARANCEUR Cloudy 05/19/2013 1200   LABSPEC 1.016 05/18/2019 1202   LABSPEC 1.015 05/19/2013 1200   PHURINE 5.0 05/18/2019 1202   GLUCOSEU NEGATIVE 05/18/2019 1202   GLUCOSEU Negative 05/19/2013 1200   HGBUR LARGE (A) 05/18/2019 1202   BILIRUBINUR NEGATIVE 05/18/2019 1202   BILIRUBINUR Negative 05/19/2013 1200   KETONESUR 20 (A) 05/18/2019 1202   PROTEINUR 100 (A) 05/18/2019 1202   NITRITE POSITIVE (A) 05/18/2019 1202   LEUKOCYTESUR LARGE (A) 05/18/2019 1202   LEUKOCYTESUR 2+ 05/19/2013 1200   Sepsis Labs Invalid input(s): PROCALCITONIN,  WBC,  LACTICIDVEN Microbiology Recent Results (from the past 240 hour(s))  Culture, blood (routine x 2)     Status: None (Preliminary result)   Collection Time: 05/18/19 12:00 PM   Specimen: BLOOD  Result Value Ref Range Status   Specimen Description BLOOD BLOOD RIGHT HAND  Final   Special Requests   Final    BOTTLES DRAWN AEROBIC AND ANAEROBIC Blood Culture adequate volume   Culture   Final    NO GROWTH 4 DAYS Performed at [facility], [address] [place], [place] [address]    Report Status PENDING  Incomplete  Culture, blood (routine x 2)     Status: None (Preliminary result)   Collection Time: 05/18/19 12:01 PM   Specimen: BLOOD  Result Value Ref Range Status   Specimen Description BLOOD LEFT ANTECUBITAL  Final   Special Requests   Final  BOTTLES DRAWN AEROBIC AND ANAEROBIC Blood Culture adequate volume   Culture   Final    NO GROWTH 4 DAYS Performed at [facility], [address] [place], [place] [address]    Report Status PENDING  Incomplete  Urine culture     Status: Abnormal   Collection Time: 05/18/19 12:02 PM   Specimen: Urine, Random  Result Value Ref Range Status   Specimen Description   Final    URINE, RANDOM Performed at [facility], [address] [place], [place] [address]    Special Requests   Final    NONE Performed at [facility], [address] [place], [place] [address]    Culture >=100,000 COLONIES/mL ESCHERICHIA COLI (A)  Final   Report Status 05/20/2019 FINAL  Final   Organism ID, Bacteria ESCHERICHIA COLI (A)  Final      Susceptibility   Escherichia coli - MIC*    AMPICILLIN 8 SENSITIVE Sensitive     CEFAZOLIN <=4 SENSITIVE Sensitive     CEFTRIAXONE <=0.25 SENSITIVE Sensitive     CIPROFLOXACIN <=0.25 SENSITIVE Sensitive     GENTAMICIN <=1 SENSITIVE Sensitive     IMIPENEM <=0.25 SENSITIVE Sensitive     NITROFURANTOIN >=512 RESISTANT Resistant     TRIMETH/SULFA <=20 SENSITIVE Sensitive     AMPICILLIN/SULBACTAM 4 SENSITIVE Sensitive     PIP/TAZO <=4 SENSITIVE Sensitive     * >=100,000 COLONIES/mL ESCHERICHIA COLI  SARS CORONAVIRUS 2 (TAT 6-24 HRS) Nasopharyngeal Nasopharyngeal Swab     Status: None   Collection Time: 05/18/19  1:37 PM   Specimen: Nasopharyngeal Swab  Result Value Ref Range Status   SARS Coronavirus 2 NEGATIVE NEGATIVE Final    Comment: (NOTE) SARS-CoV-2 target nucleic acids are NOT DETECTED. The SARS-CoV-2 RNA is generally detectable in upper and lower respiratory specimens during the acute phase of infection. Negative results do not preclude SARS-CoV-2 infection, do not rule out co-infections with other pathogens, and should not be used as the sole basis for treatment or other patient management decisions. Negative results must be combined with clinical observations, patient history, and epidemiological information. The expected result is Negative. Fact Sheet for Patients: HairSlick.no Fact Sheet for Healthcare Providers: quierodirigir.com This test is not yet approved or cleared by the [place] FDA and  has been authorized for detection and/or diagnosis of SARS-CoV-2 by FDA under an Emergency Use Authorization (EUA). This EUA will remain  in effect (meaning this test can be used) for the duration of  the COVID-19 declaration under Section 56 4(b)(1) of the Act, 21 U.S.C. section 360bbb-3(b)(1), unless the authorization is terminated or revoked sooner. Performed at [facility], [address] [place], [place] [address]   Respiratory Panel by RT PCR (Flu A&B, Covid) - Nasopharyngeal Swab     Status: None   Collection Time: 05/22/19 10:38 AM   Specimen: Nasopharyngeal Swab  Result Value Ref Range Status   SARS Coronavirus 2 by RT PCR NEGATIVE NEGATIVE Final    Comment: (NOTE) SARS-CoV-2 target nucleic acids are NOT DETECTED. The SARS-CoV-2 RNA is generally detectable in upper respiratoy specimens during the acute phase of infection. The lowest concentration of SARS-CoV-2 viral copies this assay can detect is 131 copies/mL. A negative result does not preclude SARS-Cov-2 infection and should not be used as the sole basis for treatment or other patient management decisions. A negative result may occur with  improper specimen collection/handling, submission of specimen other than nasopharyngeal swab, presence of viral mutation(s)  within the areas targeted by this assay, and inadequate number of viral copies (<131 copies/mL). A negative result must be combined with clinical observations, patient history, and epidemiological information. The expected result is Negative. Fact Sheet for Patients:  https://www.moore.com/ Fact Sheet for Healthcare Providers:  https://www.young.biz/ This test is not yet ap proved or cleared by the [place] FDA and  has been authorized for detection and/or diagnosis of SARS-CoV-2 by FDA under an Emergency Use Authorization (EUA). This EUA will remain  in effect (meaning this test can be used) for the duration of the COVID-19 declaration under Section 564(b)(1) of the Act, 21 U.S.C. section 360bbb-3(b)(1), unless the authorization is terminated or revoked sooner.    Influenza A by PCR NEGATIVE NEGATIVE  Final   Influenza B by PCR NEGATIVE NEGATIVE Final    Comment: (NOTE) The Xpert Xpress SARS-CoV-2/FLU/RSV assay is intended as an aid in  the diagnosis of influenza from Nasopharyngeal swab specimens and  should not be used as a sole basis for treatment. Nasal washings and  aspirates are unacceptable for Xpert Xpress SARS-CoV-2/FLU/RSV  testing. Fact Sheet for Patients: https://www.moore.com/ Fact Sheet for Healthcare Providers: https://www.young.biz/ This test is not yet approved or cleared by the [place] FDA and  has been authorized for detection and/or diagnosis of SARS-CoV-2 by  FDA under an Emergency Use Authorization (EUA). This EUA will remain  in effect (meaning this test can be used) for the duration of the  Covid-19 declaration under Section 564(b)(1) of the Act, 21  U.S.C. section 360bbb-3(b)(1), unless the authorization is  terminated or revoked. Performed at [facility], [address], [place], [place] [address]      Time coordinating discharge: Over 30 minutes  SIGNED:   Lynn Ito, MD  [facility] 05/22/2019, 12:32 PM Pager   If 7PM-7AM, please contact night-coverage [email or web address] Password [email or web address]

## 2019-05-22 NOTE — Care Management Important Message (Signed)
Important Message  Patient Details  Name: Holly Shelton MRN: [id number] Date of Birth: 1940/05/19   Medicare Important Message Given:  Yes     Olegario Messier A Donevan Biller 05/22/2019, 11:08 AM

## 2019-05-22 NOTE — TOC Progression Note (Signed)
Transition of Care [facility]) - Progression Note    Patient Details  Name: Holly Shelton MRN: [id number] Date of Birth: [birth year]  Transition of Care [facility]) CM/SW Contact  Barrie Dunker, RN Phone Number: 05/22/2019, 9:55 AM  Clinical Narrative:    [name] out to [name] at [facility] to inquire about insurance auth, she stated it was requested yesterday and she hopes to get it today.  She will notify me, I notified the physician.   Expected Discharge Plan: Home w Home Health Services Barriers to Discharge: Continued Medical Work up  Expected Discharge Plan and Services Expected Discharge Plan: Home w Home Health Services   Discharge Planning Services: CM Consult   Living arrangements for the past 2 months: Single Family Home                 DME Arranged: N/A         HH Arranged: RN, PT, OT, Nurse's Aide HH [facility]: [facility] Date HH Agency Contacted: 05/19/19 Time HH Agency Contacted: [phone] Representative spoke with at [facility] Agency: Rosey Bath   Social Determinants of Health (SDOH) Interventions    Readmission Risk Interventions No flowsheet data found.

## 2019-05-22 NOTE — TOC Progression Note (Signed)
Transition of Care [facility]) - Progression Note    Patient Details  Name: Holly Shelton MRN: [id number] Date of Birth: 05/12/1940  Transition of Care [facility]) CM/SW Contact  Barrie Dunker, RN Phone Number: 05/22/2019, 2:17 PM  Clinical Narrative:    [name] out to [name] at [facility] to inquire about insurance auth, she has not received it yet but will call to follow up, DC information sent thru the Hub   Expected Discharge Plan: Home w Home Health Services Barriers to Discharge: Continued Medical Work up  Expected Discharge Plan and Services Expected Discharge Plan: Home w Home Health Services   Discharge Planning Services: CM Consult   Living arrangements for the past 2 months: Single Family Home Expected Discharge Date: 05/22/19               DME Arranged: N/A         HH Arranged: RN, PT, OT, Nurse's Aide [facility] Date HH Agency Contacted: 05/19/19 Time HH Agency Contacted: [phone] Representative spoke with at [facility] Agency: Rosey Bath   Social Determinants of Health (SDOH) Interventions    Readmission Risk Interventions No flowsheet data found.

## 2019-05-23 LAB — CULTURE, BLOOD (ROUTINE X 2)
Culture: NO GROWTH
Culture: NO GROWTH
Special Requests: ADEQUATE
Special Requests: ADEQUATE

## 2019-05-23 NOTE — TOC Transition Note (Signed)
Transition of Care [facility]) - CM/SW Discharge Note   Patient Details  Name: Holly Shelton MRN: [id number] Date of Birth: [birth year]  Transition of Care [facility]) CM/SW Contact:  Barrie Dunker, RN Phone Number: 05/23/2019, 10:36 AM   Clinical Narrative:     The patient is to DC to [facility] today via EMS transport, DC packet on the chart the bedside nurse has called report, RNCM called EMS for transport, the husband made aware  Final next level of care: Skilled Nursing Facility Barriers to Discharge: Barriers Resolved   Patient Goals and CMS Choice Patient states their goals for this hospitalization and ongoing recovery are:: go home      Discharge Placement              Patient chooses bed at: [facility] Patient to be transferred to facility by: EMS Name of family member notified: Mikey Bussing Patient and family notified of of transfer: 05/23/19  Discharge Plan and Services   Discharge Planning Services: CM Consult            DME Arranged: N/A         HH Arranged: RN, PT, OT, Nurse's Aide [facility] Date HH Agency Contacted: 05/19/19 Time HH Agency Contacted: [phone] Representative spoke with at [facility] Agency: Rosey Bath  Social Determinants of Health (SDOH) Interventions     Readmission Risk Interventions No flowsheet data found.

## 2019-05-23 NOTE — Plan of Care (Addendum)
Entered in error

## 2019-05-23 NOTE — TOC Progression Note (Signed)
Transition of Care [facility]) - Progression Note    Patient Details  Name: Holly Shelton MRN: [id number] Date of Birth: [birth year]  Transition of Care [facility]) CM/SW Contact  Barrie Dunker, RN Phone Number: 05/23/2019, 9:05 AM  Clinical Narrative:    Received a call from [name] at [facility], they have received auth from [employer], the patient will DC today to [facility]   Expected Discharge Plan: Home w Home Health Services Barriers to Discharge: Continued Medical Work up  Expected Discharge Plan and Services Expected Discharge Plan: Home w Home Health Services   Discharge Planning Services: CM Consult   Living arrangements for the past 2 months: Single Family Home Expected Discharge Date: 05/23/19               DME Arranged: N/A         HH Arranged: RN, PT, OT, Nurse's Aide HH [facility] Date HH Agency Contacted: 05/19/19 Time HH Agency Contacted: [phone] Representative spoke with at [facility] Agency: Rosey Bath   Social Determinants of Health (SDOH) Interventions    Readmission Risk Interventions No flowsheet data found.

## 2019-05-23 NOTE — Plan of Care (Signed)
Patient discharged to [facility] per MD order. Report called to [employer] at facility. EMS called for transport.

## 2019-05-26 ENCOUNTER — Inpatient Hospital Stay: Payer: Medicare PPO

## 2019-05-26 ENCOUNTER — Emergency Department: Payer: Medicare PPO

## 2019-05-26 ENCOUNTER — Observation Stay
Admission: EM | Admit: 2019-05-26 | Discharge: 2019-05-31 | Disposition: A | Discharge status: Skilled Nursing Facility | Payer: Medicare PPO | Attending: Internal Medicine | Admitting: Internal Medicine

## 2019-05-26 ENCOUNTER — Other Ambulatory Visit: Payer: Self-pay

## 2019-05-26 ENCOUNTER — Encounter: Payer: Self-pay | Admitting: Emergency Medicine

## 2019-05-26 DIAGNOSIS — I10 Essential (primary) hypertension: Secondary | ICD-10-CM | POA: Insufficient documentation

## 2019-05-26 DIAGNOSIS — K219 Gastro-esophageal reflux disease without esophagitis: Secondary | ICD-10-CM | POA: Diagnosis not present

## 2019-05-26 DIAGNOSIS — F17200 Nicotine dependence, unspecified, uncomplicated: Secondary | ICD-10-CM | POA: Diagnosis not present

## 2019-05-26 DIAGNOSIS — Z79899 Other long term (current) drug therapy: Secondary | ICD-10-CM | POA: Insufficient documentation

## 2019-05-26 DIAGNOSIS — D638 Anemia in other chronic diseases classified elsewhere: Secondary | ICD-10-CM | POA: Insufficient documentation

## 2019-05-26 DIAGNOSIS — I716 Thoracoabdominal aortic aneurysm, without rupture: Secondary | ICD-10-CM | POA: Diagnosis not present

## 2019-05-26 DIAGNOSIS — M899 Disorder of bone, unspecified: Secondary | ICD-10-CM | POA: Insufficient documentation

## 2019-05-26 DIAGNOSIS — R319 Hematuria, unspecified: Secondary | ICD-10-CM

## 2019-05-26 DIAGNOSIS — Z515 Encounter for palliative care: Secondary | ICD-10-CM | POA: Diagnosis not present

## 2019-05-26 DIAGNOSIS — R079 Chest pain, unspecified: Principal | ICD-10-CM | POA: Insufficient documentation

## 2019-05-26 DIAGNOSIS — R7402 Elevation of levels of lactic acid dehydrogenase (LDH): Secondary | ICD-10-CM | POA: Insufficient documentation

## 2019-05-26 DIAGNOSIS — I82612 Acute embolism and thrombosis of superficial veins of left upper extremity: Secondary | ICD-10-CM | POA: Diagnosis not present

## 2019-05-26 DIAGNOSIS — F329 Major depressive disorder, single episode, unspecified: Secondary | ICD-10-CM | POA: Diagnosis not present

## 2019-05-26 DIAGNOSIS — E8809 Other disorders of plasma-protein metabolism, not elsewhere classified: Secondary | ICD-10-CM | POA: Insufficient documentation

## 2019-05-26 DIAGNOSIS — N1330 Unspecified hydronephrosis: Secondary | ICD-10-CM | POA: Insufficient documentation

## 2019-05-26 DIAGNOSIS — Z7982 Long term (current) use of aspirin: Secondary | ICD-10-CM | POA: Insufficient documentation

## 2019-05-26 DIAGNOSIS — I712 Thoracic aortic aneurysm, without rupture: Secondary | ICD-10-CM | POA: Diagnosis not present

## 2019-05-26 DIAGNOSIS — N390 Urinary tract infection, site not specified: Secondary | ICD-10-CM | POA: Diagnosis present

## 2019-05-26 DIAGNOSIS — R748 Abnormal levels of other serum enzymes: Secondary | ICD-10-CM | POA: Diagnosis not present

## 2019-05-26 DIAGNOSIS — I513 Intracardiac thrombosis, not elsewhere classified: Secondary | ICD-10-CM | POA: Insufficient documentation

## 2019-05-26 DIAGNOSIS — M898X9 Other specified disorders of bone, unspecified site: Secondary | ICD-10-CM

## 2019-05-26 DIAGNOSIS — R590 Localized enlarged lymph nodes: Secondary | ICD-10-CM

## 2019-05-26 DIAGNOSIS — N3001 Acute cystitis with hematuria: Secondary | ICD-10-CM | POA: Diagnosis not present

## 2019-05-26 DIAGNOSIS — Z880 Allergy status to penicillin: Secondary | ICD-10-CM | POA: Insufficient documentation

## 2019-05-26 DIAGNOSIS — R4182 Altered mental status, unspecified: Secondary | ICD-10-CM | POA: Diagnosis present

## 2019-05-26 DIAGNOSIS — Z20822 Contact with and (suspected) exposure to covid-19: Secondary | ICD-10-CM | POA: Diagnosis not present

## 2019-05-26 DIAGNOSIS — J449 Chronic obstructive pulmonary disease, unspecified: Secondary | ICD-10-CM | POA: Diagnosis not present

## 2019-05-26 DIAGNOSIS — N3000 Acute cystitis without hematuria: Secondary | ICD-10-CM

## 2019-05-26 LAB — BASIC METABOLIC PANEL
Anion gap: 10 (ref 5–15)
Anion gap: 7 (ref 5–15)
BUN: 12 mg/dL (ref 8–23)
BUN: 12 mg/dL (ref 8–23)
CO2: 31 mmol/L (ref 22–32)
CO2: 33 mmol/L — ABNORMAL HIGH (ref 22–32)
Calcium: 10 mg/dL (ref 8.9–10.3)
Calcium: 9.8 mg/dL (ref 8.9–10.3)
Chloride: 96 mmol/L — ABNORMAL LOW (ref 98–111)
Chloride: 98 mmol/L (ref 98–111)
Creatinine, Ser: 0.72 mg/dL (ref 0.44–1.00)
Creatinine, Ser: 0.63 mg/dL (ref 0.44–1.00)
GFR calc Af Amer: >60 mL/min (ref >60)
GFR calc Af Amer: >60 mL/min (ref >60)
GFR calc non Af Amer: >60 mL/min (ref >60)
GFR calc non Af Amer: >60 mL/min (ref >60)
Glucose, Bld: 99 mg/dL (ref 70–99)
Glucose, Bld: 95 mg/dL (ref 70–99)
Potassium: 3.5 mmol/L (ref 3.5–5.1)
Potassium: 3.4 mmol/L — ABNORMAL LOW (ref 3.5–5.1)
Sodium: 137 mmol/L (ref 135–145)
Sodium: 138 mmol/L (ref 135–145)

## 2019-05-26 LAB — CBC
HCT: 27.6 % — ABNORMAL LOW (ref 36.0–46.0)
HCT: 23.6 % — ABNORMAL LOW (ref 36.0–46.0)
Hemoglobin: 8.9 g/dL — ABNORMAL LOW (ref 12.0–15.0)
Hemoglobin: 7.4 g/dL — ABNORMAL LOW (ref 12.0–15.0)
MCH: 30.2 pg (ref 26.0–34.0)
MCH: 30.1 pg (ref 26.0–34.0)
MCHC: 32.2 g/dL (ref 30.0–36.0)
MCHC: 31.4 g/dL (ref 30.0–36.0)
MCV: 93.6 fL (ref 80.0–100.0)
MCV: 95.9 fL (ref 80.0–100.0)
Platelets: 366 10*3/uL (ref 150–400)
Platelets: 334 10*3/uL (ref 150–400)
RBC: 2.95 MIL/uL — ABNORMAL LOW (ref 3.87–5.11)
RBC: 2.46 MIL/uL — ABNORMAL LOW (ref 3.87–5.11)
RDW: 16.2 % — ABNORMAL HIGH (ref 11.5–15.5)
RDW: 16 % — ABNORMAL HIGH (ref 11.5–15.5)
WBC: 13.4 10*3/uL — ABNORMAL HIGH (ref 4.0–10.5)
WBC: 11.2 10*3/uL — ABNORMAL HIGH (ref 4.0–10.5)
nRBC: 0 % (ref 0.0–0.2)
nRBC: 0 % (ref 0.0–0.2)

## 2019-05-26 LAB — TROPONIN I (HIGH SENSITIVITY)
Troponin I (High Sensitivity): 10 ng/L (ref <18)
Troponin I (High Sensitivity): 9 ng/L (ref <18)
Troponin I (High Sensitivity): 11 ng/L (ref <18)

## 2019-05-26 LAB — URINALYSIS, COMPLETE (UACMP) WITH MICROSCOPIC
Bilirubin Urine: NEGATIVE
Glucose, UA: NEGATIVE mg/dL
Ketones, ur: 5 mg/dL — AB
Nitrite: NEGATIVE
Protein, ur: 100 mg/dL — AB
RBC / HPF: >50 RBC/hpf — ABNORMAL HIGH (ref 0–5)
Specific Gravity, Urine: 1.017 (ref 1.005–1.030)
WBC, UA: >50 WBC/hpf — ABNORMAL HIGH (ref 0–5)
pH: 5 (ref 5.0–8.0)

## 2019-05-26 LAB — SAMPLE TO BLOOD BANK

## 2019-05-26 LAB — RESPIRATORY PANEL BY RT PCR (FLU A&B, COVID)
Influenza A by PCR: NEGATIVE
Influenza B by PCR: NEGATIVE
SARS Coronavirus 2 by RT PCR: NEGATIVE

## 2019-05-26 LAB — VITAMIN D 25 HYDROXY (VIT D DEFICIENCY, FRACTURES): Vit D, 25-Hydroxy: 57.78 ng/mL (ref 30–100)

## 2019-05-26 LAB — BRAIN NATRIURETIC PEPTIDE: B Natriuretic Peptide: 146 pg/mL — ABNORMAL HIGH (ref 0.0–100.0)

## 2019-05-26 LAB — LACTATE DEHYDROGENASE: LDH: 289 U/L — ABNORMAL HIGH (ref 98–192)

## 2019-05-26 LAB — RETIC PANEL
Immature Retic Fract: 32 % — ABNORMAL HIGH (ref 2.3–15.9)
RBC.: 2.43 MIL/uL — ABNORMAL LOW (ref 3.87–5.11)
Retic Count, Absolute: 93.1 10*3/uL (ref 19.0–186.0)
Retic Ct Pct: 3.8 % — ABNORMAL HIGH (ref 0.4–3.1)
Reticulocyte Hemoglobin: 28.3 pg (ref >27.9)

## 2019-05-26 LAB — HEPATIC FUNCTION PANEL
ALT: 11 U/L (ref 0–44)
AST: 40 U/L (ref 15–41)
Albumin: 1.9 g/dL — ABNORMAL LOW (ref 3.5–5.0)
Alkaline Phosphatase: 664 U/L — ABNORMAL HIGH (ref 38–126)
Bilirubin, Direct: 0.1 mg/dL (ref 0.0–0.2)
Indirect Bilirubin: 0.8 mg/dL (ref 0.3–0.9)
Total Bilirubin: 0.9 mg/dL (ref 0.3–1.2)
Total Protein: 5.3 g/dL — ABNORMAL LOW (ref 6.5–8.1)

## 2019-05-26 LAB — PATHOLOGIST SMEAR REVIEW

## 2019-05-26 MED ORDER — MOMETASONE FURO-FORMOTEROL FUM 200-5 MCG/ACT IN AERO
2.0000 | INHALATION_SPRAY | Freq: Two times a day (BID) | RESPIRATORY_TRACT | Status: DC
  Administered 2019-05-26 – 2019-05-31 (×11): 2 via RESPIRATORY_TRACT
  Filled 2019-05-26: qty 8.8

## 2019-05-26 MED ORDER — SODIUM CHLORIDE 0.9 % IV SOLN: INTRAVENOUS | Status: DC

## 2019-05-26 MED ORDER — OXYBUTYNIN CHLORIDE 5 MG PO TABS
5.0000 mg | ORAL_TABLET | Freq: Three times a day (TID) | ORAL | Status: DC
  Administered 2019-05-26 – 2019-05-31 (×16): 5 mg via ORAL
  Filled 2019-05-26 (×16): qty 1

## 2019-05-26 MED ORDER — ACETAMINOPHEN 650 MG RE SUPP: 650.0000 mg | Freq: Four times a day (QID) | RECTAL | Status: DC | PRN

## 2019-05-26 MED ORDER — SODIUM CHLORIDE 0.9 % IV SOLN
1.0000 g | Freq: Once | INTRAVENOUS | Status: AC
  Administered 2019-05-26: 05:00:00 1 g via INTRAVENOUS
  Filled 2019-05-26: qty 10

## 2019-05-26 MED ORDER — PREGABALIN 50 MG PO CAPS
100.0000 mg | ORAL_CAPSULE | Freq: Three times a day (TID) | ORAL | Status: DC
  Administered 2019-05-26 – 2019-05-31 (×16): 100 mg via ORAL
  Filled 2019-05-26 (×16): qty 2

## 2019-05-26 MED ORDER — CALCIUM ACETATE (PHOS BINDER) 667 MG/5ML PO SOLN
667.0000 mg | Freq: Two times a day (BID) | ORAL | Status: DC
  Administered 2019-05-26 – 2019-05-31 (×11): 667 mg via ORAL
  Filled 2019-05-26 (×12): qty 5

## 2019-05-26 MED ORDER — ALBUTEROL SULFATE (2.5 MG/3ML) 0.083% IN NEBU: 2.5000 mg | INHALATION_SOLUTION | Freq: Four times a day (QID) | RESPIRATORY_TRACT | Status: DC | PRN

## 2019-05-26 MED ORDER — IOHEXOL 350 MG/ML SOLN
100.0000 mL | Freq: Once | INTRAVENOUS | Status: AC | PRN
  Administered 2019-05-26: 100 mL via INTRAVENOUS

## 2019-05-26 MED ORDER — ENSURE ENLIVE PO LIQD
237.0000 mL | Freq: Two times a day (BID) | ORAL | Status: DC
  Administered 2019-05-26 – 2019-05-31 (×7): 237 mL via ORAL

## 2019-05-26 MED ORDER — DOCUSATE SODIUM 100 MG PO CAPS
100.0000 mg | ORAL_CAPSULE | Freq: Two times a day (BID) | ORAL | Status: DC
  Administered 2019-05-26 – 2019-05-31 (×11): 100 mg via ORAL
  Filled 2019-05-26 (×11): qty 1

## 2019-05-26 MED ORDER — MIDODRINE HCL 5 MG PO TABS
10.0000 mg | ORAL_TABLET | Freq: Three times a day (TID) | ORAL | Status: DC
  Administered 2019-05-26 – 2019-05-31 (×16): 10 mg via ORAL
  Filled 2019-05-26 (×16): qty 2

## 2019-05-26 MED ORDER — VITAMIN D 25 MCG (1000 UNIT) PO TABS
1000.0000 [IU] | ORAL_TABLET | Freq: Every day | ORAL | Status: DC
  Administered 2019-05-26 – 2019-05-31 (×6): 1000 [IU] via ORAL
  Filled 2019-05-26 (×6): qty 1

## 2019-05-26 MED ORDER — ENOXAPARIN SODIUM 40 MG/0.4ML ~~LOC~~ SOLN
40.0000 mg | SUBCUTANEOUS | Status: DC
  Administered 2019-05-27 – 2019-05-31 (×5): 40 mg via SUBCUTANEOUS
  Filled 2019-05-26 (×5): qty 0.4

## 2019-05-26 MED ORDER — TRAZODONE HCL 50 MG PO TABS
25.0000 mg | ORAL_TABLET | Freq: Every evening | ORAL | Status: DC | PRN
  Administered 2019-05-27: 21:00:00 25 mg via ORAL
  Filled 2019-05-26: qty 1

## 2019-05-26 MED ORDER — ACETAMINOPHEN 325 MG PO TABS
650.0000 mg | ORAL_TABLET | Freq: Four times a day (QID) | ORAL | Status: DC | PRN
  Administered 2019-05-26 (×2): 650 mg via ORAL
  Filled 2019-05-26 (×2): qty 2

## 2019-05-26 MED ORDER — ADULT MULTIVITAMIN W/MINERALS CH
1.0000 | ORAL_TABLET | Freq: Every day | ORAL | Status: DC
  Administered 2019-05-26 – 2019-05-31 (×6): 1 via ORAL
  Filled 2019-05-26 (×6): qty 1

## 2019-05-26 MED ORDER — ONDANSETRON HCL 4 MG PO TABS: 4.0000 mg | ORAL_TABLET | Freq: Four times a day (QID) | ORAL | Status: DC | PRN

## 2019-05-26 MED ORDER — POLYETHYLENE GLYCOL 3350 17 G PO PACK
17.0000 g | PACK | Freq: Every day | ORAL | Status: DC
  Administered 2019-05-26 – 2019-05-31 (×6): 17 g via ORAL
  Filled 2019-05-26 (×6): qty 1

## 2019-05-26 MED ORDER — SODIUM CHLORIDE 0.9 % IV SOLN: 1.0000 g | INTRAVENOUS | Status: DC

## 2019-05-26 MED ORDER — PANTOPRAZOLE SODIUM 40 MG PO TBEC
40.0000 mg | DELAYED_RELEASE_TABLET | Freq: Every day | ORAL | Status: DC
  Administered 2019-05-26 – 2019-05-31 (×6): 40 mg via ORAL
  Filled 2019-05-26 (×6): qty 1

## 2019-05-26 MED ORDER — ASPIRIN EC 81 MG PO TBEC
81.0000 mg | DELAYED_RELEASE_TABLET | Freq: Every day | ORAL | Status: DC
  Administered 2019-05-26 – 2019-05-30 (×5): 81 mg via ORAL
  Filled 2019-05-26 (×5): qty 1

## 2019-05-26 MED ORDER — ROSUVASTATIN CALCIUM 10 MG PO TABS
10.0000 mg | ORAL_TABLET | Freq: Every day | ORAL | Status: DC
  Administered 2019-05-26 – 2019-05-30 (×5): 10 mg via ORAL
  Filled 2019-05-26 (×5): qty 1

## 2019-05-26 MED ORDER — ONDANSETRON HCL 4 MG/2ML IJ SOLN: 4.0000 mg | Freq: Four times a day (QID) | INTRAMUSCULAR | Status: DC | PRN

## 2019-05-26 MED ORDER — ENOXAPARIN SODIUM 40 MG/0.4ML ~~LOC~~ SOLN: 40.0000 mg | SUBCUTANEOUS | Status: DC

## 2019-05-26 MED ORDER — MORPHINE SULFATE (PF) 2 MG/ML IV SOLN
0.5000 mg | INTRAVENOUS | Status: DC | PRN
  Administered 2019-05-26 – 2019-05-27 (×2): 0.5 mg via INTRAVENOUS
  Filled 2019-05-26 (×2): qty 1

## 2019-05-26 MED ORDER — LORATADINE 10 MG PO TABS
10.0000 mg | ORAL_TABLET | Freq: Every day | ORAL | Status: DC
  Administered 2019-05-26 – 2019-05-31 (×6): 10 mg via ORAL
  Filled 2019-05-26 (×6): qty 1

## 2019-05-26 MED ORDER — FE FUMARATE-B12-VIT C-FA-IFC PO CAPS
1.0000 | ORAL_CAPSULE | Freq: Two times a day (BID) | ORAL | Status: DC
  Administered 2019-05-26 – 2019-05-31 (×11): 1 via ORAL
  Filled 2019-05-26 (×12): qty 1

## 2019-05-26 MED ORDER — CEPHALEXIN 500 MG PO CAPS
500.0000 mg | ORAL_CAPSULE | Freq: Three times a day (TID) | ORAL | Status: DC
  Administered 2019-05-26 – 2019-05-29 (×8): 500 mg via ORAL
  Filled 2019-05-26 (×8): qty 1

## 2019-05-26 MED ORDER — OXYCODONE HCL 5 MG PO TABS
5.0000 mg | ORAL_TABLET | ORAL | Status: DC | PRN
  Administered 2019-05-26 – 2019-05-30 (×7): 5 mg via ORAL
  Filled 2019-05-26 (×7): qty 1

## 2019-05-26 MED ORDER — NICOTINE 14 MG/24HR TD PT24
14.0000 mg | MEDICATED_PATCH | Freq: Every day | TRANSDERMAL | Status: DC
  Filled 2019-05-26 (×5): qty 1

## 2019-05-26 MED ORDER — ALUM & MAG HYDROXIDE-SIMETH 200-200-20 MG/5ML PO SUSP: 30.0000 mL | ORAL | Status: DC | PRN

## 2019-05-26 MED ORDER — TIOTROPIUM BROMIDE MONOHYDRATE 18 MCG IN CAPS
1.0000 | ORAL_CAPSULE | Freq: Every day | RESPIRATORY_TRACT | Status: DC
  Administered 2019-05-26 – 2019-05-31 (×6): 18 ug via RESPIRATORY_TRACT
  Filled 2019-05-26: qty 5

## 2019-05-26 MED ORDER — CALCIUM CARBONATE-VITAMIN D 500-200 MG-UNIT PO TABS
1.0000 | ORAL_TABLET | Freq: Every day | ORAL | Status: DC
  Administered 2019-05-26 – 2019-05-27 (×2): 1 via ORAL
  Filled 2019-05-26 (×2): qty 1

## 2019-05-26 MED ORDER — MAGNESIUM HYDROXIDE 400 MG/5ML PO SUSP
30.0000 mL | Freq: Every day | ORAL | Status: DC | PRN
  Filled 2019-05-26: qty 30

## 2019-05-26 MED ORDER — B COMPLEX-C PO TABS: 1.0000 | ORAL_TABLET | Freq: Every day | ORAL | Status: DC

## 2019-05-26 MED ORDER — BISACODYL 10 MG RE SUPP
10.0000 mg | Freq: Every day | RECTAL | Status: DC | PRN
  Filled 2019-05-26: qty 1

## 2019-05-26 NOTE — Consult Note (Signed)
[facility] Vascular Consult Note  MRN : [id number]  Holly HickmanDeanna L Shelton is a 79 y.o. (Apr 27, 1940) female who presents with chief complaint of  Chief Complaint  Patient presents with  . Chest Pain   History of Present Illness:  The patient is a 79 year old female with multiple medical issues (see below) including known history of thoracic aortic aneurysm [facility] with a chief complaint of chest pain.  Seen was family member at bedside.  Patient is slightly confused so history for this consult was obtained from the patient, family member previous epic notation. presented to the emergency room with acute onset of chest pain as well as abdominal pain and left arm swelling with mild pain without erythema and altered mental status.  She denied any fever or chills.  She has been having urinary frequency without dysuria or hematuria or flank pain.  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 This measures up to 5.5 cm in the distal aortic arch. 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 Extensive mural plaque/thrombus in the distal descending thoracic aorta and upper abdominal aorta. No evidence of dissection. No leak.  Vascular surgery was consulted by Dr. Manson PasseyBrown and Dr. Arville CareMansy for recommendations on the patient's thoracic aneurysm.  Current Facility-Administered Medications  Medication Dose Route Frequency Provider Last Rate Last Admin  . 0.9 %  sodium chloride infusion   Intravenous Continuous Mansy, Jan A, MD 100 mL/hr at 05/26/19 0605 Rate Verify at 05/26/19 0605  . acetaminophen (TYLENOL) tablet 650 mg  650 mg Oral Q6H PRN Mansy, Jan A, MD   650 mg at 05/26/19 1020   Or  . acetaminophen (TYLENOL) suppository 650 mg  650 mg Rectal Q6H PRN Mansy, Jan A, MD      . albuterol (PROVENTIL) (2.5 MG/3ML) 0.083%  nebulizer solution 2.5 mg  2.5 mg Inhalation Q6H PRN Mansy, Jan A, MD      . alum & mag hydroxide-simeth (MAALOX/MYLANTA) 200-200-20 MG/5ML suspension 30 mL  30 mL Oral Q4H PRN Mansy, Jan A, MD      . aspirin EC tablet 81 mg  81 mg Oral QHS Mansy, Jan A, MD      . bisacodyl (DULCOLAX) suppository 10 mg  10 mg Rectal Daily PRN Mansy, Jan A, MD      . calcium acetate (Phos Binder) (PHOSLYRA) 667 MG/5ML oral solution 667 mg  667 mg Oral BID WC Mansy, Jan A, MD   667 mg at 05/26/19 1301  . calcium-vitamin D (OSCAL WITH D) 500-200 MG-UNIT per tablet 1 tablet  1 tablet Oral Daily Mansy, Jan A, MD   1 tablet at 05/26/19 1018  . [START ON 05/27/2019] cefTRIAXone (ROCEPHIN) 1 g in sodium chloride 0.9 % 100 mL IVPB  1 g Intravenous Q24H Mansy, Jan A, MD      . cholecalciferol (VITAMIN D3) tablet 1,000 Units  1,000 Units Oral Daily Mansy, Jan A, MD   1,000 Units at 05/26/19 1017  . docusate sodium (COLACE) capsule 100 mg  100 mg Oral BID Mansy, Jan A, MD   100 mg at 05/26/19 1018  . enoxaparin (LOVENOX) injection 40 mg  40 mg Subcutaneous Q24H Mansy, Jan A, MD      . feeding supplement (ENSURE ENLIVE) (ENSURE ENLIVE) liquid 237 mL  237 mL Oral BID BM Mansy, Vernetta HoneyJan A, MD   237 mL  at 05/26/19 1301  . ferrous fumarate-b12-vitamic C-folic acid (TRINSICON / FOLTRIN) capsule 1 capsule  1 capsule Oral BID Mansy, Jan A, MD   1 capsule at 05/26/19 1022  . loratadine (CLARITIN) tablet 10 mg  10 mg Oral Daily Mansy, Jan A, MD   10 mg at 05/26/19 1018  . magnesium hydroxide (MILK OF MAGNESIA) suspension 30 mL  30 mL Oral Daily PRN Mansy, Jan A, MD      . midodrine (PROAMATINE) tablet 10 mg  10 mg Oral TID WC Mansy, Jan A, MD   10 mg at 05/26/19 1301  . mometasone-formoterol (DULERA) 200-5 MCG/ACT inhaler 2 puff  2 puff Inhalation BID Mansy, Jan A, MD   2 puff at 05/26/19 1023  . multivitamin with minerals tablet 1 tablet  1 tablet Oral Daily Mansy, Jan A, MD   1 tablet at 05/26/19 1018  . nicotine (NICODERM CQ - dosed in  mg/24 hours) patch 14 mg  14 mg Transdermal Daily Mansy, Jan A, MD      . ondansetron [facility]) tablet 4 mg  4 mg Oral Q6H PRN Mansy, Jan A, MD       Or  . ondansetron [facility]) injection 4 mg  4 mg Intravenous Q6H PRN Mansy, Jan A, MD      . oxybutynin (DITROPAN) tablet 5 mg  5 mg Oral TID Mansy, Jan A, MD   5 mg at 05/26/19 1018  . pantoprazole (PROTONIX) EC tablet 40 mg  40 mg Oral Daily Mansy, Jan A, MD   40 mg at 05/26/19 1017  . polyethylene glycol (MIRALAX / GLYCOLAX) packet 17 g  17 g Oral Daily Mansy, Jan A, MD   17 g at 05/26/19 1021  . pregabalin (LYRICA) capsule 100 mg  100 mg Oral TID Mansy, Jan A, MD   100 mg at 05/26/19 1018  . rosuvastatin (CRESTOR) tablet 10 mg  10 mg Oral QHS Mansy, Jan A, MD      . tiotropium [facility]) inhalation capsule [facility] use ONLY) 18 mcg  1 capsule Inhalation Daily Mansy, Jan A, MD   18 mcg at 05/26/19 1022  . traZODone (DESYREL) tablet 25 mg  25 mg Oral QHS PRN Mansy, Vernetta Honey, MD       Past Medical History:  Diagnosis Date  . Hypertension    Past Surgical History:  Procedure Laterality Date  . INTRAMEDULLARY (IM) NAIL INTERTROCHANTERIC Right 05/07/2019   Procedure: INTRAMEDULLARY (IM) NAIL INTERTROCHANTRIC;  Surgeon: Kennedy Bucker, MD;  Location: [facility] ORS;  Service: Orthopedics;  Laterality: Right;   Social History Social History   Tobacco Use  . Smoking status: Current Every Day Smoker  . Smokeless tobacco: Never Used  Substance Use Topics  . Alcohol use: Never  . Drug use: Never   Family History History reviewed. No pertinent family history.  Denies family history of peripheral artery disease, venous disease or renal disease.  Allergies  Allergen Reactions  . Penicillins Rash    Did it involve swelling of the face/tongue/throat, SOB, or low BP? No Did it involve sudden or severe rash/hives, skin peeling, or any reaction on the inside of your mouth or nose? Yes Did you need to seek medical attention at a hospital or doctor's office?  No When did it last happen? If all above answers are "NO", may proceed with cephalosporin use.   REVIEW OF SYSTEMS (Negative unless checked)  Constitutional: [] Weight loss  [] Fever  [] Chills Cardiac: [x] Chest pain   [x] Chest pressure   []   Palpitations   Shortness of breath when laying flat   Shortness of breath at rest   Shortness of breath with exertion. Vascular:  Pain in legs with walking   Pain in legs at rest   Pain in legs when laying flat   Claudication   Pain in feet when walking  Pain in feet at rest  Pain in feet when laying flat   History of DVT   Phlebitis   Swelling in legs   Varicose veins   Non-healing ulcers Pulmonary:   Uses home oxygen   Productive cough   Hemoptysis   Wheeze  COPD   Asthma Neurologic:  Dizziness  Blackouts   Seizures   History of stroke   History of TIA  Aphasia   Temporary blindness   Dysphagia   Weakness or numbness in arms   Weakness or numbness in legs Musculoskeletal:  Arthritis   Joint swelling   Joint pain   Low back pain Hematologic:  Easy bruising  Easy bleeding   Hypercoagulable state   Anemic  Hepatitis Gastrointestinal:  Blood in stool   Vomiting blood  Gastroesophageal reflux/heartburn   Difficulty swallowing. Genitourinary:  Chronic kidney disease   Difficult urination  Frequent urination  Burning with urination   Blood in urine Skin:  Rashes   Ulcers   Wounds Psychological:  History of anxiety    History of major depression.  Physical Examination  Vitals:   05/26/19 0330 05/26/19 0433 05/26/19 0545 05/26/19 0830  BP: 105/67 114/67  (!) 111/49  Pulse: 85 85    Resp: (!) 37 20  19  Temp:    (!) 97.4 F (36.3 C)  TempSrc:    Axillary  SpO2: 97% 99%  96%  Weight:   78.2 kg   Height:   5' (1.524 m)    Body mass index is 33.67 kg/m. Gen:  Confused, NAD Head: [place]/AT, No temporalis wasting. Prominent temp pulse not  noted. Ear/Nose/Throat: Hearing grossly intact, nares w/o erythema or drainage, oropharynx w/o Erythema/Exudate Eyes: Sclera non-icteric, conjunctiva clear Neck: Trachea midline.  No JVD.  Pulmonary:  Good air movement, respirations not labored, equal bilaterally.  Cardiac: RRR, normal S1, S2. Vascular:  Vessel Right Left  Radial Palpable Palpable  Ulnar Palpable Palpable  Brachial Palpable Palpable  Carotid Palpable, without bruit Palpable, without bruit  Aorta Not palpable N/A  Femoral Palpable Palpable  Popliteal Palpable Palpable  PT Palpable Palpable  DP Palpable Palpable   Gastrointestinal: soft, non-tender/non-distended. No guarding/reflex. Musculoskeletal: M/S 5/5 throughout.  Extremities without ischemic changes.  No deformity or atrophy. No edema. Neurologic: Sensation grossly intact in extremities.  Symmetrical.  Speech is fluent. Motor exam as listed above. Psychiatric: Judgment intact, Mood & affect appropriate for pt's clinical situation. Dermatologic: No rashes or ulcers noted.  No cellulitis or open wounds. Lymph : No Cervical, Axillary, or Inguinal lymphadenopathy.  CBC Lab Results  Component Value Date   WBC 11.2 (H) 05/26/2019   HGB 7.4 (L) 05/26/2019   HCT 23.6 (L) 05/26/2019   MCV 95.9 05/26/2019   PLT 334 05/26/2019   BMET    Component Value Date/Time   NA 138 05/26/2019 0424   NA 134 (L) 05/19/2013 1059   K 3.4 (L) 05/26/2019 0424   K 2.9 (L) 05/19/2013 1059   CL 98 05/26/2019 0424   CL 96 (L) 05/19/2013 1059   CO2 33 (H) 05/26/2019 0424   CO2 29 05/19/2013 1059   GLUCOSE 95 05/26/2019 0424   GLUCOSE  153 (H) 05/19/2013 1059   BUN 12 05/26/2019 0424   BUN 14 05/19/2013 1059   CREATININE 0.63 05/26/2019 0424   CREATININE 0.96 05/19/2013 1059   CALCIUM 9.8 05/26/2019 0424   CALCIUM 9.3 05/19/2013 1059   GFRNONAA >60 05/26/2019 0424   GFRNONAA 60 (L) 05/19/2013 1059   GFRAA >60 05/26/2019 0424   GFRAA >60 05/19/2013 1059   Estimated  Creatinine Clearance: 55.3 mL/min (by C-G formula based on SCr of 0.63 mg/dL).  COAG Lab Results  Component Value Date   INR 1.2 05/19/2019   Radiology DG Chest 1 View  Result Date: 05/06/2019 CLINICAL DATA:  Recent fall with chest pain, initial encounter EXAM: CHEST  1 VIEW COMPARISON:  05/19/2013 FINDINGS: Cardiac shadow is enlarged. Thoracic aorta is tortuous and accentuated by mild patient rotation. The lungs are clear bilaterally. No acute bony abnormality is seen. IMPRESSION: Cardiomegaly and tortuous aorta.  No acute abnormality is seen. Electronically Signed   By: Alcide Clever M.D.   On: 05/06/2019 19:03   CT Head Wo Contrast  Result Date: 05/26/2019 CLINICAL DATA:  Altered mental status. EXAM: CT HEAD WITHOUT CONTRAST TECHNIQUE: Contiguous axial images were obtained from the base of the skull through the vertex without intravenous contrast. COMPARISON:  None. FINDINGS: Brain: Age related cerebral atrophy, ventriculomegaly and periventricular white matter disease. No extra-axial fluid collections are identified. No CT findings for acute hemispheric infarction or intracranial hemorrhage. No mass lesions. The brainstem and cerebellum are normal. Vascular: Vascular calcifications but no aneurysm or hyperdense vessels. Skull: No skull fracture. Numerous small lucencies in the skull. Could not exclude metastatic disease or myeloma. Sinuses/Orbits: The paranasal sinuses and mastoid air cells are clear. Other: No scalp lesions or hematoma. IMPRESSION: 1. Age related cerebral atrophy, ventriculomegaly and periventricular white matter disease. 2. No acute intracranial findings or mass lesions. 3. Numerous small lucencies in the skull. Likely metastatic disease or myeloma. Electronically Signed   By: Rudie Meyer M.D.   On: 05/26/2019 07:27   US Venous Img Upper Uni Left  Addendum Date: 05/26/2019   ADDENDUM REPORT: 05/26/2019 04:49 ADDENDUM: Study discussed by telephone with Dr. Bayard Males on  05/26/2019 at 0438 hours. Electronically Signed   By: Odessa Fleming M.D.   On: 05/26/2019 04:49   Result Date: 05/26/2019 CLINICAL DATA:  79 year old female with left upper extremity swelling. Chest pain, shortness of breath. Thoracoabdominal aneurysm and mixed lytic and sclerotic skeletal lesions on CTA today. EXAM: LEFT UPPER EXTREMITY VENOUS DOPPLER ULTRASOUND TECHNIQUE: Gray-scale sonography with graded compression, as well as color Doppler and duplex ultrasound were performed to evaluate the upper extremity deep venous system from the level of the subclavian vein and including the jugular, axillary, basilic, radial, ulnar and upper cephalic vein. Spectral Doppler was utilized to evaluate flow at rest and with distal augmentation maneuvers. COMPARISON:  CTA chest abdomen and pelvis earlier today. FINDINGS: Contralateral Subclavian Vein: Respiratory phasicity is normal and symmetric with the symptomatic side. No evidence of thrombus. Normal compressibility. Internal Jugular Vein: No evidence of thrombus. Normal compressibility, respiratory phasicity and response to augmentation. Subclavian Vein: No evidence of thrombus. Normal compressibility, respiratory phasicity and response to augmentation. Axillary Vein: No evidence of thrombus. Normal compressibility, respiratory phasicity and response to augmentation. Cephalic Vein: There is a thrombosed segment of the distal cephalic vein (image 15) extending to just above the elbow (images 16 and 17). The more proximal cephalic vein appears normally compressible and patent (image 14). And the more distal cephalic vein appears  at least partially patent. Basilic Vein: No evidence of thrombus. Normal compressibility, respiratory phasicity and response to augmentation. Brachial Veins: No evidence of thrombus. Normal compressibility, respiratory phasicity and response to augmentation. Radial Veins: No evidence of thrombus. Normal compressibility, respiratory phasicity and response  to augmentation. Ulnar Veins: No evidence of thrombus. Normal compressibility, respiratory phasicity and response to augmentation. Other Findings:  None visualized. IMPRESSION: 1. Positive for left upper extremity superficial venous thrombosis involving the midportion of the left cephalic vein. 2. Negative for left upper extremity deep venous thrombosis. Electronically Signed: By: Genevie Ann M.D. On: 05/26/2019 04:32   DG Chest Port 1 View  Result Date: 05/26/2019 CLINICAL DATA:  Chest pain. Dyspnea. EXAM: PORTABLE CHEST 1 VIEW COMPARISON:  Radiograph 8 days ago 05/18/2019. FINDINGS: Low lung volumes. Stable cardiomegaly. Stable mediastinal contours with aortic tortuosity. Obscuration of left hemidiaphragm likely a small effusion. Streaky right lung base opacity. Vascular congestion. No visualized pneumothorax. No acute osseous abnormalities are seen. IMPRESSION: 1. Low lung volumes. Grossly stable cardiomegaly with vascular congestion. 2. Chronic aortic tortuosity, unchanged allowing for differences in technique and positioning. 3. Probable small left pleural effusion. 4. Streaky right lung base opacity, favor atelectasis. Electronically Signed   By: Keith Rake M.D.   On: 05/26/2019 01:36   DG Chest Portable 1 View  Result Date: 05/18/2019 CLINICAL DATA:  Sepsis. EXAM: PORTABLE CHEST 1 VIEW COMPARISON:  May 06, 2019. FINDINGS: Stable cardiomegaly. No pneumothorax or pleural effusion is noted. Left lung is clear. Minimal right basilar subsegmental atelectasis is noted. Bony thorax is unremarkable. IMPRESSION: Minimal right basilar subsegmental atelectasis. Electronically Signed   By: Marijo Conception M.D.   On: 05/18/2019 12:28   ECHOCARDIOGRAM COMPLETE  Result Date: 05/08/2019    ECHOCARDIOGRAM REPORT   Patient Name:   HARRIET SUTPHEN Date of Exam: 05/08/2019 Medical Rec #:  [id number]      Height:       60.0 in Accession #:    [id number]     Weight:       156.5 lb Date of Birth:  [birth year]      BSA:           1.682 m Patient Age:    37 years       BP:           87/57 mmHg Patient Gender: F              HR:           74 bpm. Exam Location:  [facility] Procedure: 2D Echo, Color Doppler and Cardiac Doppler Indications:     R07.9 Chest Pain  History:         Patient has no prior history of Echocardiogram examinations.                  Risk Factors:Hypertension.  Sonographer:     Charmayne Sheer RDCS (AE) Referring Phys:  [place] Diagnosing Phys: Kathlyn Sacramento MD  Sonographer Comments: Technically difficult study due to poor echo windows. TDS due to inability to position pt due to broken hip. IMPRESSIONS  1. Left ventricular ejection fraction, by estimation, is 55 to 60%. The left ventricle has normal function. Left ventricular endocardial border not optimally defined to evaluate regional wall motion. There is mild left ventricular hypertrophy. Left ventricular diastolic parameters are consistent with Grade I diastolic dysfunction (impaired relaxation).  2. Right ventricular systolic function is normal. The right ventricular size is normal. Tricuspid regurgitation signal is inadequate  for assessing PA pressure.  3. The mitral valve is normal in structure. No evidence of mitral valve regurgitation. No evidence of mitral stenosis.  4. The aortic valve is normal in structure. Aortic valve regurgitation is not visualized. No aortic stenosis is present.  5. The inferior vena cava is normal in size with greater than 50% respiratory variability, suggesting right atrial pressure of 3 mmHg. FINDINGS  Left Ventricle: Left ventricular ejection fraction, by estimation, is 55 to 60%. The left ventricle has normal function. Left ventricular endocardial border not optimally defined to evaluate regional wall motion. The left ventricular internal cavity size was normal in size. There is mild left ventricular hypertrophy. Left ventricular diastolic parameters are consistent with Grade I diastolic dysfunction (impaired relaxation). Right  Ventricle: The right ventricular size is normal. No increase in right ventricular wall thickness. Right ventricular systolic function is normal. Tricuspid regurgitation signal is inadequate for assessing PA pressure. Left Atrium: Left atrial size was normal in size. Right Atrium: Right atrial size was normal in size. Pericardium: There is no evidence of pericardial effusion. Mitral Valve: The mitral valve is normal in structure. Normal mobility of the mitral valve leaflets. No evidence of mitral valve regurgitation. No evidence of mitral valve stenosis. MV peak gradient, 3.2 mmHg. The mean mitral valve gradient is 1.0 mmHg. Tricuspid Valve: The tricuspid valve is normal in structure. Tricuspid valve regurgitation is trivial. No evidence of tricuspid stenosis. Aortic Valve: The aortic valve is normal in structure. Aortic valve regurgitation is not visualized. No aortic stenosis is present. Aortic valve mean gradient measures 4.0 mmHg. Aortic valve peak gradient measures 9.5 mmHg. Aortic valve area, by VTI measures 3.58 cm. Pulmonic Valve: The pulmonic valve was normal in structure. Pulmonic valve regurgitation is not visualized. No evidence of pulmonic stenosis. Aorta: The aortic root is normal in size and structure. Venous: The inferior vena cava is normal in size with greater than 50% respiratory variability, suggesting right atrial pressure of 3 mmHg. IAS/Shunts: No atrial level shunt detected by color flow Doppler.  LEFT VENTRICLE PLAX 2D LVIDd:         3.35 cm  Diastology LVIDs:         2.08 cm  LV e' lateral:   6.09 cm/s LV PW:         0.83 cm  LV E/e' lateral: 8.9 LV IVS:        0.67 cm  LV e' medial:    6.42 cm/s LVOT diam:     2.40 cm  LV E/e' medial:  8.5 LV SV:         77 LV SV Index:   46 LVOT Area:     4.52 cm  LEFT ATRIUM           Index LA diam:      4.10 cm 2.44 cm/m LA Vol (A4C): 49.7 ml 29.55 ml/m  AORTIC VALVE                   PULMONIC VALVE AV Area (Vmax):    2.80 cm    PV Vmax:       1.05  m/s AV Area (Vmean):   3.42 cm    PV Vmean:      65.200 cm/s AV Area (VTI):     3.58 cm    PV VTI:        0.176 m AV Vmax:           154.00 cm/s PV Peak grad:  4.4  mmHg AV Vmean:          92.100 cm/s PV Mean grad:  2.0 mmHg AV VTI:            0.216 m AV Peak Grad:      9.5 mmHg AV Mean Grad:      4.0 mmHg LVOT Vmax:         95.30 cm/s LVOT Vmean:        69.600 cm/s LVOT VTI:          0.171 m LVOT/AV VTI ratio: 0.79  AORTA Ao Root diam: 3.40 cm MITRAL VALVE MV Area (PHT): 4.68 cm    SHUNTS MV Peak grad:  3.2 mmHg    Systemic VTI:  0.17 m MV Mean grad:  1.0 mmHg    Systemic Diam: 2.40 cm MV Vmax:       0.89 m/s MV Vmean:      41.3 cm/s MV Decel Time: 162 msec MV E velocity: 54.30 cm/s MV A velocity: 79.50 cm/s MV E/A ratio:  0.68 Lorine Bears MD Electronically signed by Lorine Bears MD Signature Date/Time: 05/08/2019/4:50:52 PM    Final    CT Angio Chest/Abd/Pel for Dissection W and/or Wo Contrast  Result Date: 05/26/2019 CLINICAL DATA:  Chest, abdomen and back pain EXAM: CT ANGIOGRAPHY CHEST, ABDOMEN AND PELVIS TECHNIQUE: Non-contrast CT of the chest was initially obtained. Multidetector CT imaging through the chest, abdomen and pelvis was performed using the standard protocol during bolus administration of intravenous contrast. Multiplanar reconstructed images and MIPs were obtained and reviewed to evaluate the vascular anatomy. CONTRAST:  OMNIPAQUE IOHEXOL 350 MG/ML SOLN COMPARISON:  None. FINDINGS: CTA CHEST FINDINGS Cardiovascular: Aneurysmal dilatation of the distal aortic arch measuring up to 5.3 cm. Descending thoracic aorta is dilated measuring 5.5 cm just above the aortic hiatus. Extensive mural plaque within the distal descending thoracic aorta. No dissection. Cardiomegaly. Ascending thoracic aorta mildly aneurysmal at 4.1 cm. Aortic atherosclerosis. No filling defects in the pulmonary arteries to suggest pulmonary emboli. Mediastinum/Nodes: Esophagus is fluid-filled. No mediastinal, hilar,  or axillary adenopathy. Lungs/Pleura: Small to moderate bilateral pleural effusions. Vascular congestion. Compressive atelectasis in the lungs bilaterally. Musculoskeletal: Chest wall soft tissues are unremarkable. Scoliosis. Diffuse degenerative changes in the thoracic spine. Sclerotic foci seen throughout the ribs and thoracic spine. Lytic expansile lesion within the posterior left 5th rib measures 15 mm. Numerous old and healing fractures, possibly pathologic. Lytic lesion within the right posterior 10th rib. Small sclerotic foci within the clavicles, humeral heads and scapula. Destructive lytic lesion within the left scapula. Review of the MIP images confirms the above findings. CTA ABDOMEN AND PELVIS FINDINGS VASCULAR Aorta: Markedly aneurysmal, measuring up to 5.8 cm in the proximal aorta, tapering to normal size above the bifurcation, 1.6 cm. Extensive mural plaque/thrombus within the proximal aorta. Celiac: Patent SMA: Patent Renals: Small bilaterally, patent. IMA: Patent Inflow: Atherosclerotic calcifications. Patent. No aneurysm or dissection. Veins: No obvious venous abnormality within the limitations of this arterial phase study. Review of the MIP images confirms the above findings. NON-VASCULAR Hepatobiliary: Layering gallstones within the gallbladder. No focal hepatic abnormality. Pancreas: No focal abnormality or ductal dilatation. Spleen: No focal abnormality.  Normal size. Adrenals/Urinary Tract: Bilateral hydronephrosis. 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 Left ureter is mildly prominent proximally then tapers to normal size. No visible obstructing process. 13 mm nonobstructing right midpole renal stone. Adrenal glands and urinary bladder unremarkable. Stomach/Bowel: Moderate stool burden in the colon. No evidence of bowel obstruction. Lymphatic: Retroperitoneal  lymph node. Index left periaortic node has a short axis diameter of 15 mm. Separate adjacent left periaortic  lymph node has a short axis diameter of 11 mm. Reproductive: Prior hysterectomy.  No adnexal masses. Other: No free fluid or free air. Musculoskeletal: Extensive mixed lytic and sclerotic foci throughout the lumbar spine and bony pelvis concerning for bony metastatic disease. Review of the MIP images confirms the above findings. 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 This measures up to 5.5 cm in the distal aortic arch. 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 Extensive mural plaque/thrombus in the distal descending thoracic aorta and upper abdominal aorta. No evidence of dissection. No leak. Small to moderate bilateral pleural effusions. Compressive atelectasis in the lower lobes. Bilateral hydronephrosis. 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 On the left, the proximal ureter appears mildly prominent as well with possible wall enhancement. No visible obstructing stones. Cannot exclude urothelial lesion within the proximal left ureter. Nonobstructing right nephrolithiasis. Retroperitoneal adenopathy. Extensive mixed lytic and sclerotic lesions throughout the visualized skeleton most compatible with osseous metastatic disease. This is of unknown primary. Electronically Signed   By: Charlett Nose M.D.   On: 05/26/2019 02:35   DG HIP PORT UNILAT WITH PELVIS 1V RIGHT  Result Date: 05/09/2019 CLINICAL DATA:  RIGHT hip pain, hip surgery on 05/07/2019 EXAM: DG HIP (WITH OR WITHOUT PELVIS) 1V PORT RIGHT COMPARISON:  05/07/2019 FINDINGS: IM nail with compression screw at proximal RIGHT femur post ORIF. Bones demineralized. No additional fracture, dislocation, or bone destruction. Degenerative disc and facet disease changes of visualized lower lumbar spine. IMPRESSION: Osseous demineralization post ORIF of proximal RIGHT femoral fracture. No new abnormalities or significant change since 05/07/2019. Electronically Signed   By: Ulyses Southward M.D.   On: 05/09/2019 09:57   DG HIP OPERATIVE UNILAT W OR W/O PELVIS RIGHT  Result Date: 05/07/2019 CLINICAL DATA:  Fracture fixation. Subtrochanteric fracture of the right hip. EXAM: OPERATIVE right HIP (WITH PELVIS IF PERFORMED) 6 VIEWS TECHNIQUE: Fluoroscopic spot image(s) were submitted for interpretation post-operatively. COMPARISON:  Radiographs 05/06/2019 FINDINGS: Multiple intraoperative fluoroscopic spot images demonstrate placement of a long intramedullary gamma nail and the proximal dynamic hip screw and single distal interlocking screw. Good position and alignment of the fracture with anatomic reduction. IMPRESSION: Anatomic reduction of the right hip fracture with internal fixation. Electronically Signed   By: Rudie Meyer M.D.   On: 05/07/2019 12:45   DG Hip Unilat W or Wo Pelvis 2-3 Views Right  Result Date: 05/06/2019 CLINICAL DATA:  Right hip pain post fall. EXAM: DG HIP (WITH OR WITHOUT PELVIS) 2-3V RIGHT COMPARISON:  None. FINDINGS: There is a transverse severely displaced and impacted subtrochanteric fracture of the proximal right femoral shaft. The right femoral head is normally located within the acetabulum. IMPRESSION: Transverse severely displaced and impacted subtrochanteric fracture of the proximal right femoral shaft. Electronically Signed   By: Ted Mcalpine M.D.   On: 05/06/2019 19:06   DG ESOPHAGUS W SINGLE CM (SOL OR THIN BA)  Result Date: 05/10/2019 CLINICAL DATA:  Difficulty swallowing with pain EXAM: ESOPHOGRAM/BARIUM SWALLOW TECHNIQUE: Single contrast examination was performed using  thin barium. FLUOROSCOPY TIME:  Fluoroscopy Time:  2 minutes Radiation Exposure Index (if provided by the fluoroscopic device): 26 mGy Number of Acquired Spot Images: Multiple cine fluoroscopic runs COMPARISON:  None. FINDINGS: Swallowing mechanism is within normal limits. There is normal esophageal transit proximally. No definitive mucosal  abnormality is seen. The degree of  motility in the mid to distal esophagus however is significantly decreased. Some extrinsic compression upon the distal esophagus is noted which may be related to the distal descending thoracic aorta or enlarged left atrium. This somewhat limits the passage of contrast material into the stomach although a true mechanical obstruction is not present. IMPRESSION: Decreased motility in the mid to distal esophagus. Some extrinsic compression upon the distal esophagus is noted likely related to prominent left atrium or the ectatic aorta. Electronically Signed   By: Alcide Clever M.D.   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 This measures up to 5.5 cm in the distal aortic arch. 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 Extensive mural plaque/thrombus in the distal descending thoracic aorta and upper abdominal aorta. No evidence of dissection. No leak.  1. TAA: Patient is currently being followed by Dr. Pattricia Boss at [facility].  Incidentally, this is the surgeon we would refer the patient as she has thoracic aneurysms.  Seems she sees him on a yearly basis.  Dr. Danae Orleans last note states to monitor /  control medically as the patient's COPD/lung issues do not make her an optimal candidate for surgery.  Agree with Dr. Pattricia Boss.  Patient's aneurysms are not not large enough to repair.  Recommend that she continue to follow-up with Dr. Pattricia Boss.  2.  Possible metastatic disease unknown primary: Oncology has been consulted  3.  Tobacco abuse: We had a discussion for approximately three minutes regarding the absolute need for smoking cessation due to the deleterious nature of tobacco on the vascular system. We discussed the tobacco use would diminish patency of any intervention, and likely significantly worsen progression of disease. We discussed multiple agents for quitting including replacement therapy or medications to reduce  cravings such as Chantix. The patient voices their understanding of the importance of smoking cessation.  Discussed with Dr. Weldon Inches, PA-C  05/26/2019 1:09 PM  This note was created with Dragon medical transcription system.  Any error is purely unintentional

## 2019-05-26 NOTE — H&P (Signed)
[facility] [place] NAME: Holly Shelton    MR#:  [id number]  DATE OF BIRTH:  06-27-1940  DATE OF ADMISSION:  05/26/2019  PRIMARY CARE PHYSICIAN: Patient, No Pcp Per   REQUESTING/REFERRING PHYSICIAN: Marjean Donna, MD  CHIEF COMPLAINT:   Chief Complaint  Patient presents with  . Chest Pain    HISTORY OF PRESENT ILLNESS:  Holly Shelton  is a 79 y.o. female with a known history of hypertension and thoracic aortic aneurysm, hypertension, ongoing tobacco abuse and COPD and recurrent UTI, who presented to the emergency room with acute onset of chest pain as well as abdominal pain and left arm swelling with mild pain without erythema and altered mental status.  She denied any fever or chills.  She has been having urinary frequency without dysuria or hematuria or flank pain.  She is a very poor historian due to altered mental status and somnolence.  She was recently admitted here for UTI sepsis on 4/15 and discharged 4/20.  She was also admitted on 05/06/2019 for right femoral fracture and underwent ORIF.  According to her daughter she has not been having them much appetite for quite some time and has been losing weight  Upon presentation to the emergency room, respiratory rate was 22 and later 23 with otherwise normal vital signs.  Labs revealed leukocytosis of 13.14 anemia better than previous levels.  CMP was remarkable for an albumin of 1.9 and alk phos of 488.  Total protein was 5.1.  BNP 146 and high-sensitivity troponin I was 10.  UA was positive for UTI.  Chest x-ray showed: 1. Low lung volumes. Grossly stable cardiomegaly with vascular congestion. 2. Chronic aortic tortuosity, unchanged allowing for differences in technique and positioning. 3. Probable small left pleural effusion. 4. Streaky right lung base opacity, favor atelectasis.  Chest CTA showed large thoracoabdominal aneurysm measuring up to 5.8 cm and proximal abdominal aorta which measures up to  5.5 cm in the distal aortic arch and 4.1 cm in the ascending thoracic aorta.  There was extensive mural plaque/thrombus in the distal descending and thoracic aorta and upper abdominal aorta with no evidence for dissection or leak.  8 also showed small to moderate bilateral pleural effusions with compressive atelectasis in both lower lobes.  There were bilateral hydronephrosis.  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  It showed nonobstructing right nephrolithiasis and retroperitoneal adenopathy with extensive mixed lytic and sclerotic lesions within the visualized skeleton most compatible with osseous metastatic disease of unknown primary.  Venous duplex of the left upper extremity revealed superficial venous thrombosis involving the midportion of the left cephalic vein with no DVT.  The patient was given a gram of IV Rocephin. She will be admitted to a medically monitored bed for further evaluation and management. PAST MEDICAL HISTORY:   Past Medical History:  Diagnosis Date  . Hypertension   UTI, sepsis, COPD, tobacco abuse and dyslipidemia  PAST SURGICAL HISTORY:   Past Surgical History:  Procedure Laterality Date  . INTRAMEDULLARY (IM) NAIL INTERTROCHANTERIC Right 05/07/2019   Procedure: INTRAMEDULLARY (IM) NAIL INTERTROCHANTRIC;  Surgeon: Hessie Knows, MD;  Location: [facility] ORS;  Service: Orthopedics;  Laterality: Right;    SOCIAL HISTORY:   Social History   Tobacco Use  . Smoking status: Current Every  Day Smoker  . Smokeless tobacco: Never Used  Substance Use Topics  . Alcohol use: Never    FAMILY HISTORY:  History reviewed. No pertinent family history.  DRUG ALLERGIES:   Allergies  Allergen Reactions  . Penicillins Rash    Did it involve swelling of the  face/tongue/throat, SOB, or low BP? No Did it involve sudden or severe rash/hives, skin peeling, or any reaction on the inside of your mouth or nose? Yes Did you need to seek medical attention at a hospital or doctor's office? No When did it last happen? If all above answers are "NO", may proceed with cephalosporin use.    REVIEW OF SYSTEMS:   ROS As per history of present illness. All pertinent systems were reviewed above. Constitutional,  HEENT, cardiovascular, respiratory, GI, GU, musculoskeletal, neuro, psychiatric, endocrine,  integumentary and hematologic systems were reviewed and are otherwise  negative/unremarkable except for positive findings mentioned above in the HPI.   MEDICATIONS AT HOME:   Prior to Admission medications   Medication Sig Start Date End Date Taking? Authorizing Provider  albuterol (VENTOLIN HFA) 108 (90 Base) MCG/ACT inhaler Inhale 2 puffs into the lungs every 6 (six) hours as needed.  04/10/19   [provider]  alum & mag hydroxide-simeth (MAALOX/MYLANTA) 200-200-20 MG/5ML suspension Take 30 mLs by mouth every 4 (four) hours as needed for indigestion. 05/11/19   Lorella Nimrod, MD  aspirin EC 81 MG tablet Take 81 mg by mouth at bedtime.    [provider]  B Complex-C (B-COMPLEX WITH VITAMIN C) tablet Take 1 tablet by mouth daily.    [provider]  bisacodyl (DULCOLAX) 10 MG suppository Place 1 suppository (10 mg total) rectally daily as needed for moderate constipation. 05/11/19   Lorella Nimrod, MD  Calcium 600-200 MG-UNIT tablet Take 1 tablet by mouth daily.    [provider]  calcium acetate, Phos Binder, (PHOSLYRA) 667 MG/5ML SOLN Take 667 mg by mouth in the morning and at bedtime.    [provider]  cetirizine (ZYRTEC) 10 MG tablet Take 10 mg by mouth daily.    [provider]  cholecalciferol (VITAMIN D3) 25 MCG (1000 UNIT) tablet Take 1,000 Units by mouth daily.    [provider]    docusate sodium (COLACE) 100 MG capsule Take 1 capsule (100 mg total) by mouth 2 (two) times daily. 05/11/19   Lorella Nimrod, MD  enoxaparin (LOVENOX) 40 MG/0.4ML injection Inject 0.4 mLs (40 mg total) into the skin daily for 14 days. 05/11/19 05/25/19  Lorella Nimrod, MD  feeding supplement, ENSURE ENLIVE, (ENSURE ENLIVE) LIQD Take 237 mLs by mouth 2 (two) times daily between meals. 05/11/19   Lorella Nimrod, MD  ferrous [id number] C-folic acid (TRINSICON / FOLTRIN) capsule Take 1 capsule by mouth 2 (two) times daily. 05/11/19   Lorella Nimrod, MD  midodrine (PROAMATINE) 10 MG tablet Take 1 tablet (10 mg total) by mouth 3 (three) times daily with meals. 05/22/19   Nolberto Hanlon, MD  Multiple Vitamin (MULTIVITAMIN WITH MINERALS) TABS tablet Take 1 tablet by mouth daily. 05/23/19   Nolberto Hanlon, MD  nicotine (NICODERM CQ - DOSED IN MG/24 HOURS) 14 mg/24hr patch Place 1 patch (14 mg total) onto the skin daily. 05/11/19   Lorella Nimrod, MD  omeprazole (PRILOSEC) 40 MG capsule Take 40 mg by mouth daily. 04/12/19   [provider]  oxybutynin (DITROPAN) 5 MG tablet Take 5 mg by mouth 3 (three) times daily. 04/10/19   [provider]  polyethylene glycol (MIRALAX / GLYCOLAX) 17 g packet Take 17 g by mouth daily. 05/23/19   Nolberto Hanlon, MD  pregabalin (LYRICA) 100 MG capsule Take 100 mg by mouth 3 (three) times daily. 04/25/19   [provider]  rosuvastatin (CRESTOR) 10 MG tablet Take 10 mg by mouth at bedtime. 04/12/19   [provider]  SPIRIVA HANDIHALER 18 MCG inhalation capsule Place 1 capsule into inhaler and inhale daily. 04/25/19   [provider]  Grant Ruts INHUB 500-50 MCG/DOSE AEPB Inhale 1 puff into the lungs 2 (two) times daily. 05/03/19   [provider]      VITAL SIGNS:  Blood pressure 110/67, pulse 89, temperature 98.1 F (36.7 C), temperature source Oral, resp. rate 20, height 5' (1.524 m), weight 71 kg, SpO2 95 %.  PHYSICAL EXAMINATION:   Physical Exam  GENERAL:  79 y.o.-year-old patient lying in the bed with no acute distress.  EYES: Pupils equal, round, reactive to light and accommodation. No scleral icterus. Extraocular muscles intact.  HEENT: Head atraumatic, normocephalic. Oropharynx and nasopharynx clear.  NECK:  Supple, no jugular venous distention. No thyroid enlargement, no tenderness.  LUNGS: Normal breath sounds bilaterally, no wheezing, rales,rhonchi or crepitation. No use of accessory muscles of respiration.  CARDIOVASCULAR: Regular rate and rhythm, S1, S2 normal. No murmurs, rubs, or gallops.  ABDOMEN: Soft, nondistended, nontender. Bowel sounds present. No organomegaly or mass.  EXTREMITIES: No pedal edema, cyanosis, or clubbing.  NEUROLOGIC: Cranial nerves II through XII are intact. Muscle strength 5/5 in all extremities. Sensation intact. Gait not checked.  PSYCHIATRIC: The patient is alert and oriented x 3.  Normal affect and good eye contact. SKIN: No obvious rash, lesion, or ulcer.   LABORATORY PANEL:   CBC Recent Labs  Lab 05/26/19 0103  WBC 13.4*  HGB 8.9*  HCT 27.6*  PLT 366   ------------------------------------------------------------------------------------------------------------------  Chemistries  Recent Labs  Lab 05/21/19 1219 05/21/19 1219 05/26/19 0103  NA 135   < > 137  K 3.8   < > 3.5  CL 100   < > 96*  CO2 28   < > 31  GLUCOSE 96   < > 99  BUN 12   < > 12  CREATININE 0.67   < > 0.72  CALCIUM 9.7   < > 10.0  AST 30  --   --   ALT 13  --   --   ALKPHOS 488*  --   --   BILITOT 1.1  --   --    < > = values in this interval not displayed.   ------------------------------------------------------------------------------------------------------------------  Cardiac Enzymes No results for input(s): TROPONINI in the last 168 hours. ------------------------------------------------------------------------------------------------------------------  RADIOLOGY:  DG Chest Port  1 View  Result Date: 05/26/2019 CLINICAL DATA:  Chest pain. Dyspnea. EXAM: PORTABLE CHEST 1 VIEW COMPARISON:  Radiograph 8 days ago 05/18/2019. FINDINGS: Low lung volumes. Stable cardiomegaly. Stable mediastinal contours with aortic tortuosity. Obscuration of left hemidiaphragm likely a small effusion. Streaky right lung base opacity. Vascular congestion. No visualized pneumothorax. No acute osseous abnormalities are seen. IMPRESSION: 1. Low lung volumes. Grossly stable cardiomegaly with vascular congestion. 2. Chronic aortic tortuosity, unchanged allowing for differences in technique and positioning. 3. Probable small left pleural effusion. 4. Streaky right lung base opacity, favor atelectasis. Electronically Signed   By: Keith Rake M.D.   On: 05/26/2019 01:36   CT Angio Chest/Abd/Pel for Dissection W and/or Wo Contrast  Result Date: 05/26/2019 CLINICAL DATA:  Chest, abdomen and  back pain EXAM: CT ANGIOGRAPHY CHEST, ABDOMEN AND PELVIS TECHNIQUE: Non-contrast CT of the chest was initially obtained. Multidetector CT imaging through the chest, abdomen and pelvis was performed using the standard protocol during bolus administration of intravenous contrast. Multiplanar reconstructed images and MIPs were obtained and reviewed to evaluate the vascular anatomy. CONTRAST:  178m OMNIPAQUE IOHEXOL 350 MG/ML SOLN COMPARISON:  None. FINDINGS: CTA CHEST FINDINGS Cardiovascular: Aneurysmal dilatation of the distal aortic arch measuring up to 5.3 cm. Descending thoracic aorta is dilated measuring 5.5 cm just above the aortic hiatus. Extensive mural plaque within the distal descending thoracic aorta. No dissection. Cardiomegaly. Ascending thoracic aorta mildly aneurysmal at 4.1 cm. Aortic atherosclerosis. No filling defects in the pulmonary arteries to suggest pulmonary emboli. Mediastinum/Nodes: Esophagus is fluid-filled. No mediastinal, hilar, or axillary adenopathy. Lungs/Pleura: Small to moderate bilateral pleural  effusions. Vascular congestion. Compressive atelectasis in the lungs bilaterally. Musculoskeletal: Chest wall soft tissues are unremarkable. Scoliosis. Diffuse degenerative changes in the thoracic spine. Sclerotic foci seen throughout the ribs and thoracic spine. Lytic expansile lesion within the posterior left 5th rib measures 15 mm. Numerous old and healing fractures, possibly pathologic. Lytic lesion within the right posterior 10th rib. Small sclerotic foci within the clavicles, humeral heads and scapula. Destructive lytic lesion within the left scapula. Review of the MIP images confirms the above findings. CTA ABDOMEN AND PELVIS FINDINGS VASCULAR Aorta: Markedly aneurysmal, measuring up to 5.8 cm in the proximal aorta, tapering to normal size above the bifurcation, 1.6 cm. Extensive mural plaque/thrombus within the proximal aorta. Celiac: Patent SMA: Patent Renals: Small bilaterally, patent. IMA: Patent Inflow: Atherosclerotic calcifications. Patent. No aneurysm or dissection. Veins: No obvious venous abnormality within the limitations of this arterial phase study. Review of the MIP images confirms the above findings. NON-VASCULAR Hepatobiliary: Layering gallstones within the gallbladder. No focal hepatic abnormality. Pancreas: No focal abnormality or ductal dilatation. Spleen: No focal abnormality.  Normal size. Adrenals/Urinary Tract: Bilateral hydronephrosis. 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 Left ureter is mildly prominent proximally then tapers to normal size. No visible obstructing process. 13 mm nonobstructing right midpole renal stone. Adrenal glands and urinary bladder unremarkable. Stomach/Bowel: Moderate stool burden in the colon. No evidence of bowel obstruction. Lymphatic: Retroperitoneal lymph node. Index left periaortic node has a short axis diameter of 15 mm. Separate adjacent left periaortic lymph node has a short axis diameter of 11 mm. Reproductive: Prior  hysterectomy.  No adnexal masses. Other: No free fluid or free air. Musculoskeletal: Extensive mixed lytic and sclerotic foci throughout the lumbar spine and bony pelvis concerning for bony metastatic disease. Review of the MIP images confirms the above findings. 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 This measures up to 5.5 cm in the distal aortic arch. 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 Extensive mural plaque/thrombus in the distal descending thoracic aorta and upper abdominal aorta. No evidence of dissection. No leak. Small to moderate bilateral pleural effusions. Compressive atelectasis in the lower lobes. Bilateral hydronephrosis. 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 On the left, the proximal ureter appears mildly prominent as well with possible wall enhancement. No visible obstructing stones. Cannot exclude urothelial lesion within the proximal left ureter. Nonobstructing right nephrolithiasis. Retroperitoneal adenopathy. Extensive mixed lytic and sclerotic lesions throughout the visualized skeleton most compatible with osseous metastatic disease. This is of unknown primary. Electronically Signed   By: KRolm BaptiseM.D.   On: 05/26/2019 02:35  IMPRESSION AND PLAN:   1.  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 -Patient will be admitted to a medical monitored bed. -Pain management will be provided. -An oncology consultation will be obtained. -I notified Dr. Tasia Catchings with the patient. -Vascular surgery consult to be obtained. -I notified Dr. Lucky Cowboy about the patient  2.  UTI. -We will continue the patient on IV Rocephin and follow urine culture and sensitivity.  3.  Left upper extremity superficial venous thrombosis -Pain management will be provided with NSAIDs.  4.  Chest pain, likely typical. -Follow serial troponin I.  5.   Hypertension. -Continue Norvasc and Inderal.  6.  Depression. -Wellbutrin SR will be continued.  7.  COPD without exacerbation. -We will place her on as needed duo nebs.  8.  Dyslipidemia. -Statin therapy will be resumed.  9.  Ongoing tobacco abuse. -She will need further counseling for smoking cessation when she is more alert.   10.  DVT prophylaxis. -Subcutaneous Lovenox.   All the records are reviewed and case discussed with ED provider. The plan of care was discussed in details with the patient and her daughter over the phone who answered for her husband who was on his way to the hospital.. I answered all questions.  She agreed to proceed with the above mentioned plan. Further management will depend upon hospital course.   CODE STATUS: Full code.  The patient refused to have discussion about CODE STATUS in the past per her daughter.  Status is: Inpatient  Remains inpatient appropriate because:Unsafe d/c plan, IV treatments appropriate due to intensity of illness or inability to take PO and Inpatient level of care appropriate due to severity of illness   Dispo: The patient is from: Home              Anticipated d/c is to: Home              Anticipated d/c date is: 2 days              Patient currently is not medically stable to d/c.   TOTAL TIME TAKING CARE OF THIS PATIENT: 60 minutes.    Christel Mormon M.D on 05/26/2019 at 4:06 AM  [facility]   From 7 PM-7 AM, contact night-coverage [email or web address]  CC: Primary care physician; Patient, No Pcp Per   Note: This dictation was prepared with Dragon dictation along with smaller phrase technology. Any transcriptional errors that result from this process are unintentional.

## 2019-05-26 NOTE — ED Provider Notes (Signed)
[facility] Emergency Department Provider Note  ____________________________________________   First MD Initiated Contact with Patient 05/26/19 [phone]     (approximate)  I have reviewed the triage vital signs and the nursing notes.  Level 5 caveat history review of system limited secondary due to altered mental status. HISTORY  Chief Complaint Chest Pain    HPI Holly Shelton is a 79 y.o. female    with previous history of thoracic aortic aneurysm hypertension COPD and recent hospital admission secondary to recurrent urinary tract infection presents to the emergency department via EMS due to acute onset of chest and abdominal pain, left arm swelling and altered mental status.        Past Medical History:  Diagnosis Date  . Hypertension     Patient Active Problem List   Diagnosis Date Noted  . Altered mental status   . Hypotension due to hypovolemia   . UTI (urinary tract infection) 05/18/2019  . Pre-op chest exam   . S/P right hip fracture   . Esophageal dysphagia   . Fall   . Closed displaced subtrochanteric fracture of right femur (HCC) 05/06/2019  . COPD (chronic obstructive pulmonary disease) (HCC) 05/06/2019  . Essential hypertension 05/06/2019  . Tobacco abuse 05/06/2019  . History of recurrent UTI (urinary tract infection) 05/06/2019  . GERD (gastroesophageal reflux disease) 05/06/2019  . Aneurysm of descending thoracic aorta (HCC) 05/06/2019  . Closed fracture of right hip (HCC) 05/06/2019    Past Surgical History:  Procedure Laterality Date  . INTRAMEDULLARY (IM) NAIL INTERTROCHANTERIC Right 05/07/2019   Procedure: INTRAMEDULLARY (IM) NAIL INTERTROCHANTRIC;  Surgeon: Kennedy Bucker, MD;  Location: [facility] ORS;  Service: Orthopedics;  Laterality: Right;    Prior to Admission medications   Medication Sig Start Date End Date Taking? Authorizing Provider  albuterol (VENTOLIN HFA) 108 (90 Base) MCG/ACT inhaler Inhale 2 puffs into the  lungs every 6 (six) hours as needed.  04/10/19  Yes [provider]  alum & mag hydroxide-simeth (MAALOX/MYLANTA) 200-200-20 MG/5ML suspension Take 30 mLs by mouth every 4 (four) hours as needed for indigestion. 05/11/19  Yes Arnetha Courser, MD  aspirin EC 81 MG tablet Take 81 mg by mouth at bedtime.   Yes [provider]  B Complex-C (B-COMPLEX WITH VITAMIN C) tablet Take 1 tablet by mouth daily.   Yes [provider]  bisacodyl (DULCOLAX) 10 MG suppository Place 1 suppository (10 mg total) rectally daily as needed for moderate constipation. 05/11/19  Yes Arnetha Courser, MD  Calcium 600-200 MG-UNIT tablet Take 1 tablet by mouth daily.   Yes [provider]  calcium acetate, Phos Binder, (PHOSLYRA) 667 MG/5ML SOLN Take 667 mg by mouth in the morning and at bedtime.   Yes [provider]  cetirizine (ZYRTEC) 10 MG tablet Take 10 mg by mouth daily.   Yes [provider]  cholecalciferol (VITAMIN D3) 25 MCG (1000 UNIT) tablet Take 1,000 Units by mouth daily.   Yes [provider]  docusate sodium (COLACE) 100 MG capsule Take 1 capsule (100 mg total) by mouth 2 (two) times daily. 05/11/19  Yes Arnetha Courser, MD  enoxaparin (LOVENOX) 40 MG/0.4ML injection Inject 0.4 mLs (40 mg total) into the skin daily for 14 days. Patient taking differently: Inject 40 mg into the skin daily. 05/24/2019-06/07/19 05/11/19 05/26/19 Yes Arnetha Courser, MD  ferrous fumarate-b12-vitamic C-folic acid (TRINSICON / FOLTRIN) capsule Take 1 capsule by mouth 2 (two) times daily. 05/11/19  Yes Arnetha Courser, MD  midodrine (  PROAMATINE) 10 MG tablet Take 1 tablet (10 mg total) by mouth 3 (three) times daily with meals. 05/22/19  Yes Lynn ItoAmery, Sahar, MD  Multiple Vitamin (MULTIVITAMIN WITH MINERALS) TABS tablet Take 1 tablet by mouth daily. 05/23/19  Yes Lynn ItoAmery, Sahar, MD  omeprazole (PRILOSEC) 40 MG capsule Take 40 mg by mouth daily. 04/12/19  Yes [provider]  oxybutynin (DITROPAN)  5 MG tablet Take 5 mg by mouth 3 (three) times daily. 04/10/19  Yes [provider]  polyethylene glycol (MIRALAX / GLYCOLAX) 17 g packet Take 17 g by mouth daily. 05/23/19  Yes Lynn ItoAmery, Sahar, MD  pregabalin (LYRICA) 100 MG capsule Take 100 mg by mouth 3 (three) times daily. 04/25/19  Yes [provider]  rosuvastatin (CRESTOR) 10 MG tablet Take 10 mg by mouth at bedtime. 04/12/19  Yes [provider]  SPIRIVA HANDIHALER 18 MCG inhalation capsule Place 1 capsule into inhaler and inhale daily. 04/25/19  Yes [provider]  Monte FantasiaWIXELA INHUB 500-50 MCG/DOSE AEPB Inhale 1 puff into the lungs 2 (two) times daily. 05/03/19  Yes [provider]  feeding supplement, ENSURE ENLIVE, (ENSURE ENLIVE) LIQD Take 237 mLs by mouth 2 (two) times daily between meals. 05/11/19   Arnetha CourserAmin, Sumayya, MD  nicotine (NICODERM CQ - DOSED IN MG/24 HOURS) 14 mg/24hr patch Place 1 patch (14 mg total) onto the skin daily. Patient not taking: Reported on 05/26/2019 05/11/19   Arnetha CourserAmin, Sumayya, MD    Allergies Penicillins  History reviewed. No pertinent family history.  Social History Social History   Tobacco Use  . Smoking status: Current Every Day Smoker  . Smokeless tobacco: Never Used  Substance Use Topics  . Alcohol use: Never  . Drug use: Never    Review of Systems Constitutional: No fever/chills Eyes: No visual changes. ENT: No sore throat. Cardiovascular: Positive for chest pain. Respiratory: Denies shortness of breath. Gastrointestinal: Positive for abdominal pain.  No nausea, no vomiting.  No diarrhea.  No constipation. Genitourinary: Negative for dysuria. Musculoskeletal: Negative for neck pain.  Negative for back pain. Integumentary: Negative for rash. Neurological: Negative for headaches, focal weakness or numbness.   ____________________________________________   PHYSICAL EXAM:  VITAL SIGNS: ED Triage Vitals  Enc Vitals Group     BP --      Pulse --      Resp --       Temp --      Temp src --      SpO2 05/26/19 0057 99 %     Weight 05/26/19 0059 71 kg (156 lb 8.4 oz)     Height 05/26/19 0059 1.524 m (5')     Head Circumference --      Peak Flow --      Pain Score 05/26/19 0058 9     Pain Loc --      Pain Edu? --      Excl. in GC? --     Constitutional: Alert and oriented to self Eyes: Conjunctivae are normal.  Head: Atraumatic. Mouth/Throat: Patient is wearing a mask. Neck: No stridor.  No meningeal signs.   Cardiovascular: Normal rate, regular rhythm. Good peripheral circulation. Grossly normal heart sounds. Respiratory: Normal respiratory effort.  No retractions. Gastrointestinal: Soft and nontender. No distention.  Musculoskeletal: Left arm swelling (nonpitting) palpable radial and ulnar pulses, cap refills less than 2 seconds. Neurologic:  Normal speech and language. No gross focal neurologic deficits are appreciated.  Skin:  Skin is warm, dry and intact. Psychiatric: Mood and affect  are normal. Speech and behavior are normal.  ____________________________________________   LABS (all labs ordered are listed, but only abnormal results are displayed)  Labs Reviewed  BASIC METABOLIC PANEL - Abnormal; Notable for the following components:      Result Value   Chloride 96 (*)    All other components within normal limits  CBC - Abnormal; Notable for the following components:   WBC 13.4 (*)    RBC 2.95 (*)    Hemoglobin 8.9 (*)    HCT 27.6 (*)    RDW 16.2 (*)    All other components within normal limits  BRAIN NATRIURETIC PEPTIDE - Abnormal; Notable for the following components:   B Natriuretic Peptide 146.0 (*)    All other components within normal limits  URINALYSIS, COMPLETE (UACMP) WITH MICROSCOPIC - Abnormal; Notable for the following components:   Color, Urine AMBER (*)    APPearance CLOUDY (*)    Hgb urine dipstick LARGE (*)    Ketones, ur 5 (*)    Protein, ur 100 (*)    Leukocytes,Ua LARGE (*)    RBC / HPF >50 (*)     WBC, UA >50 (*)    Bacteria, UA RARE (*)    Non Squamous Epithelial PRESENT (*)    All other components within normal limits  CBC - Abnormal; Notable for the following components:   WBC 11.2 (*)    RBC 2.46 (*)    Hemoglobin 7.4 (*)    HCT 23.6 (*)    RDW 16.0 (*)    All other components within normal limits  BASIC METABOLIC PANEL - Abnormal; Notable for the following components:   Potassium 3.4 (*)    CO2 33 (*)    All other components within normal limits  RESPIRATORY PANEL BY RT PCR (FLU A&B, COVID)  URINE CULTURE  SAMPLE TO BLOOD BANK  TROPONIN I (HIGH SENSITIVITY)  TROPONIN I (HIGH SENSITIVITY)  TROPONIN I (HIGH SENSITIVITY)   ____________________________________________  EKG ED ECG REPORT I, Richey N Yamila Cragin, the attending physician, personally viewed and interpreted this ECG.   Date: 05/26/2019  EKG Time: 1:01 AM  Rate: 93  Rhythm: Normal sinus rhythm with right bundle branch block  Axis: Normal  Intervals: Normal  ST&T Change: None  . ____________________________________________  RADIOLOGY I, [place] N Dustine Bertini, personally viewed and evaluated these images (plain radiographs) as part of my medical decision making, as well as reviewing the written report by the radiologist.    Official radiology report(s): US Venous Img Upper Uni Left  Addendum Date: 05/26/2019   ADDENDUM REPORT: 05/26/2019 04:49 ADDENDUM: Study discussed by telephone with Dr. Bayard Males on 05/26/2019 at 0438 hours. Electronically Signed   By: Odessa Fleming M.D.   On: 05/26/2019 04:49   Result Date: 05/26/2019 CLINICAL DATA:  79 year old female with left upper extremity swelling. Chest pain, shortness of breath. Thoracoabdominal aneurysm and mixed lytic and sclerotic skeletal lesions on CTA today. EXAM: LEFT UPPER EXTREMITY VENOUS DOPPLER ULTRASOUND TECHNIQUE: Gray-scale sonography with graded compression, as well as color Doppler and duplex ultrasound were performed to evaluate the upper extremity  deep venous system from the level of the subclavian vein and including the jugular, axillary, basilic, radial, ulnar and upper cephalic vein. Spectral Doppler was utilized to evaluate flow at rest and with distal augmentation maneuvers. COMPARISON:  CTA chest abdomen and pelvis earlier today. FINDINGS: Contralateral Subclavian Vein: Respiratory phasicity is normal and symmetric with the symptomatic side. No evidence of thrombus. Normal compressibility. Internal Jugular  Vein: No evidence of thrombus. Normal compressibility, respiratory phasicity and response to augmentation. Subclavian Vein: No evidence of thrombus. Normal compressibility, respiratory phasicity and response to augmentation. Axillary Vein: No evidence of thrombus. Normal compressibility, respiratory phasicity and response to augmentation. Cephalic Vein: There is a thrombosed segment of the distal cephalic vein (image 15) extending to just above the elbow (images 16 and 17). The more proximal cephalic vein appears normally compressible and patent (image 14). And the more distal cephalic vein appears at least partially patent. Basilic Vein: No evidence of thrombus. Normal compressibility, respiratory phasicity and response to augmentation. Brachial Veins: No evidence of thrombus. Normal compressibility, respiratory phasicity and response to augmentation. Radial Veins: No evidence of thrombus. Normal compressibility, respiratory phasicity and response to augmentation. Ulnar Veins: No evidence of thrombus. Normal compressibility, respiratory phasicity and response to augmentation. Other Findings:  None visualized. IMPRESSION: 1. Positive for left upper extremity superficial venous thrombosis involving the midportion of the left cephalic vein. 2. Negative for left upper extremity deep venous thrombosis. Electronically Signed: By: Genevie Ann M.D. On: 05/26/2019 04:32   DG Chest Port 1 View  Result Date: 05/26/2019 CLINICAL DATA:  Chest pain. Dyspnea. EXAM:  PORTABLE CHEST 1 VIEW COMPARISON:  Radiograph 8 days ago 05/18/2019. FINDINGS: Low lung volumes. Stable cardiomegaly. Stable mediastinal contours with aortic tortuosity. Obscuration of left hemidiaphragm likely a small effusion. Streaky right lung base opacity. Vascular congestion. No visualized pneumothorax. No acute osseous abnormalities are seen. IMPRESSION: 1. Low lung volumes. Grossly stable cardiomegaly with vascular congestion. 2. Chronic aortic tortuosity, unchanged allowing for differences in technique and positioning. 3. Probable small left pleural effusion. 4. Streaky right lung base opacity, favor atelectasis. Electronically Signed   By: Keith Rake M.D.   On: 05/26/2019 01:36   CT Angio Chest/Abd/Pel for Dissection W and/or Wo Contrast  Result Date: 05/26/2019 CLINICAL DATA:  Chest, abdomen and back pain EXAM: CT ANGIOGRAPHY CHEST, ABDOMEN AND PELVIS TECHNIQUE: Non-contrast CT of the chest was initially obtained. Multidetector CT imaging through the chest, abdomen and pelvis was performed using the standard protocol during bolus administration of intravenous contrast. Multiplanar reconstructed images and MIPs were obtained and reviewed to evaluate the vascular anatomy. CONTRAST:  12mL OMNIPAQUE IOHEXOL 350 MG/ML SOLN COMPARISON:  None. FINDINGS: CTA CHEST FINDINGS Cardiovascular: Aneurysmal dilatation of the distal aortic arch measuring up to 5.3 cm. Descending thoracic aorta is dilated measuring 5.5 cm just above the aortic hiatus. Extensive mural plaque within the distal descending thoracic aorta. No dissection. Cardiomegaly. Ascending thoracic aorta mildly aneurysmal at 4.1 cm. Aortic atherosclerosis. No filling defects in the pulmonary arteries to suggest pulmonary emboli. Mediastinum/Nodes: Esophagus is fluid-filled. No mediastinal, hilar, or axillary adenopathy. Lungs/Pleura: Small to moderate bilateral pleural effusions. Vascular congestion. Compressive atelectasis in the lungs  bilaterally. Musculoskeletal: Chest wall soft tissues are unremarkable. Scoliosis. Diffuse degenerative changes in the thoracic spine. Sclerotic foci seen throughout the ribs and thoracic spine. Lytic expansile lesion within the posterior left 5th rib measures 15 mm. Numerous old and healing fractures, possibly pathologic. Lytic lesion within the right posterior 10th rib. Small sclerotic foci within the clavicles, humeral heads and scapula. Destructive lytic lesion within the left scapula. Review of the MIP images confirms the above findings. CTA ABDOMEN AND PELVIS FINDINGS VASCULAR Aorta: Markedly aneurysmal, measuring up to 5.8 cm in the proximal aorta, tapering to normal size above the bifurcation, 1.6 cm. Extensive mural plaque/thrombus within the proximal aorta. Celiac: Patent SMA: Patent Renals: Small bilaterally, patent. IMA: Patent Inflow: Atherosclerotic  calcifications. Patent. No aneurysm or dissection. Veins: No obvious venous abnormality within the limitations of this arterial phase study. Review of the MIP images confirms the above findings. NON-VASCULAR Hepatobiliary: Layering gallstones within the gallbladder. No focal hepatic abnormality. Pancreas: No focal abnormality or ductal dilatation. Spleen: No focal abnormality.  Normal size. Adrenals/Urinary Tract: Bilateral hydronephrosis. 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 Left ureter is mildly prominent proximally then tapers to normal size. No visible obstructing process. 13 mm nonobstructing right midpole renal stone. Adrenal glands and urinary bladder unremarkable. Stomach/Bowel: Moderate stool burden in the colon. No evidence of bowel obstruction. Lymphatic: Retroperitoneal lymph node. Index left periaortic node has a short axis diameter of 15 mm. Separate adjacent left periaortic lymph node has a short axis diameter of 11 mm. Reproductive: Prior hysterectomy.  No adnexal masses. Other: No free fluid or free air.  Musculoskeletal: Extensive mixed lytic and sclerotic foci throughout the lumbar spine and bony pelvis concerning for bony metastatic disease. Review of the MIP images confirms the above findings. 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 This measures up to 5.5 cm in the distal aortic arch. 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 Extensive mural plaque/thrombus in the distal descending thoracic aorta and upper abdominal aorta. No evidence of dissection. No leak. Small to moderate bilateral pleural effusions. Compressive atelectasis in the lower lobes. Bilateral hydronephrosis. 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 On the left, the proximal ureter appears mildly prominent as well with possible wall enhancement. No visible obstructing stones. Cannot exclude urothelial lesion within the proximal left ureter. Nonobstructing right nephrolithiasis. Retroperitoneal adenopathy. Extensive mixed lytic and sclerotic lesions throughout the visualized skeleton most compatible with osseous metastatic disease. This is of unknown primary. Electronically Signed   By: Charlett Nose M.D.   On: 05/26/2019 02:35    ____________________________________________   PROCEDURES   Procedure(s) performed (including Critical Care):  .Critical Care Performed by: Darci Current, MD Authorized by: Darci Current, MD   Critical care provider statement:    Critical care time (minutes):  30   Critical care time was exclusive of:  Separately billable procedures and treating other patients   Critical care was necessary to treat or prevent imminent or life-threatening deterioration of the following conditions:  Sepsis   Critical care was time spent personally by me on the following activities:  Development of treatment plan with patient or surrogate, discussions with consultants, evaluation of patient's response to treatment, examination of  patient, obtaining history from patient or surrogate, ordering and performing treatments and interventions, ordering and review of laboratory studies, ordering and review of radiographic studies, pulse oximetry, re-evaluation of patient's condition and review of old charts     ____________________________________________   INITIAL IMPRESSION / MDM / ASSESSMENT AND PLAN / ED COURSE  As part of my medical decision making, I reviewed the following data within the electronic MEDICAL RECORD NUMBER  79 year old female presented with above-stated history and physical exam with differential diagnosis including but not limited to aortic dissection versus worsening aortic aneurysm, urinary tract infection, ACS pulmonary emboli.  Laboratory data revealed findings consistent with a urinary tract infection for which the patient was given IV ceftriaxone..  CT angiogram chest abdomen pelvis revealed large thoracoabdominal aneurysm without evidence of dissection.  Patient discussed with Dr. Wyn Quaker vascular surgeon on-call who will consult no emergent intervention at this time.  CT also revealed evidence of lytic bone lesions  possibly secondary to metastasis.  Regarding the patient's left arm swelling patient underwent a venous ultrasound that I discussed with Dr. Margo Aye which revealed evidence of a superficial thrombus in the midportion of the left cephalic vein.  Patient discussed with Dr. Burley Saver for hospital admission for further evaluation and management. ____________________________________________  FINAL CLINICAL IMPRESSION(S) / ED DIAGNOSES  Final diagnoses:  Acute cystitis with hematuria  Altered mental status, unspecified altered mental status type  Lytic bone lesions on xray  Superficial venous thrombosis of left arm  Thoracoabdominal aortic aneurysm   MEDICATIONS GIVEN DURING THIS VISIT:  Medications  aspirin EC tablet 81 mg (has no administration in time range)  midodrine (PROAMATINE) tablet 10 mg (has  no administration in time range)  rosuvastatin (CRESTOR) tablet 10 mg (has no administration in time range)  nicotine (NICODERM CQ - dosed in mg/24 hours) patch 14 mg (has no administration in time range)  alum & mag hydroxide-simeth (MAALOX/MYLANTA) 200-200-20 MG/5ML suspension 30 mL (has no administration in time range)  bisacodyl (DULCOLAX) suppository 10 mg (has no administration in time range)  calcium acetate (Phos Binder) (PHOSLYRA) 667 MG/5ML oral solution 667 mg (has no administration in time range)  docusate sodium (COLACE) capsule 100 mg (has no administration in time range)  pantoprazole (PROTONIX) EC tablet 40 mg (has no administration in time range)  polyethylene glycol (MIRALAX / GLYCOLAX) packet 17 g (has no administration in time range)  oxybutynin (DITROPAN) tablet 5 mg (has no administration in time range)  enoxaparin (LOVENOX) injection 40 mg (has no administration in time range)  ferrous fumarate-b12-vitamic C-folic acid (TRINSICON / FOLTRIN) capsule 1 capsule (has no administration in time range)  pregabalin (LYRICA) capsule 100 mg (has no administration in time range)  calcium-vitamin D (OSCAL WITH D) 500-200 MG-UNIT per tablet 1 tablet (has no administration in time range)  cholecalciferol (VITAMIN D3) tablet 1,000 Units (has no administration in time range)  feeding supplement (ENSURE ENLIVE) (ENSURE ENLIVE) liquid 237 mL (has no administration in time range)  multivitamin with minerals tablet 1 tablet (has no administration in time range)  albuterol (PROVENTIL) (2.5 MG/3ML) 0.083% nebulizer solution 2.5 mg (has no administration in time range)  loratadine (CLARITIN) tablet 10 mg (has no administration in time range)  tiotropium (SPIRIVA) inhalation capsule ([facility] use ONLY) 18 mcg (has no administration in time range)  mometasone-formoterol (DULERA) 200-5 MCG/ACT inhaler 2 puff (has no administration in time range)  0.9 %  sodium chloride infusion ( Intravenous Rate/Dose  Verify 05/26/19 2979)  acetaminophen (TYLENOL) tablet 650 mg (has no administration in time range)    Or  acetaminophen (TYLENOL) suppository 650 mg (has no administration in time range)  traZODone (DESYREL) tablet 25 mg (has no administration in time range)  magnesium hydroxide (MILK OF MAGNESIA) suspension 30 mL (has no administration in time range)  ondansetron (ZOFRAN) tablet 4 mg (has no administration in time range)    Or  ondansetron (ZOFRAN) injection 4 mg (has no administration in time range)  cefTRIAXone (ROCEPHIN) 1 g in sodium chloride 0.9 % 100 mL IVPB (has no administration in time range)  iohexol (OMNIPAQUE) 350 MG/ML injection 100 mL (100 mLs Intravenous Contrast Given 05/26/19 0203)  cefTRIAXone (ROCEPHIN) 1 g in sodium chloride 0.9 % 100 mL IVPB (0 g Intravenous Stopped 05/26/19 8921)     ED Discharge Orders    None      *Please note:  Holly Shelton was evaluated in Emergency Department on 05/26/2019 for the symptoms described in  the history of present illness. She was evaluated in the context of the global COVID-19 pandemic, which necessitated consideration that the patient might be at risk for infection with the SARS-CoV-2 virus that causes COVID-19. Institutional protocols and algorithms that pertain to the evaluation of patients at risk for COVID-19 are in a state of rapid change based on information released by regulatory bodies including the CDC and federal and state organizations. These policies and algorithms were followed during the patient's care in the ED.  Some ED evaluations and interventions may be delayed as a result of limited staffing during the pandemic.*  Note:  This document was prepared using Dragon voice recognition software and may include unintentional dictation errors.   Darci Current, MD 05/26/19 [phone]

## 2019-05-26 NOTE — ED Notes (Signed)
This RN unable to start antibiotic at this time due to ultrasound being at bedside. Will administer when ultrasound complete.

## 2019-05-26 NOTE — ED Triage Notes (Signed)
Pt arrived via [facility] from [facility] reports sharpchest pain radiates to back and left shoulder. Facility reports new onset of altered mental status. Left arm swelling noted, states she was discharged from hospital with this swelling. Hx of aortic aneurysm.

## 2019-05-26 NOTE — Progress Notes (Signed)
Morning medications administered per orders. Pt. Turned on L side and heels elevated. No additional needs noted at this time. Will continue to monitor.

## 2019-05-26 NOTE — Progress Notes (Signed)
Shift report received from Lupita Leash, [place]. Morning assessment completed. Pt. Resting in bed, arouses easily to name calling. Pt. Denies any pain or needs at this time. Will continue to monitor.

## 2019-05-26 NOTE — Progress Notes (Signed)
[facility]  - [place] at [facility]   PATIENT NAME: Holly Shelton    MR#:  [id number]  DATE OF BIRTH:  1940/04/08  SUBJECTIVE:   Patient came in from [facility] with altered mental status. She is awake asking for pain medication for her back and abdomen. Husband is at bedside. Patient is a poor historian. Poor appetite. Her third admission. Has not been able to participate in PT at [facility] given recurrent admissions in the hospital REVIEW OF SYSTEMS:   Review of Systems  Constitutional: Positive for malaise/fatigue. Negative for chills, fever and weight loss.  HENT: Negative for ear discharge, ear pain and nosebleeds.   Eyes: Negative for blurred vision, pain and discharge.  Respiratory: Negative for sputum production, shortness of breath, wheezing and stridor.   Cardiovascular: Negative for chest pain, palpitations, orthopnea and PND.  Gastrointestinal: Negative for abdominal pain, diarrhea, nausea and vomiting.  Genitourinary: Negative for frequency and urgency.  Musculoskeletal: Positive for back pain and joint pain.  Neurological: Positive for weakness. Negative for sensory change, speech change and focal weakness.  Psychiatric/Behavioral: Negative for depression and hallucinations. The patient is not nervous/anxious.    Tolerating Diet: very little Tolerating PT:   DRUG ALLERGIES:   Allergies  Allergen Reactions  . Penicillins Rash    Did it involve swelling of the face/tongue/throat, SOB, or low BP? No Did it involve sudden or severe rash/hives, skin peeling, or any reaction on the inside of your mouth or nose? Yes Did you need to seek medical attention at a hospital or doctor's office? No When did it last happen? If all above answers are "NO", may proceed with cephalosporin use.    VITALS:  Blood pressure (!) 111/49, pulse 85, temperature (!) 97.4 F (36.3 C), temperature source Axillary, resp. rate 19, height 5' (1.524 m), weight  78.2 kg, SpO2 96 %.  PHYSICAL EXAMINATION:   Physical Exam  GENERAL:  79 y.o.-year-old patient lying in the bed with no acute distress. Obese Anasarca+ EYES: Pupils equal, round, reactive to light and accommodation. No scleral icterus.   HEENT: Head atraumatic, normocephalic. Oropharynx and nasopharynx clear.  NECK:  Supple, no jugular venous distention. No thyroid enlargement, no tenderness.  LUNGS: Normal breath sounds bilaterally, no wheezing, rales, rhonchi. No use of accessory muscles of respiration.  CARDIOVASCULAR: S1, S2 normal. No murmurs, rubs, or gallops.  ABDOMEN: Soft, nontender, nondistended. Bowel sounds present. No organomegaly or mass.  EXTREMITIES: bilateral lower extremity chronic edema . Bilateral upper extremity edema NEUROLOGIC: Cranial nerves II through XII are intact. No focal Motor or sensory deficits b/l. Generalized deconditioning PSYCHIATRIC:  patient is alert and oriented x2  SKIN: No obvious rash, lesion, or ulcer.  Pressure Injury 05/26/19 Buttocks Left Deep Tissue Pressure Injury - Purple or maroon localized area of discolored intact skin or blood-filled blister due to damage of underlying soft tissue from pressure and/or shear. (Active)  05/26/19 0600  Location: Buttocks  Location Orientation: Left  Staging: Deep Tissue Pressure Injury - Purple or maroon localized area of discolored intact skin or blood-filled blister due to damage of underlying soft tissue from pressure and/or shear.  Wound Description (Comments):   Present on Admission: Yes    LABORATORY PANEL:  CBC Recent Labs  Lab 05/26/19 0424  WBC 11.2*  HGB 7.4*  HCT 23.6*  PLT 334    Chemistries  Recent Labs  Lab 05/21/19 1219 05/26/19 0424 05/26/19 0858  NA  --  138  --   K  --  3.4*  --   CL  --  98  --   CO2  --  33*  --   GLUCOSE  --  95  --   BUN  --  12  --   CREATININE  --  0.63  --   CALCIUM  --  9.8  --   AST   < >  --  40  ALT   < >  --  11  ALKPHOS   < >  --   664*  BILITOT   < >  --  0.9   < > = values in this interval not displayed.   Cardiac Enzymes No results for input(s): TROPONINI in the last 168 hours. RADIOLOGY:  CT Head Wo Contrast  Result Date: 05/26/2019 CLINICAL DATA:  Altered mental status. EXAM: CT HEAD WITHOUT CONTRAST TECHNIQUE: Contiguous axial images were obtained from the base of the skull through the vertex without intravenous contrast. COMPARISON:  None. FINDINGS: Brain: Age related cerebral atrophy, ventriculomegaly and periventricular white matter disease. No extra-axial fluid collections are identified. No CT findings for acute hemispheric infarction or intracranial hemorrhage. No mass lesions. The brainstem and cerebellum are normal. Vascular: Vascular calcifications but no aneurysm or hyperdense vessels. Skull: No skull fracture. Numerous small lucencies in the skull. Could not exclude metastatic disease or myeloma. Sinuses/Orbits: The paranasal sinuses and mastoid air cells are clear. Other: No scalp lesions or hematoma. IMPRESSION: 1. Age related cerebral atrophy, ventriculomegaly and periventricular white matter disease. 2. No acute intracranial findings or mass lesions. 3. Numerous small lucencies in the skull. Likely metastatic disease or myeloma. Electronically Signed   By: Marijo Sanes M.D.   On: 05/26/2019 07:27   US Venous Img Upper Uni Left  Addendum Date: 05/26/2019   ADDENDUM REPORT: 05/26/2019 04:49 ADDENDUM: Study discussed by telephone with Dr. Marjean Donna on 05/26/2019 at 0438 hours. Electronically Signed   By: Genevie Ann M.D.   On: 05/26/2019 04:49   Result Date: 05/26/2019 CLINICAL DATA:  79 year old female with left upper extremity swelling. Chest pain, shortness of breath. Thoracoabdominal aneurysm and mixed lytic and sclerotic skeletal lesions on CTA today. EXAM: LEFT UPPER EXTREMITY VENOUS DOPPLER ULTRASOUND TECHNIQUE: Gray-scale sonography with graded compression, as well as color Doppler and duplex  ultrasound were performed to evaluate the upper extremity deep venous system from the level of the subclavian vein and including the jugular, axillary, basilic, radial, ulnar and upper cephalic vein. Spectral Doppler was utilized to evaluate flow at rest and with distal augmentation maneuvers. COMPARISON:  CTA chest abdomen and pelvis earlier today. FINDINGS: Contralateral Subclavian Vein: Respiratory phasicity is normal and symmetric with the symptomatic side. No evidence of thrombus. Normal compressibility. Internal Jugular Vein: No evidence of thrombus. Normal compressibility, respiratory phasicity and response to augmentation. Subclavian Vein: No evidence of thrombus. Normal compressibility, respiratory phasicity and response to augmentation. Axillary Vein: No evidence of thrombus. Normal compressibility, respiratory phasicity and response to augmentation. Cephalic Vein: There is a thrombosed segment of the distal cephalic vein (image 15) extending to just above the elbow (images 16 and 17). The more proximal cephalic vein appears normally compressible and patent (image 14). And the more distal cephalic vein appears at least partially patent. Basilic Vein: No evidence of thrombus. Normal compressibility, respiratory phasicity and response to augmentation. Brachial Veins: No evidence of thrombus. Normal compressibility, respiratory phasicity and response to augmentation. Radial Veins: No evidence of thrombus. Normal compressibility, respiratory phasicity and response to augmentation. Ulnar Veins: No evidence of  thrombus. Normal compressibility, respiratory phasicity and response to augmentation. Other Findings:  None visualized. IMPRESSION: 1. Positive for left upper extremity superficial venous thrombosis involving the midportion of the left cephalic vein. 2. Negative for left upper extremity deep venous thrombosis. Electronically Signed: By: Odessa FlemingH  Hall M.D. On: 05/26/2019 04:32   DG Chest Port 1 View  Result  Date: 05/26/2019 CLINICAL DATA:  Chest pain. Dyspnea. EXAM: PORTABLE CHEST 1 VIEW COMPARISON:  Radiograph 8 days ago 05/18/2019. FINDINGS: Low lung volumes. Stable cardiomegaly. Stable mediastinal contours with aortic tortuosity. Obscuration of left hemidiaphragm likely a small effusion. Streaky right lung base opacity. Vascular congestion. No visualized pneumothorax. No acute osseous abnormalities are seen. IMPRESSION: 1. Low lung volumes. Grossly stable cardiomegaly with vascular congestion. 2. Chronic aortic tortuosity, unchanged allowing for differences in technique and positioning. 3. Probable small left pleural effusion. 4. Streaky right lung base opacity, favor atelectasis. Electronically Signed   By: Narda RutherfordMelanie  Sanford M.D.   On: 05/26/2019 01:36   CT Angio Chest/Abd/Pel for Dissection W and/or Wo Contrast  Result Date: 05/26/2019 CLINICAL DATA:  Chest, abdomen and back pain EXAM: CT ANGIOGRAPHY CHEST, ABDOMEN AND PELVIS TECHNIQUE: Non-contrast CT of the chest was initially obtained. Multidetector CT imaging through the chest, abdomen and pelvis was performed using the standard protocol during bolus administration of intravenous contrast. Multiplanar reconstructed images and MIPs were obtained and reviewed to evaluate the vascular anatomy. CONTRAST:  100mL OMNIPAQUE IOHEXOL 350 MG/ML SOLN COMPARISON:  None. FINDINGS: CTA CHEST FINDINGS Cardiovascular: Aneurysmal dilatation of the distal aortic arch measuring up to 5.3 cm. Descending thoracic aorta is dilated measuring 5.5 cm just above the aortic hiatus. Extensive mural plaque within the distal descending thoracic aorta. No dissection. Cardiomegaly. Ascending thoracic aorta mildly aneurysmal at 4.1 cm. Aortic atherosclerosis. No filling defects in the pulmonary arteries to suggest pulmonary emboli. Mediastinum/Nodes: Esophagus is fluid-filled. No mediastinal, hilar, or axillary adenopathy. Lungs/Pleura: Small to moderate bilateral pleural effusions.  Vascular congestion. Compressive atelectasis in the lungs bilaterally. Musculoskeletal: Chest wall soft tissues are unremarkable. Scoliosis. Diffuse degenerative changes in the thoracic spine. Sclerotic foci seen throughout the ribs and thoracic spine. Lytic expansile lesion within the posterior left 5th rib measures 15 mm. Numerous old and healing fractures, possibly pathologic. Lytic lesion within the right posterior 10th rib. Small sclerotic foci within the clavicles, humeral heads and scapula. Destructive lytic lesion within the left scapula. Review of the MIP images confirms the above findings. CTA ABDOMEN AND PELVIS FINDINGS VASCULAR Aorta: Markedly aneurysmal, measuring up to 5.8 cm in the proximal aorta, tapering to normal size above the bifurcation, 1.6 cm. Extensive mural plaque/thrombus within the proximal aorta. Celiac: Patent SMA: Patent Renals: Small bilaterally, patent. IMA: Patent Inflow: Atherosclerotic calcifications. Patent. No aneurysm or dissection. Veins: No obvious venous abnormality within the limitations of this arterial phase study. Review of the MIP images confirms the above findings. NON-VASCULAR Hepatobiliary: Layering gallstones within the gallbladder. No focal hepatic abnormality. Pancreas: No focal abnormality or ductal dilatation. Spleen: No focal abnormality.  Normal size. Adrenals/Urinary Tract: Bilateral hydronephrosis. 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 Left ureter is mildly prominent proximally then tapers to normal size. No visible obstructing process. 13 mm nonobstructing right midpole renal stone. Adrenal glands and urinary bladder unremarkable. Stomach/Bowel: Moderate stool burden in the colon. No evidence of bowel obstruction. Lymphatic: Retroperitoneal lymph node. Index left periaortic node has a short axis diameter of 15 mm. Separate adjacent left periaortic lymph node has a short axis diameter of 11 mm. Reproductive:  Prior hysterectomy.  No  adnexal masses. Other: No free fluid or free air. Musculoskeletal: Extensive mixed lytic and sclerotic foci throughout the lumbar spine and bony pelvis concerning for bony metastatic disease. Review of the MIP images confirms the above findings. 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 This measures up to 5.5 cm in the distal aortic arch. 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 Extensive mural plaque/thrombus in the distal descending thoracic aorta and upper abdominal aorta. No evidence of dissection. No leak. Small to moderate bilateral pleural effusions. Compressive atelectasis in the lower lobes. Bilateral hydronephrosis. 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 On the left, the proximal ureter appears mildly prominent as well with possible wall enhancement. No visible obstructing stones. Cannot exclude urothelial lesion within the proximal left ureter. Nonobstructing right nephrolithiasis. Retroperitoneal adenopathy. Extensive mixed lytic and sclerotic lesions throughout the visualized skeleton most compatible with osseous metastatic disease. This is of unknown primary. Electronically Signed   By: Charlett Nose M.D.   On: 05/26/2019 02:35   ASSESSMENT AND PLAN:  Prarthana Parlin  is a 79 y.o. female with a known history of hypertension and thoracic aortic aneurysm, hypertension, ongoing tobacco abuse and COPD and recurrent UTI, who presented to the emergency room with acute onset of chest pain as well as abdominal pain and left arm swelling with mild pain without erythema and altered mental status.   She has been having urinary frequency without dysuria or hematuria or flank pain. Patient is from [facility].  1.  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 Cathie Hoops  --Multiple  myeloma vs metastatic leisons -- myeloma panel pending -- peripheral smear-- blast or any other abnormal cells noted -- elevated alkaline phosphatase -Vascular surgery consult with Dr dew--pt has complex AAA and not a candidate for any surgery  2. Recurrent UTI. -We will continue the patient on IV Rocephin -- patient was just discharge two days ago with E. coli UTI that was pan sensitive.  3.  Left upper extremity superficial venous thrombosis -Pain management will be provided with NSAIDs, heating pad  4.  Chest pain, likely atypical. -Follow serial troponin I-- negative  5.  Hypertension. -Continue Norvasc and Inderal.  6.  Depression. -Wellbutrin SR will be continued.  7.  COPD without exacerbation. -  cont as needed duo nebs.  8.   anemia of chronic disease -continue to monitor. No active bleeding. Will transfuse as needed  9.  Ongoing tobacco abuse. -Patient advised smoking cessation.  10.  DVT prophylaxis. -Subcutaneous Lovenox.  11. Anasarca with severe hypoalbuminemia/FTT/repeated admissions for different reasons/deconditioned -appears poorly nourished with significant deconditioning after early April hip fracture surgery. This is patient's third admission in April. -Palliative care to see patient.  Procedures: Family communication : husband in the room Consults : oncology, vascular surgery, palliative care CODE STATUS: full DVT Prophylaxis : Lovenox  Status is: Inpatient  Remains inpatient appropriate because:Ongoing active pain requiring inpatient pain management, oncology evaluation for lytic lesions which are new, generalized deconditioning weakness, treatment for UTI.   Dispo: The patient is from: Home              Anticipated d/c is to: SNF              Anticipated d/c date is: > 3 days  Patient currently is not medically stable to d/c.         TOTAL TIME TAKING CARE OF THIS PATIENT: 35  minutes.  >50% time spent on  counselling and coordination of care  Note: This dictation was prepared with Dragon dictation along with smaller phrase technology. Any transcriptional errors that result from this process are unintentional.  Enedina Finner M.D    [facility]   CC: Primary care physician; Patient, No Pcp PerPatient ID: Rob Hickman, female   DOB: November 24, 1940, 79 y.o.   MRN: [id number]

## 2019-05-26 NOTE — Consult Note (Signed)
[place]  [phone] [phone]   Name: Holly Shelton Date: 05/26/2019 MRN: [id number]  DOB: [birth year]  Patient Care Team: Patient, No Pcp Per as PCP - General (General Practice)    REASON FOR CONSULTATION: Holly Shelton is a 79 y.o. female with multiple medical problems including COPD, hypertension, thoracic AAA ongoing tobacco abuse, and recurrent UTI.  Patient was hospitalized 05/07/2019-05/11/2019 with a right hip fracture status post ORIF.  She was rehospitalized 05/18/2019-05/23/2019 with sepsis from UTI.  She was readmitted 05/26/2019 with chest pain and altered mental status.  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  She was referred to palliative care to help address goals.  SOCIAL HISTORY:     reports that she has been smoking. She has never used smokeless tobacco. She reports that she does not drink alcohol or use drugs.   Patient is married and lives at home with her husband.  Most recently, she has been at [facility] for rehab.  They have 6 children.  Patient worked at [employer] in housekeeping.  ADVANCE DIRECTIVES:  Does not have  CODE STATUS: Full code  PAST MEDICAL HISTORY: Past Medical History:  Diagnosis Date  . Hypertension     PAST SURGICAL HISTORY:  Past Surgical History:  Procedure Laterality Date  . INTRAMEDULLARY (IM) NAIL INTERTROCHANTERIC Right 05/07/2019   Procedure: INTRAMEDULLARY (IM) NAIL INTERTROCHANTRIC;  Surgeon: Hessie Knows, MD;  Location: [facility] ORS;  Service: Orthopedics;  Laterality: Right;    HEMATOLOGY/ONCOLOGY HISTORY:  Oncology History   No history exists.    ALLERGIES:  is allergic to penicillins.  MEDICATIONS:  Current Facility-Administered Medications  Medication Dose Route Frequency Provider Last Rate Last Admin  . 0.9 %  sodium chloride infusion    Intravenous Continuous Fritzi Mandes, MD 75 mL/hr at 05/26/19 1519 Rate Change at 05/26/19 1519  . acetaminophen (TYLENOL) tablet 650 mg  650 mg Oral Q6H PRN Mansy, Jan A, MD   650 mg at 05/26/19 1020   Or  . acetaminophen (TYLENOL) suppository 650 mg  650 mg Rectal Q6H PRN Mansy, Jan A, MD      . albuterol (PROVENTIL) (2.5 MG/3ML) 0.083% nebulizer solution 2.5 mg  2.5 mg Inhalation Q6H PRN Mansy, Jan A, MD      . alum & mag hydroxide-simeth (MAALOX/MYLANTA) 200-200-20 MG/5ML suspension 30 mL  30 mL Oral Q4H PRN Mansy, Jan A, MD      . aspirin EC tablet 81 mg  81 mg Oral QHS Mansy, Jan A, MD      . bisacodyl (DULCOLAX) suppository 10 mg  10 mg Rectal Daily PRN Mansy, Jan A, MD      . calcium acetate (Phos Binder) (PHOSLYRA) 667 MG/5ML oral solution 667 mg  667 mg Oral BID WC Mansy, Jan A, MD   667 mg at 05/26/19 1301  . calcium-vitamin D (OSCAL WITH D) 500-200 MG-UNIT per tablet 1 tablet  1 tablet Oral Daily Mansy, Jan A, MD   1 tablet at 05/26/19 1018  . cephALEXin (KEFLEX) capsule 500 mg  500 mg Oral TID Fritzi Mandes, MD      . cholecalciferol (VITAMIN D3) tablet 1,000 Units  1,000 Units Oral Daily Mansy, Jan A, MD   1,000 Units at 05/26/19 1017  . docusate sodium (COLACE) capsule 100 mg  100 mg Oral BID Mansy, Jan A, MD   100 mg at  05/26/19 1018  . enoxaparin (LOVENOX) injection 40 mg  40 mg Subcutaneous Q24H Mansy, Jan A, MD      . feeding supplement (ENSURE ENLIVE) (ENSURE ENLIVE) liquid 237 mL  237 mL Oral BID BM Mansy, Jan A, MD   237 mL at 05/26/19 1301  . ferrous [id number] C-folic acid (TRINSICON / FOLTRIN) capsule 1 capsule  1 capsule Oral BID Mansy, Jan A, MD   1 capsule at 05/26/19 1022  . loratadine (CLARITIN) tablet 10 mg  10 mg Oral Daily Mansy, Jan A, MD   10 mg at 05/26/19 1018  . magnesium hydroxide (MILK OF MAGNESIA) suspension 30 mL  30 mL Oral Daily PRN Mansy, Jan A, MD      . midodrine (PROAMATINE) tablet 10 mg  10 mg Oral TID WC Mansy, Jan A, MD   10 mg at 05/26/19  1301  . mometasone-formoterol (DULERA) 200-5 MCG/ACT inhaler 2 puff  2 puff Inhalation BID Mansy, Jan A, MD   2 puff at 05/26/19 1023  . morphine 2 MG/ML injection 0.5 mg  0.5 mg Intravenous Q4H PRN Fritzi Mandes, MD   0.5 mg at 05/26/19 1519  . multivitamin with minerals tablet 1 tablet  1 tablet Oral Daily Mansy, Jan A, MD   1 tablet at 05/26/19 1018  . nicotine (NICODERM CQ - dosed in mg/24 hours) patch 14 mg  14 mg Transdermal Daily Mansy, Jan A, MD      . ondansetron [facility]) tablet 4 mg  4 mg Oral Q6H PRN Mansy, Jan A, MD       Or  . ondansetron [facility]) injection 4 mg  4 mg Intravenous Q6H PRN Mansy, Jan A, MD      . oxybutynin (DITROPAN) tablet 5 mg  5 mg Oral TID Mansy, Jan A, MD   5 mg at 05/26/19 1518  . oxyCODONE (Oxy IR/ROXICODONE) immediate release tablet 5 mg  5 mg Oral Q4H PRN Fritzi Mandes, MD      . pantoprazole (PROTONIX) EC tablet 40 mg  40 mg Oral Daily Mansy, Jan A, MD   40 mg at 05/26/19 1017  . polyethylene glycol (MIRALAX / GLYCOLAX) packet 17 g  17 g Oral Daily Mansy, Jan A, MD   17 g at 05/26/19 1021  . pregabalin (LYRICA) capsule 100 mg  100 mg Oral TID Mansy, Jan A, MD   100 mg at 05/26/19 1518  . rosuvastatin (CRESTOR) tablet 10 mg  10 mg Oral QHS Mansy, Jan A, MD      . tiotropium [facility]) inhalation capsule [facility] use ONLY) 18 mcg  1 capsule Inhalation Daily Mansy, Jan A, MD   18 mcg at 05/26/19 1022  . traZODone (DESYREL) tablet 25 mg  25 mg Oral QHS PRN Mansy, Jan A, MD        VITAL SIGNS: BP (!) 111/49 (BP Location: Left Arm)   Pulse 85   Temp (!) 97.4 F (36.3 C) (Axillary)   Resp 19   Ht 5' (1.524 m)   Wt 172 lb 6.4 oz (78.2 kg)   SpO2 96%   BMI 33.67 kg/m  Filed Weights   05/26/19 0059 05/26/19 0545  Weight: 156 lb 8.4 oz (71 kg) 172 lb 6.4 oz (78.2 kg)    Estimated body mass index is 33.67 kg/m as calculated from the following:   Height as of this encounter: 5' (1.524 m).   Weight as of this encounter: 172 lb 6.4 oz (78.2 kg).  LABS: CBC:  Component Value Date/Time   WBC 11.2 (H) 05/26/2019 0424   HGB 7.4 (L) 05/26/2019 0424   HGB 15.0 05/19/2013 1059   HCT 23.6 (L) 05/26/2019 0424   HCT 44.6 05/19/2013 1059   PLT 334 05/26/2019 0424   PLT 459 (H) 05/19/2013 1059   MCV 95.9 05/26/2019 0424   MCV 89 05/19/2013 1059   NEUTROABS 8.4 (H) 05/18/2019 1200   NEUTROABS 13.8 (H) 05/19/2013 1059   LYMPHSABS 1.2 05/18/2019 1200   LYMPHSABS 1.4 05/19/2013 1059   MONOABS 1.2 (H) 05/18/2019 1200   MONOABS 1.3 (H) 05/19/2013 1059   EOSABS 0.3 05/18/2019 1200   EOSABS 0.0 05/19/2013 1059   BASOSABS 0.1 05/18/2019 1200   BASOSABS 0.1 05/19/2013 1059   Comprehensive Metabolic Panel:    Component Value Date/Time   NA 138 05/26/2019 0424   NA 134 (L) 05/19/2013 1059   K 3.4 (L) 05/26/2019 0424   K 2.9 (L) 05/19/2013 1059   CL 98 05/26/2019 0424   CL 96 (L) 05/19/2013 1059   CO2 33 (H) 05/26/2019 0424   CO2 29 05/19/2013 1059   BUN 12 05/26/2019 0424   BUN 14 05/19/2013 1059   CREATININE 0.63 05/26/2019 0424   CREATININE 0.96 05/19/2013 1059   GLUCOSE 95 05/26/2019 0424   GLUCOSE 153 (H) 05/19/2013 1059   CALCIUM 9.8 05/26/2019 0424   CALCIUM 9.3 05/19/2013 1059   AST 40 05/26/2019 0858   AST 21 05/19/2013 1059   ALT 11 05/26/2019 0858   ALT 21 05/19/2013 1059   ALKPHOS 664 (H) 05/26/2019 0858   ALKPHOS 117 05/19/2013 1059   BILITOT 0.9 05/26/2019 0858   BILITOT 0.4 05/19/2013 1059   PROT 5.3 (L) 05/26/2019 0858   PROT 8.2 05/19/2013 1059   ALBUMIN 1.9 (L) 05/26/2019 0858   ALBUMIN 3.5 05/19/2013 1059    RADIOGRAPHIC STUDIES: DG Chest 1 View  Result Date: 05/06/2019 CLINICAL DATA:  Recent fall with chest pain, initial encounter EXAM: CHEST  1 VIEW COMPARISON:  05/19/2013 FINDINGS: Cardiac shadow is enlarged. Thoracic aorta is tortuous and accentuated by mild patient rotation. The lungs are clear bilaterally. No acute bony abnormality is seen. IMPRESSION: Cardiomegaly and tortuous aorta.  No acute abnormality is  seen. Electronically Signed   By: Inez Catalina M.D.   On: 05/06/2019 19:03   CT Head Wo Contrast  Result Date: 05/26/2019 CLINICAL DATA:  Altered mental status. EXAM: CT HEAD WITHOUT CONTRAST TECHNIQUE: Contiguous axial images were obtained from the base of the skull through the vertex without intravenous contrast. COMPARISON:  None. FINDINGS: Brain: Age related cerebral atrophy, ventriculomegaly and periventricular white matter disease. No extra-axial fluid collections are identified. No CT findings for acute hemispheric infarction or intracranial hemorrhage. No mass lesions. The brainstem and cerebellum are normal. Vascular: Vascular calcifications but no aneurysm or hyperdense vessels. Skull: No skull fracture. Numerous small lucencies in the skull. Could not exclude metastatic disease or myeloma. Sinuses/Orbits: The paranasal sinuses and mastoid air cells are clear. Other: No scalp lesions or hematoma. IMPRESSION: 1. Age related cerebral atrophy, ventriculomegaly and periventricular white matter disease. 2. No acute intracranial findings or mass lesions. 3. Numerous small lucencies in the skull. Likely metastatic disease or myeloma. Electronically Signed   By: Marijo Sanes M.D.   On: 05/26/2019 07:27   US Venous Img Upper Uni Left  Addendum Date: 05/26/2019   ADDENDUM REPORT: 05/26/2019 04:49 ADDENDUM: Study discussed by telephone with Dr. Marjean Donna on 05/26/2019 at 0438 hours. Electronically Signed   By:  Genevie Ann M.D.   On: 05/26/2019 04:49   Result Date: 05/26/2019 CLINICAL DATA:  79 year old female with left upper extremity swelling. Chest pain, shortness of breath. Thoracoabdominal aneurysm and mixed lytic and sclerotic skeletal lesions on CTA today. EXAM: LEFT UPPER EXTREMITY VENOUS DOPPLER ULTRASOUND TECHNIQUE: Gray-scale sonography with graded compression, as well as color Doppler and duplex ultrasound were performed to evaluate the upper extremity deep venous system from the level of the  subclavian vein and including the jugular, axillary, basilic, radial, ulnar and upper cephalic vein. Spectral Doppler was utilized to evaluate flow at rest and with distal augmentation maneuvers. COMPARISON:  CTA chest abdomen and pelvis earlier today. FINDINGS: Contralateral Subclavian Vein: Respiratory phasicity is normal and symmetric with the symptomatic side. No evidence of thrombus. Normal compressibility. Internal Jugular Vein: No evidence of thrombus. Normal compressibility, respiratory phasicity and response to augmentation. Subclavian Vein: No evidence of thrombus. Normal compressibility, respiratory phasicity and response to augmentation. Axillary Vein: No evidence of thrombus. Normal compressibility, respiratory phasicity and response to augmentation. Cephalic Vein: There is a thrombosed segment of the distal cephalic vein (image 15) extending to just above the elbow (images 16 and 17). The more proximal cephalic vein appears normally compressible and patent (image 14). And the more distal cephalic vein appears at least partially patent. Basilic Vein: No evidence of thrombus. Normal compressibility, respiratory phasicity and response to augmentation. Brachial Veins: No evidence of thrombus. Normal compressibility, respiratory phasicity and response to augmentation. Radial Veins: No evidence of thrombus. Normal compressibility, respiratory phasicity and response to augmentation. Ulnar Veins: No evidence of thrombus. Normal compressibility, respiratory phasicity and response to augmentation. Other Findings:  None visualized. IMPRESSION: 1. Positive for left upper extremity superficial venous thrombosis involving the midportion of the left cephalic vein. 2. Negative for left upper extremity deep venous thrombosis. Electronically Signed: By: Genevie Ann M.D. On: 05/26/2019 04:32   DG Chest Port 1 View  Result Date: 05/26/2019 CLINICAL DATA:  Chest pain. Dyspnea. EXAM: PORTABLE CHEST 1 VIEW COMPARISON:   Radiograph 8 days ago 05/18/2019. FINDINGS: Low lung volumes. Stable cardiomegaly. Stable mediastinal contours with aortic tortuosity. Obscuration of left hemidiaphragm likely a small effusion. Streaky right lung base opacity. Vascular congestion. No visualized pneumothorax. No acute osseous abnormalities are seen. IMPRESSION: 1. Low lung volumes. Grossly stable cardiomegaly with vascular congestion. 2. Chronic aortic tortuosity, unchanged allowing for differences in technique and positioning. 3. Probable small left pleural effusion. 4. Streaky right lung base opacity, favor atelectasis. Electronically Signed   By: Keith Rake M.D.   On: 05/26/2019 01:36   DG Chest Portable 1 View  Result Date: 05/18/2019 CLINICAL DATA:  Sepsis. EXAM: PORTABLE CHEST 1 VIEW COMPARISON:  May 06, 2019. FINDINGS: Stable cardiomegaly. No pneumothorax or pleural effusion is noted. Left lung is clear. Minimal right basilar subsegmental atelectasis is noted. Bony thorax is unremarkable. IMPRESSION: Minimal right basilar subsegmental atelectasis. Electronically Signed   By: Marijo Conception M.D.   On: 05/18/2019 12:28   ECHOCARDIOGRAM COMPLETE  Result Date: 05/08/2019    ECHOCARDIOGRAM REPORT   Patient Name:   MAXENE BYINGTON Date of Exam: 05/08/2019 Medical Rec #:  [id number]      Height:       60.0 in Accession #:    [id number]     Weight:       156.5 lb Date of Birth:  Dec 23, 1940      BSA:          1.682 m Patient Age:  78 years       BP:           87/57 mmHg Patient Gender: F              HR:           74 bpm. Exam Location:  [facility] Procedure: 2D Echo, Color Doppler and Cardiac Doppler Indications:     R07.9 Chest Pain  History:         Patient has no prior history of Echocardiogram examinations.                  Risk Factors:Hypertension.  Sonographer:     Charmayne Sheer RDCS (AE) Referring Phys:  [place] Diagnosing Phys: Kathlyn Sacramento MD  Sonographer Comments: Technically difficult study due to poor echo windows. TDS  due to inability to position pt due to broken hip. IMPRESSIONS  1. Left ventricular ejection fraction, by estimation, is 55 to 60%. The left ventricle has normal function. Left ventricular endocardial border not optimally defined to evaluate regional wall motion. There is mild left ventricular hypertrophy. Left ventricular diastolic parameters are consistent with Grade I diastolic dysfunction (impaired relaxation).  2. Right ventricular systolic function is normal. The right ventricular size is normal. Tricuspid regurgitation signal is inadequate for assessing PA pressure.  3. The mitral valve is normal in structure. No evidence of mitral valve regurgitation. No evidence of mitral stenosis.  4. The aortic valve is normal in structure. Aortic valve regurgitation is not visualized. No aortic stenosis is present.  5. The inferior vena cava is normal in size with greater than 50% respiratory variability, suggesting right atrial pressure of 3 mmHg. FINDINGS  Left Ventricle: Left ventricular ejection fraction, by estimation, is 55 to 60%. The left ventricle has normal function. Left ventricular endocardial border not optimally defined to evaluate regional wall motion. The left ventricular internal cavity size was normal in size. There is mild left ventricular hypertrophy. Left ventricular diastolic parameters are consistent with Grade I diastolic dysfunction (impaired relaxation). Right Ventricle: The right ventricular size is normal. No increase in right ventricular wall thickness. Right ventricular systolic function is normal. Tricuspid regurgitation signal is inadequate for assessing PA pressure. Left Atrium: Left atrial size was normal in size. Right Atrium: Right atrial size was normal in size. Pericardium: There is no evidence of pericardial effusion. Mitral Valve: The mitral valve is normal in structure. Normal mobility of the mitral valve leaflets. No evidence of mitral valve regurgitation. No evidence of mitral  valve stenosis. MV peak gradient, 3.2 mmHg. The mean mitral valve gradient is 1.0 mmHg. Tricuspid Valve: The tricuspid valve is normal in structure. Tricuspid valve regurgitation is trivial. No evidence of tricuspid stenosis. Aortic Valve: The aortic valve is normal in structure. Aortic valve regurgitation is not visualized. No aortic stenosis is present. Aortic valve mean gradient measures 4.0 mmHg. Aortic valve peak gradient measures 9.5 mmHg. Aortic valve area, by VTI measures 3.58 cm. Pulmonic Valve: The pulmonic valve was normal in structure. Pulmonic valve regurgitation is not visualized. No evidence of pulmonic stenosis. Aorta: The aortic root is normal in size and structure. Venous: The inferior vena cava is normal in size with greater than 50% respiratory variability, suggesting right atrial pressure of 3 mmHg. IAS/Shunts: No atrial level shunt detected by color flow Doppler.  LEFT VENTRICLE PLAX 2D LVIDd:         3.35 cm  Diastology LVIDs:         2.08 cm  LV  e' lateral:   6.09 cm/s LV PW:         0.83 cm  LV E/e' lateral: 8.9 LV IVS:        0.67 cm  LV e' medial:    6.42 cm/s LVOT diam:     2.40 cm  LV E/e' medial:  8.5 LV SV:         77 LV SV Index:   46 LVOT Area:     4.52 cm  LEFT ATRIUM           Index LA diam:      4.10 cm 2.44 cm/m LA Vol (A4C): 49.7 ml 29.55 ml/m  AORTIC VALVE                   PULMONIC VALVE AV Area (Vmax):    2.80 cm    PV Vmax:       1.05 m/s AV Area (Vmean):   3.42 cm    PV Vmean:      65.200 cm/s AV Area (VTI):     3.58 cm    PV VTI:        0.176 m AV Vmax:           154.00 cm/s PV Peak grad:  4.4 mmHg AV Vmean:          92.100 cm/s PV Mean grad:  2.0 mmHg AV VTI:            0.216 m AV Peak Grad:      9.5 mmHg AV Mean Grad:      4.0 mmHg LVOT Vmax:         95.30 cm/s LVOT Vmean:        69.600 cm/s LVOT VTI:          0.171 m LVOT/AV VTI ratio: 0.79  AORTA Ao Root diam: 3.40 cm MITRAL VALVE MV Area (PHT): 4.68 cm    SHUNTS MV Peak grad:  3.2 mmHg    Systemic VTI:  0.17 m  MV Mean grad:  1.0 mmHg    Systemic Diam: 2.40 cm MV Vmax:       0.89 m/s MV Vmean:      41.3 cm/s MV Decel Time: 162 msec MV E velocity: 54.30 cm/s MV A velocity: 79.50 cm/s MV E/A ratio:  0.68 Kathlyn Sacramento MD Electronically signed by Kathlyn Sacramento MD Signature Date/Time: 05/08/2019/4:50:52 PM    Final    CT Angio Chest/Abd/Pel for Dissection W and/or Wo Contrast  Result Date: 05/26/2019 CLINICAL DATA:  Chest, abdomen and back pain EXAM: CT ANGIOGRAPHY CHEST, ABDOMEN AND PELVIS TECHNIQUE: Non-contrast CT of the chest was initially obtained. Multidetector CT imaging through the chest, abdomen and pelvis was performed using the standard protocol during bolus administration of intravenous contrast. Multiplanar reconstructed images and MIPs were obtained and reviewed to evaluate the vascular anatomy. CONTRAST:  147m OMNIPAQUE IOHEXOL 350 MG/ML SOLN COMPARISON:  None. FINDINGS: CTA CHEST FINDINGS Cardiovascular: Aneurysmal dilatation of the distal aortic arch measuring up to 5.3 cm. Descending thoracic aorta is dilated measuring 5.5 cm just above the aortic hiatus. Extensive mural plaque within the distal descending thoracic aorta. No dissection. Cardiomegaly. Ascending thoracic aorta mildly aneurysmal at 4.1 cm. Aortic atherosclerosis. No filling defects in the pulmonary arteries to suggest pulmonary emboli. Mediastinum/Nodes: Esophagus is fluid-filled. No mediastinal, hilar, or axillary adenopathy. Lungs/Pleura: Small to moderate bilateral pleural effusions. Vascular congestion. Compressive atelectasis in the lungs bilaterally. Musculoskeletal: Chest wall soft tissues are unremarkable. Scoliosis. Diffuse degenerative  changes in the thoracic spine. Sclerotic foci seen throughout the ribs and thoracic spine. Lytic expansile lesion within the posterior left 5th rib measures 15 mm. Numerous old and healing fractures, possibly pathologic. Lytic lesion within the right posterior 10th rib. Small sclerotic foci within  the clavicles, humeral heads and scapula. Destructive lytic lesion within the left scapula. Review of the MIP images confirms the above findings. CTA ABDOMEN AND PELVIS FINDINGS VASCULAR Aorta: Markedly aneurysmal, measuring up to 5.8 cm in the proximal aorta, tapering to normal size above the bifurcation, 1.6 cm. Extensive mural plaque/thrombus within the proximal aorta. Celiac: Patent SMA: Patent Renals: Small bilaterally, patent. IMA: Patent Inflow: Atherosclerotic calcifications. Patent. No aneurysm or dissection. Veins: No obvious venous abnormality within the limitations of this arterial phase study. Review of the MIP images confirms the above findings. NON-VASCULAR Hepatobiliary: Layering gallstones within the gallbladder. No focal hepatic abnormality. Pancreas: No focal abnormality or ductal dilatation. Spleen: No focal abnormality.  Normal size. Adrenals/Urinary Tract: Bilateral hydronephrosis. 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 Left ureter is mildly prominent proximally then tapers to normal size. No visible obstructing process. 13 mm nonobstructing right midpole renal stone. Adrenal glands and urinary bladder unremarkable. Stomach/Bowel: Moderate stool burden in the colon. No evidence of bowel obstruction. Lymphatic: Retroperitoneal lymph node. Index left periaortic node has a short axis diameter of 15 mm. Separate adjacent left periaortic lymph node has a short axis diameter of 11 mm. Reproductive: Prior hysterectomy.  No adnexal masses. Other: No free fluid or free air. Musculoskeletal: Extensive mixed lytic and sclerotic foci throughout the lumbar spine and bony pelvis concerning for bony metastatic disease. Review of the MIP images confirms the above findings. 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 This measures up to 5.5 cm in the distal aortic arch. 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 Extensive  mural plaque/thrombus in the distal descending thoracic aorta and upper abdominal aorta. No evidence of dissection. No leak. Small to moderate bilateral pleural effusions. Compressive atelectasis in the lower lobes. Bilateral hydronephrosis. 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 On the left, the proximal ureter appears mildly prominent as well with possible wall enhancement. No visible obstructing stones. Cannot exclude urothelial lesion within the proximal left ureter. Nonobstructing right nephrolithiasis. Retroperitoneal adenopathy. Extensive mixed lytic and sclerotic lesions throughout the visualized skeleton most compatible with osseous metastatic disease. This is of unknown primary. Electronically Signed   By: Rolm Baptise M.D.   On: 05/26/2019 02:35   DG HIP PORT UNILAT WITH PELVIS 1V RIGHT  Result Date: 05/09/2019 CLINICAL DATA:  RIGHT hip pain, hip surgery on 05/07/2019 EXAM: DG HIP (WITH OR WITHOUT PELVIS) 1V PORT RIGHT COMPARISON:  05/07/2019 FINDINGS: IM nail with compression screw at proximal RIGHT femur post ORIF. Bones demineralized. No additional fracture, dislocation, or bone destruction. Degenerative disc and facet disease changes of visualized lower lumbar spine. IMPRESSION: Osseous demineralization post ORIF of proximal RIGHT femoral fracture. No new abnormalities or significant change since 05/07/2019. Electronically Signed   By: Lavonia Dana M.D.   On: 05/09/2019 09:57   DG HIP OPERATIVE UNILAT W OR W/O PELVIS RIGHT  Result Date: 05/07/2019 CLINICAL DATA:  Fracture fixation. Subtrochanteric fracture of the right hip. EXAM: OPERATIVE right HIP (WITH PELVIS IF PERFORMED) 6 VIEWS TECHNIQUE: Fluoroscopic spot image(s) were submitted for interpretation post-operatively. COMPARISON:  Radiographs 05/06/2019 FINDINGS: Multiple intraoperative fluoroscopic spot images demonstrate placement of a long intramedullary gamma nail  and the proximal dynamic hip screw and single distal  interlocking screw. Good position and alignment of the fracture with anatomic reduction. IMPRESSION: Anatomic reduction of the right hip fracture with internal fixation. Electronically Signed   By: Marijo Sanes M.D.   On: 05/07/2019 12:45   DG Hip Unilat W or Wo Pelvis 2-3 Views Right  Result Date: 05/06/2019 CLINICAL DATA:  Right hip pain post fall. EXAM: DG HIP (WITH OR WITHOUT PELVIS) 2-3V RIGHT COMPARISON:  None. FINDINGS: There is a transverse severely displaced and impacted subtrochanteric fracture of the proximal right femoral shaft. The right femoral head is normally located within the acetabulum. IMPRESSION: Transverse severely displaced and impacted subtrochanteric fracture of the proximal right femoral shaft. Electronically Signed   By: Fidela Salisbury M.D.   On: 05/06/2019 19:06   DG ESOPHAGUS W SINGLE CM (SOL OR THIN BA)  Result Date: 05/10/2019 CLINICAL DATA:  Difficulty swallowing with pain EXAM: ESOPHOGRAM/BARIUM SWALLOW TECHNIQUE: Single contrast examination was performed using  thin barium. FLUOROSCOPY TIME:  Fluoroscopy Time:  2 minutes Radiation Exposure Index (if provided by the fluoroscopic device): 26 mGy Number of Acquired Spot Images: Multiple cine fluoroscopic runs COMPARISON:  None. FINDINGS: Swallowing mechanism is within normal limits. There is normal esophageal transit proximally. No definitive mucosal abnormality is seen. The degree of motility in the mid to distal esophagus however is significantly decreased. Some extrinsic compression upon the distal esophagus is noted which may be related to the distal descending thoracic aorta or enlarged left atrium. This somewhat limits the passage of contrast material into the stomach although a true mechanical obstruction is not present. IMPRESSION: Decreased motility in the mid to distal esophagus. Some extrinsic compression upon the distal esophagus is noted likely related to prominent left atrium or the ectatic aorta.  Electronically Signed   By: Inez Catalina M.D.   On: 05/10/2019 16:26    PERFORMANCE STATUS (ECOG) : 4 - Bedbound  Review of Systems Unless otherwise noted, a complete review of systems is negative.  Physical Exam General: NAD, frail appearing Pulmonary: Unlabored Extremities: no edema, no joint deformities Skin: no rashes Neurological: Weakness but otherwise nonfocal  IMPRESSION: I met with patient and her husband to discuss goals.  Introduced palliative care services and attempted establish therapeutic rapport.  At baseline, patient was living at home and fully independent with her own care prior to her hip fracture.  She has since been at rehab but reports being unable to work with therapy due to frequent recurrent hospitalizations.  Patient met earlier today with medical oncology.  Patient was able to relate to me an understanding that she likely has metastatic cancer.  Both patient and husband verbalized a desire to pursue work-up and treatment if any options are available.  We discussed the possible limitation of treatment in the setting of poor performance status.  However, plan would be to follow-up in the cancer center to discuss options for work-up/treatment following her discharge from the hospital.  Patient says that she does not have advance directives, nor is she interested in establishing them at the present time.  We discussed CODE STATUS and she would like an attempt made at resuscitation.  We will plan to follow-up with her in the clinic following discharge from the hospital to continue conversations regarding goals.  PLAN: -Continue full scope/full code -Would recommend palliative care following her at rehab -Will plan follow-up in the clinic after discharge from hospital  Case and plan discussed with Dr. Tasia Catchings  Time Total: 30 minutes  Visit consisted of counseling and education dealing with the complex and emotionally intense issues of symptom management and  palliative care in the setting of serious and potentially life-threatening illness.Greater than 50%  of this time was spent counseling and coordinating care related to the above assessment and plan.  Signed by: Altha Harm, PhD, NP-C

## 2019-05-26 NOTE — Consult Note (Addendum)
Hematology/Oncology Consult note [facility] [phone] [phone]  Patient Care Team: Patient, No Pcp Per as PCP - General (General Practice)   Name of the patient: Holly Shelton  [id number]  10-31-1940   Date of visit: 05/26/19 REASON FOR COSULTATION:   History of presenting illness-  79 y.o. female with PMH listed at below including hypertension, aneurysm, tobacco use, COPD and recurrent UTI who presents to ER due to acute onset of chest pain as well as abdominal pain in the left arm swelling.  Denies any fever or chills.  Patient is a poor historian. She was recently admitted for UTI sepsis on 05/18/2019 and discharged on 05/30/2019. Patient's urine is positive for UTI.  CT chest angiogram showed large thoracoabdominal aneurysm, extensive mural plaque/thrombus in the distal descending thoracic aorta and upper abdominal aorta with no evidence of dissection or leak.  Small to moderate bilateral pleural effusions.  Compressive atelectasis in the lower lobes.  Bilateral hydronephrosis.  The proximal ureter appears mildly prominent as well as with possible wall enhancement cannot exclude urothelial lesion within the proximal left ureter.  Nonobstructing right nephrolithiasis.  Retroperitoneal adenopathy.  Extensive mixed lytic and sclerotic lesions throughout the visualized skeleton most compatible with osseous metastasis.  Unknown primary.  Heme-onc was consulted for further evaluation.  Venous duplex of left upper extremity revealed superficial venous thrombosis involving the midportion of the left cephalic vein without DVT.  Review of Systems  Unable to perform ROS: Acuity of condition  Constitutional: Positive for fatigue.  Respiratory: Positive for chest tightness and shortness of breath.   Gastrointestinal: Negative for abdominal pain and blood in stool.  Genitourinary: Negative for difficulty urinating.   Musculoskeletal: Positive for  arthralgias.  Skin: Negative for rash.  Neurological: Negative for headaches.  Hematological: Negative for adenopathy.    Allergies  Allergen Reactions  . Penicillins Rash    Did it involve swelling of the face/tongue/throat, SOB, or low BP? No Did it involve sudden or severe rash/hives, skin peeling, or any reaction on the inside of your mouth or nose? Yes Did you need to seek medical attention at a hospital or doctor's office? No When did it last happen? If all above answers are "NO", may proceed with cephalosporin use.    Patient Active Problem List   Diagnosis Date Noted  . Lytic bone lesions on xray   . Superficial venous thrombosis of left arm   . Palliative care encounter   . Altered mental status   . Hypotension due to hypovolemia   . UTI (urinary tract infection) 05/18/2019  . Pre-op chest exam   . S/P right hip fracture   . Esophageal dysphagia   . Fall   . Closed displaced subtrochanteric fracture of right femur ([place]) 05/06/2019  . COPD (chronic obstructive pulmonary disease) ([place]) 05/06/2019  . Essential hypertension 05/06/2019  . Tobacco abuse 05/06/2019  . History of recurrent UTI (urinary tract infection) 05/06/2019  . GERD (gastroesophageal reflux disease) 05/06/2019  . Aneurysm of descending thoracic aorta ([place]) 05/06/2019  . Closed fracture of right hip ([place]) 05/06/2019     Past Medical History:  Diagnosis Date  . Hypertension      Past Surgical History:  Procedure Laterality Date  . INTRAMEDULLARY (IM) NAIL INTERTROCHANTERIC Right 05/07/2019   Procedure: INTRAMEDULLARY (IM) NAIL INTERTROCHANTRIC;  Surgeon: Hessie Knows, MD;  Location: [facility] ORS;  Service: Orthopedics;  Laterality: Right;    Social History   Socioeconomic History  . Marital status: Married  Spouse name: Not on file  . Number of children: Not on file  . Years of education: Not on file  . Highest education level: Not on file  Occupational History  . Not on file    Tobacco Use  . Smoking status: Current Every Day Smoker  . Smokeless tobacco: Never Used  Substance and Sexual Activity  . Alcohol use: Never  . Drug use: Never  . Sexual activity: Not on file  Other Topics Concern  . Not on file  Social History Narrative  . Not on file   Social Determinants of Health   Financial Resource Strain:   . Difficulty of Paying Living Expenses:   Food Insecurity:   . Worried About Charity fundraiser in the Last Year:   . Arboriculturist in the Last Year:   Transportation Needs:   . Film/video editor (Medical):   Marland Kitchen Lack of Transportation (Non-Medical):   Physical Activity:   . Days of Exercise per Week:   . Minutes of Exercise per Session:   Stress:   . Feeling of Stress :   Social Connections:   . Frequency of Communication with Friends and Family:   . Frequency of Social Gatherings with Friends and Family:   . Attends Religious Services:   . Active Member of Clubs or Organizations:   . Attends Archivist Meetings:   Marland Kitchen Marital Status:   Intimate Partner Violence:   . Fear of Current or Ex-Partner:   . Emotionally Abused:   Marland Kitchen Physically Abused:   . Sexually Abused:      History reviewed. No pertinent family history.   Current Facility-Administered Medications:  .  0.9 %  sodium chloride infusion, , Intravenous, Continuous, Fritzi Mandes, MD, Last Rate: 75 mL/hr at 05/26/19 1817, New Bag at 05/26/19 1817 .  acetaminophen (TYLENOL) tablet 650 mg, 650 mg, Oral, Q6H PRN, 650 mg at 05/26/19 1811 **OR** acetaminophen (TYLENOL) suppository 650 mg, 650 mg, Rectal, Q6H PRN, Mansy, Jan A, MD .  albuterol (PROVENTIL) (2.5 MG/3ML) 0.083% nebulizer solution 2.5 mg, 2.5 mg, Inhalation, Q6H PRN, Mansy, Jan A, MD .  alum & mag hydroxide-simeth (MAALOX/MYLANTA) 200-200-20 MG/5ML suspension 30 mL, 30 mL, Oral, Q4H PRN, Mansy, Jan A, MD .  aspirin EC tablet 81 mg, 81 mg, Oral, QHS, Mansy, Jan A, MD .  bisacodyl (DULCOLAX) suppository 10 mg, 10  mg, Rectal, Daily PRN, Mansy, Jan A, MD .  calcium acetate (Phos Binder) (PHOSLYRA) 667 MG/5ML oral solution 667 mg, 667 mg, Oral, BID WC, Mansy, Jan A, MD, 667 mg at 05/26/19 1301 .  calcium-vitamin D (OSCAL WITH D) 500-200 MG-UNIT per tablet 1 tablet, 1 tablet, Oral, Daily, Mansy, Jan A, MD, 1 tablet at 05/26/19 1018 .  cephALEXin (KEFLEX) capsule 500 mg, 500 mg, Oral, TID, Fritzi Mandes, MD .  cholecalciferol (VITAMIN D3) tablet 1,000 Units, 1,000 Units, Oral, Daily, Mansy, Arvella Merles, MD, 1,000 Units at 05/26/19 1017 .  docusate sodium (COLACE) capsule 100 mg, 100 mg, Oral, BID, Mansy, Jan A, MD, 100 mg at 05/26/19 1018 .  enoxaparin (LOVENOX) injection 40 mg, 40 mg, Subcutaneous, Q24H, Mansy, Jan A, MD .  feeding supplement (ENSURE ENLIVE) (ENSURE ENLIVE) liquid 237 mL, 237 mL, Oral, BID BM, Mansy, Jan A, MD, 237 mL at 05/26/19 1301 .  ferrous HENIDPOE-U23-NTIRWER C-folic acid (TRINSICON / FOLTRIN) capsule 1 capsule, 1 capsule, Oral, BID, Mansy, Jan A, MD, 1 capsule at 05/26/19 1022 .  loratadine (CLARITIN) tablet 10  mg, 10 mg, Oral, Daily, Mansy, Jan A, MD, 10 mg at 05/26/19 1018 .  magnesium hydroxide (MILK OF MAGNESIA) suspension 30 mL, 30 mL, Oral, Daily PRN, Mansy, Jan A, MD .  midodrine (PROAMATINE) tablet 10 mg, 10 mg, Oral, TID WC, Mansy, Jan A, MD, 10 mg at 05/26/19 1811 .  mometasone-formoterol (DULERA) 200-5 MCG/ACT inhaler 2 puff, 2 puff, Inhalation, BID, Mansy, Jan A, MD, 2 puff at 05/26/19 1023 .  morphine 2 MG/ML injection 0.5 mg, 0.5 mg, Intravenous, Q4H PRN, Fritzi Mandes, MD, 0.5 mg at 05/26/19 1519 .  multivitamin with minerals tablet 1 tablet, 1 tablet, Oral, Daily, Mansy, Jan A, MD, 1 tablet at 05/26/19 1018 .  nicotine (NICODERM CQ - dosed in mg/24 hours) patch 14 mg, 14 mg, Transdermal, Daily, Mansy, Jan A, MD .  ondansetron (ZOFRAN) tablet 4 mg, 4 mg, Oral, Q6H PRN **OR** ondansetron (ZOFRAN) injection 4 mg, 4 mg, Intravenous, Q6H PRN, Mansy, Jan A, MD .  oxybutynin (DITROPAN)  tablet 5 mg, 5 mg, Oral, TID, Mansy, Jan A, MD, 5 mg at 05/26/19 1518 .  oxyCODONE (Oxy IR/ROXICODONE) immediate release tablet 5 mg, 5 mg, Oral, Q4H PRN, Fritzi Mandes, MD .  pantoprazole (PROTONIX) EC tablet 40 mg, 40 mg, Oral, Daily, Mansy, Jan A, MD, 40 mg at 05/26/19 1017 .  polyethylene glycol (MIRALAX / GLYCOLAX) packet 17 g, 17 g, Oral, Daily, Mansy, Jan A, MD, 17 g at 05/26/19 1021 .  pregabalin (LYRICA) capsule 100 mg, 100 mg, Oral, TID, Mansy, Jan A, MD, 100 mg at 05/26/19 1518 .  rosuvastatin (CRESTOR) tablet 10 mg, 10 mg, Oral, QHS, Mansy, Jan A, MD .  tiotropium [facility]) inhalation capsule ([facility] use ONLY) 18 mcg, 1 capsule, Inhalation, Daily, Mansy, Jan A, MD, 18 mcg at 05/26/19 1022 .  traZODone (DESYREL) tablet 25 mg, 25 mg, Oral, QHS PRN, Mansy, Arvella Merles, MD   Physical exam:  Vitals:   05/26/19 0433 05/26/19 0545 05/26/19 0830 05/26/19 1630  BP: 114/67  (!) 111/49 (!) 123/59  Pulse: 85   85  Resp: 20  19 (!) 21  Temp:   (!) 97.4 F (36.3 C) 98.3 F (36.8 C)  TempSrc:   Axillary Oral  SpO2: 99%  96% 99%  Weight:  172 lb 6.4 oz (78.2 kg)    Height:  5' (1.524 m)     Physical Exam  Constitutional: She is oriented to person, place, and time. No distress.  HENT:  Head: Normocephalic and atraumatic.  Mouth/Throat: No oropharyngeal exudate.  Eyes: Pupils are equal, round, and reactive to light. EOM are normal. No scleral icterus.  Cardiovascular: Normal rate and regular rhythm.  No murmur heard. Pulmonary/Chest: Effort normal. No respiratory distress.  Patient breathing via nasal cannula oxygen.  Abdominal: Soft. She exhibits no distension. There is no abdominal tenderness.  Musculoskeletal:        General: No edema. Normal range of motion.     Cervical back: Normal range of motion and neck supple.  Neurological: She is alert and oriented to person, place, and time.  Skin: Skin is warm and dry. She is not diaphoretic. No erythema.  Psychiatric: Affect normal.         CMP Latest Ref Rng & Units 05/26/2019  Glucose 70 - 99 mg/dL -  BUN 8 - 23 mg/dL -  Creatinine 0.44 - 1.00 mg/dL -  Sodium 135 - 145 mmol/L -  Potassium 3.5 - 5.1 mmol/L -  Chloride 98 - 111 mmol/L -  CO2 22 - 32 mmol/L -  Calcium 8.9 - 10.3 mg/dL -  Total Protein 6.5 - 8.1 g/dL 5.3(L)  Total Bilirubin 0.3 - 1.2 mg/dL 0.9  Alkaline Phos 38 - 126 U/L 664(H)  AST 15 - 41 U/L 40  ALT 0 - 44 U/L 11   CBC Latest Ref Rng & Units 05/26/2019  WBC 4.0 - 10.5 K/uL 11.2(H)  Hemoglobin 12.0 - 15.0 g/dL 7.4(L)  Hematocrit 36.0 - 46.0 % 23.6(L)  Platelets 150 - 400 K/uL 334    RADIOGRAPHIC STUDIES: I have personally reviewed the radiological images as listed and agreed with the findings in the report. DG Chest 1 View  Result Date: 05/06/2019 CLINICAL DATA:  Recent fall with chest pain, initial encounter EXAM: CHEST  1 VIEW COMPARISON:  05/19/2013 FINDINGS: Cardiac shadow is enlarged. Thoracic aorta is tortuous and accentuated by mild patient rotation. The lungs are clear bilaterally. No acute bony abnormality is seen. IMPRESSION: Cardiomegaly and tortuous aorta.  No acute abnormality is seen. Electronically Signed   By: Inez Catalina M.D.   On: 05/06/2019 19:03   CT Head Wo Contrast  Result Date: 05/26/2019 CLINICAL DATA:  Altered mental status. EXAM: CT HEAD WITHOUT CONTRAST TECHNIQUE: Contiguous axial images were obtained from the base of the skull through the vertex without intravenous contrast. COMPARISON:  None. FINDINGS: Brain: Age related cerebral atrophy, ventriculomegaly and periventricular white matter disease. No extra-axial fluid collections are identified. No CT findings for acute hemispheric infarction or intracranial hemorrhage. No mass lesions. The brainstem and cerebellum are normal. Vascular: Vascular calcifications but no aneurysm or hyperdense vessels. Skull: No skull fracture. Numerous small lucencies in the skull. Could not exclude metastatic disease or myeloma.  Sinuses/Orbits: The paranasal sinuses and mastoid air cells are clear. Other: No scalp lesions or hematoma. IMPRESSION: 1. Age related cerebral atrophy, ventriculomegaly and periventricular white matter disease. 2. No acute intracranial findings or mass lesions. 3. Numerous small lucencies in the skull. Likely metastatic disease or myeloma. Electronically Signed   By: Marijo Sanes M.D.   On: 05/26/2019 07:27   US Venous Img Upper Uni Left  Addendum Date: 05/26/2019   ADDENDUM REPORT: 05/26/2019 04:49 ADDENDUM: Study discussed by telephone with Dr. Marjean Donna on 05/26/2019 at 0438 hours. Electronically Signed   By: Genevie Ann M.D.   On: 05/26/2019 04:49   Result Date: 05/26/2019 CLINICAL DATA:  79 year old female with left upper extremity swelling. Chest pain, shortness of breath. Thoracoabdominal aneurysm and mixed lytic and sclerotic skeletal lesions on CTA today. EXAM: LEFT UPPER EXTREMITY VENOUS DOPPLER ULTRASOUND TECHNIQUE: Gray-scale sonography with graded compression, as well as color Doppler and duplex ultrasound were performed to evaluate the upper extremity deep venous system from the level of the subclavian vein and including the jugular, axillary, basilic, radial, ulnar and upper cephalic vein. Spectral Doppler was utilized to evaluate flow at rest and with distal augmentation maneuvers. COMPARISON:  CTA chest abdomen and pelvis earlier today. FINDINGS: Contralateral Subclavian Vein: Respiratory phasicity is normal and symmetric with the symptomatic side. No evidence of thrombus. Normal compressibility. Internal Jugular Vein: No evidence of thrombus. Normal compressibility, respiratory phasicity and response to augmentation. Subclavian Vein: No evidence of thrombus. Normal compressibility, respiratory phasicity and response to augmentation. Axillary Vein: No evidence of thrombus. Normal compressibility, respiratory phasicity and response to augmentation. Cephalic Vein: There is a thrombosed segment  of the distal cephalic vein (image 15) extending to just above the elbow (images 16 and 17). The more proximal cephalic vein appears  normally compressible and patent (image 14). And the more distal cephalic vein appears at least partially patent. Basilic Vein: No evidence of thrombus. Normal compressibility, respiratory phasicity and response to augmentation. Brachial Veins: No evidence of thrombus. Normal compressibility, respiratory phasicity and response to augmentation. Radial Veins: No evidence of thrombus. Normal compressibility, respiratory phasicity and response to augmentation. Ulnar Veins: No evidence of thrombus. Normal compressibility, respiratory phasicity and response to augmentation. Other Findings:  None visualized. IMPRESSION: 1. Positive for left upper extremity superficial venous thrombosis involving the midportion of the left cephalic vein. 2. Negative for left upper extremity deep venous thrombosis. Electronically Signed: By: Genevie Ann M.D. On: 05/26/2019 04:32   DG Chest Port 1 View  Result Date: 05/26/2019 CLINICAL DATA:  Chest pain. Dyspnea. EXAM: PORTABLE CHEST 1 VIEW COMPARISON:  Radiograph 8 days ago 05/18/2019. FINDINGS: Low lung volumes. Stable cardiomegaly. Stable mediastinal contours with aortic tortuosity. Obscuration of left hemidiaphragm likely a small effusion. Streaky right lung base opacity. Vascular congestion. No visualized pneumothorax. No acute osseous abnormalities are seen. IMPRESSION: 1. Low lung volumes. Grossly stable cardiomegaly with vascular congestion. 2. Chronic aortic tortuosity, unchanged allowing for differences in technique and positioning. 3. Probable small left pleural effusion. 4. Streaky right lung base opacity, favor atelectasis. Electronically Signed   By: Keith Rake M.D.   On: 05/26/2019 01:36   DG Chest Portable 1 View  Result Date: 05/18/2019 CLINICAL DATA:  Sepsis. EXAM: PORTABLE CHEST 1 VIEW COMPARISON:  May 06, 2019. FINDINGS: Stable  cardiomegaly. No pneumothorax or pleural effusion is noted. Left lung is clear. Minimal right basilar subsegmental atelectasis is noted. Bony thorax is unremarkable. IMPRESSION: Minimal right basilar subsegmental atelectasis. Electronically Signed   By: Marijo Conception M.D.   On: 05/18/2019 12:28   ECHOCARDIOGRAM COMPLETE  Result Date: 05/08/2019    ECHOCARDIOGRAM REPORT   Patient Name:   WELLS GERDEMAN Date of Exam: 05/08/2019 Medical Rec #:  [id number]      Height:       60.0 in Accession #:    [id number]     Weight:       156.5 lb Date of Birth:  [birth year]      BSA:          1.682 m Patient Age:    77 years       BP:           87/57 mmHg Patient Gender: F              HR:           74 bpm. Exam Location:  [facility] Procedure: 2D Echo, Color Doppler and Cardiac Doppler Indications:     R07.9 Chest Pain  History:         Patient has no prior history of Echocardiogram examinations.                  Risk Factors:Hypertension.  Sonographer:     Charmayne Sheer RDCS (AE) Referring Phys:  [place] Diagnosing Phys: Kathlyn Sacramento MD  Sonographer Comments: Technically difficult study due to poor echo windows. TDS due to inability to position pt due to broken hip. IMPRESSIONS  1. Left ventricular ejection fraction, by estimation, is 55 to 60%. The left ventricle has normal function. Left ventricular endocardial border not optimally defined to evaluate regional wall motion. There is mild left ventricular hypertrophy. Left ventricular diastolic parameters are consistent with Grade I diastolic dysfunction (impaired relaxation).  2. Right ventricular systolic function is  normal. The right ventricular size is normal. Tricuspid regurgitation signal is inadequate for assessing PA pressure.  3. The mitral valve is normal in structure. No evidence of mitral valve regurgitation. No evidence of mitral stenosis.  4. The aortic valve is normal in structure. Aortic valve regurgitation is not visualized. No aortic stenosis is present.   5. The inferior vena cava is normal in size with greater than 50% respiratory variability, suggesting right atrial pressure of 3 mmHg. FINDINGS  Left Ventricle: Left ventricular ejection fraction, by estimation, is 55 to 60%. The left ventricle has normal function. Left ventricular endocardial border not optimally defined to evaluate regional wall motion. The left ventricular internal cavity size was normal in size. There is mild left ventricular hypertrophy. Left ventricular diastolic parameters are consistent with Grade I diastolic dysfunction (impaired relaxation). Right Ventricle: The right ventricular size is normal. No increase in right ventricular wall thickness. Right ventricular systolic function is normal. Tricuspid regurgitation signal is inadequate for assessing PA pressure. Left Atrium: Left atrial size was normal in size. Right Atrium: Right atrial size was normal in size. Pericardium: There is no evidence of pericardial effusion. Mitral Valve: The mitral valve is normal in structure. Normal mobility of the mitral valve leaflets. No evidence of mitral valve regurgitation. No evidence of mitral valve stenosis. MV peak gradient, 3.2 mmHg. The mean mitral valve gradient is 1.0 mmHg. Tricuspid Valve: The tricuspid valve is normal in structure. Tricuspid valve regurgitation is trivial. No evidence of tricuspid stenosis. Aortic Valve: The aortic valve is normal in structure. Aortic valve regurgitation is not visualized. No aortic stenosis is present. Aortic valve mean gradient measures 4.0 mmHg. Aortic valve peak gradient measures 9.5 mmHg. Aortic valve area, by VTI measures 3.58 cm. Pulmonic Valve: The pulmonic valve was normal in structure. Pulmonic valve regurgitation is not visualized. No evidence of pulmonic stenosis. Aorta: The aortic root is normal in size and structure. Venous: The inferior vena cava is normal in size with greater than 50% respiratory variability, suggesting right atrial pressure of  3 mmHg. IAS/Shunts: No atrial level shunt detected by color flow Doppler.  LEFT VENTRICLE PLAX 2D LVIDd:         3.35 cm  Diastology LVIDs:         2.08 cm  LV e' lateral:   6.09 cm/s LV PW:         0.83 cm  LV E/e' lateral: 8.9 LV IVS:        0.67 cm  LV e' medial:    6.42 cm/s LVOT diam:     2.40 cm  LV E/e' medial:  8.5 LV SV:         77 LV SV Index:   46 LVOT Area:     4.52 cm  LEFT ATRIUM           Index LA diam:      4.10 cm 2.44 cm/m LA Vol (A4C): 49.7 ml 29.55 ml/m  AORTIC VALVE                   PULMONIC VALVE AV Area (Vmax):    2.80 cm    PV Vmax:       1.05 m/s AV Area (Vmean):   3.42 cm    PV Vmean:      65.200 cm/s AV Area (VTI):     3.58 cm    PV VTI:        0.176 m AV Vmax:  154.00 cm/s PV Peak grad:  4.4 mmHg AV Vmean:          92.100 cm/s PV Mean grad:  2.0 mmHg AV VTI:            0.216 m AV Peak Grad:      9.5 mmHg AV Mean Grad:      4.0 mmHg LVOT Vmax:         95.30 cm/s LVOT Vmean:        69.600 cm/s LVOT VTI:          0.171 m LVOT/AV VTI ratio: 0.79  AORTA Ao Root diam: 3.40 cm MITRAL VALVE MV Area (PHT): 4.68 cm    SHUNTS MV Peak grad:  3.2 mmHg    Systemic VTI:  0.17 m MV Mean grad:  1.0 mmHg    Systemic Diam: 2.40 cm MV Vmax:       0.89 m/s MV Vmean:      41.3 cm/s MV Decel Time: 162 msec MV E velocity: 54.30 cm/s MV A velocity: 79.50 cm/s MV E/A ratio:  0.68 Kathlyn Sacramento MD Electronically signed by Kathlyn Sacramento MD Signature Date/Time: 05/08/2019/4:50:52 PM    Final    CT Angio Chest/Abd/Pel for Dissection W and/or Wo Contrast  Result Date: 05/26/2019 CLINICAL DATA:  Chest, abdomen and back pain EXAM: CT ANGIOGRAPHY CHEST, ABDOMEN AND PELVIS TECHNIQUE: Non-contrast CT of the chest was initially obtained. Multidetector CT imaging through the chest, abdomen and pelvis was performed using the standard protocol during bolus administration of intravenous contrast. Multiplanar reconstructed images and MIPs were obtained and reviewed to evaluate the vascular anatomy. CONTRAST:   150m OMNIPAQUE IOHEXOL 350 MG/ML SOLN COMPARISON:  None. FINDINGS: CTA CHEST FINDINGS Cardiovascular: Aneurysmal dilatation of the distal aortic arch measuring up to 5.3 cm. Descending thoracic aorta is dilated measuring 5.5 cm just above the aortic hiatus. Extensive mural plaque within the distal descending thoracic aorta. No dissection. Cardiomegaly. Ascending thoracic aorta mildly aneurysmal at 4.1 cm. Aortic atherosclerosis. No filling defects in the pulmonary arteries to suggest pulmonary emboli. Mediastinum/Nodes: Esophagus is fluid-filled. No mediastinal, hilar, or axillary adenopathy. Lungs/Pleura: Small to moderate bilateral pleural effusions. Vascular congestion. Compressive atelectasis in the lungs bilaterally. Musculoskeletal: Chest wall soft tissues are unremarkable. Scoliosis. Diffuse degenerative changes in the thoracic spine. Sclerotic foci seen throughout the ribs and thoracic spine. Lytic expansile lesion within the posterior left 5th rib measures 15 mm. Numerous old and healing fractures, possibly pathologic. Lytic lesion within the right posterior 10th rib. Small sclerotic foci within the clavicles, humeral heads and scapula. Destructive lytic lesion within the left scapula. Review of the MIP images confirms the above findings. CTA ABDOMEN AND PELVIS FINDINGS VASCULAR Aorta: Markedly aneurysmal, measuring up to 5.8 cm in the proximal aorta, tapering to normal size above the bifurcation, 1.6 cm. Extensive mural plaque/thrombus within the proximal aorta. Celiac: Patent SMA: Patent Renals: Small bilaterally, patent. IMA: Patent Inflow: Atherosclerotic calcifications. Patent. No aneurysm or dissection. Veins: No obvious venous abnormality within the limitations of this arterial phase study. Review of the MIP images confirms the above findings. NON-VASCULAR Hepatobiliary: Layering gallstones within the gallbladder. No focal hepatic abnormality. Pancreas: No focal abnormality or ductal dilatation.  Spleen: No focal abnormality.  Normal size. Adrenals/Urinary Tract: Bilateral hydronephrosis. 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 Left ureter is mildly prominent proximally then tapers to normal size. No visible obstructing process. 13 mm nonobstructing right midpole renal stone. Adrenal glands and urinary bladder unremarkable. Stomach/Bowel: Moderate stool burden in the  colon. No evidence of bowel obstruction. Lymphatic: Retroperitoneal lymph node. Index left periaortic node has a short axis diameter of 15 mm. Separate adjacent left periaortic lymph node has a short axis diameter of 11 mm. Reproductive: Prior hysterectomy.  No adnexal masses. Other: No free fluid or free air. Musculoskeletal: Extensive mixed lytic and sclerotic foci throughout the lumbar spine and bony pelvis concerning for bony metastatic disease. Review of the MIP images confirms the above findings. 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 This measures up to 5.5 cm in the distal aortic arch. 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 Extensive mural plaque/thrombus in the distal descending thoracic aorta and upper abdominal aorta. No evidence of dissection. No leak. Small to moderate bilateral pleural effusions. Compressive atelectasis in the lower lobes. Bilateral hydronephrosis. 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 On the left, the proximal ureter appears mildly prominent as well with possible wall enhancement. No visible obstructing stones. Cannot exclude urothelial lesion within the proximal left ureter. Nonobstructing right nephrolithiasis. Retroperitoneal adenopathy. Extensive mixed lytic and sclerotic lesions throughout the visualized skeleton most compatible with osseous metastatic disease. This is of unknown primary. Electronically Signed   By: Rolm Baptise M.D.   On: 05/26/2019 02:35   DG HIP PORT UNILAT WITH PELVIS  1V RIGHT  Result Date: 05/09/2019 CLINICAL DATA:  RIGHT hip pain, hip surgery on 05/07/2019 EXAM: DG HIP (WITH OR WITHOUT PELVIS) 1V PORT RIGHT COMPARISON:  05/07/2019 FINDINGS: IM nail with compression screw at proximal RIGHT femur post ORIF. Bones demineralized. No additional fracture, dislocation, or bone destruction. Degenerative disc and facet disease changes of visualized lower lumbar spine. IMPRESSION: Osseous demineralization post ORIF of proximal RIGHT femoral fracture. No new abnormalities or significant change since 05/07/2019. Electronically Signed   By: Lavonia Dana M.D.   On: 05/09/2019 09:57   DG HIP OPERATIVE UNILAT W OR W/O PELVIS RIGHT  Result Date: 05/07/2019 CLINICAL DATA:  Fracture fixation. Subtrochanteric fracture of the right hip. EXAM: OPERATIVE right HIP (WITH PELVIS IF PERFORMED) 6 VIEWS TECHNIQUE: Fluoroscopic spot image(s) were submitted for interpretation post-operatively. COMPARISON:  Radiographs 05/06/2019 FINDINGS: Multiple intraoperative fluoroscopic spot images demonstrate placement of a long intramedullary gamma nail and the proximal dynamic hip screw and single distal interlocking screw. Good position and alignment of the fracture with anatomic reduction. IMPRESSION: Anatomic reduction of the right hip fracture with internal fixation. Electronically Signed   By: Marijo Sanes M.D.   On: 05/07/2019 12:45   DG Hip Unilat W or Wo Pelvis 2-3 Views Right  Result Date: 05/06/2019 CLINICAL DATA:  Right hip pain post fall. EXAM: DG HIP (WITH OR WITHOUT PELVIS) 2-3V RIGHT COMPARISON:  None. FINDINGS: There is a transverse severely displaced and impacted subtrochanteric fracture of the proximal right femoral shaft. The right femoral head is normally located within the acetabulum. IMPRESSION: Transverse severely displaced and impacted subtrochanteric fracture of the proximal right femoral shaft. Electronically Signed   By: Fidela Salisbury M.D.   On: 05/06/2019 19:06   DG  ESOPHAGUS W SINGLE CM (SOL OR THIN BA)  Result Date: 05/10/2019 CLINICAL DATA:  Difficulty swallowing with pain EXAM: ESOPHOGRAM/BARIUM SWALLOW TECHNIQUE: Single contrast examination was performed using  thin barium. FLUOROSCOPY TIME:  Fluoroscopy Time:  2 minutes Radiation Exposure Index (if provided by the fluoroscopic device): 26 mGy Number of Acquired Spot Images: Multiple cine fluoroscopic runs COMPARISON:  None. FINDINGS: Swallowing mechanism is within normal limits. There  is normal esophageal transit proximally. No definitive mucosal abnormality is seen. The degree of motility in the mid to distal esophagus however is significantly decreased. Some extrinsic compression upon the distal esophagus is noted which may be related to the distal descending thoracic aorta or enlarged left atrium. This somewhat limits the passage of contrast material into the stomach although a true mechanical obstruction is not present. IMPRESSION: Decreased motility in the mid to distal esophagus. Some extrinsic compression upon the distal esophagus is noted likely related to prominent left atrium or the ectatic aorta. Electronically Signed   By: Inez Catalina M.D.   On: 05/10/2019 16:26    Assessment and plan-  79 year old female with past medical history of recurrent UTI, COPD, tobacco abuse, hypertension, aorta aneurysm presented for evaluation of chest and abdominal pain.  #Multiple mixed lytic and sclerotic lesions with unknown primary Images were independently reviewed by me and discussed with patient and husband at the bedside. Check LFT, multiple myeloma panel, light chain ratio, LDH, vitamin D 25-hydroxy She will need outpatient PET for further evaluation and guiding biopsy sites.  #Recurrent UTI, on IV Rocephin.  Follow urine culture and sensitivity. Bilateral hydronephrosis, left proximal ureter prominence as well as wall enhancement.  Cannot exclude urothelial lesion within the proximal left ureter.   Retroperitoneal lymphadenopathy, reactive versus underlying malignancy metastasis.  Appreciate urology consultation and recommendation.  #Hypercalcemia, albumin level is 1.9 corrected calciumLevel at 11.5, slightly elevated. I will check PTH, PTH rp.  #Elevated alkaline phosphatase, likely secondary to bone lesions. #Anemia, hemoglobin 7.4, check reticulocyte panel.  MCV 95.9.  Check folate and vitamin B12 level. Check TSH   Thank you for allowing me to participate in the care of this patient.    Earlie Server, MD, PhD Hematology Oncology [facility] Pager- [phone] 05/26/2019

## 2019-05-27 ENCOUNTER — Encounter: Payer: Self-pay | Admitting: Internal Medicine

## 2019-05-27 ENCOUNTER — Observation Stay: Payer: Medicare PPO

## 2019-05-27 DIAGNOSIS — N3000 Acute cystitis without hematuria: Secondary | ICD-10-CM | POA: Diagnosis not present

## 2019-05-27 DIAGNOSIS — N3001 Acute cystitis with hematuria: Secondary | ICD-10-CM | POA: Diagnosis not present

## 2019-05-27 DIAGNOSIS — I82612 Acute embolism and thrombosis of superficial veins of left upper extremity: Secondary | ICD-10-CM

## 2019-05-27 DIAGNOSIS — R4182 Altered mental status, unspecified: Secondary | ICD-10-CM | POA: Diagnosis not present

## 2019-05-27 DIAGNOSIS — M899 Disorder of bone, unspecified: Secondary | ICD-10-CM | POA: Diagnosis not present

## 2019-05-27 LAB — URINE CULTURE: Culture: NO GROWTH

## 2019-05-27 LAB — VITAMIN B12: Vitamin B-12: 3808 pg/mL — ABNORMAL HIGH (ref 180–914)

## 2019-05-27 LAB — TSH: TSH: 4.931 u[IU]/mL — ABNORMAL HIGH (ref 0.350–4.500)

## 2019-05-27 LAB — TROPONIN I (HIGH SENSITIVITY): Troponin I (High Sensitivity): 10 ng/L (ref <18)

## 2019-05-27 LAB — FOLATE: Folate: 32 ng/mL (ref >5.9)

## 2019-05-27 MED ORDER — IOHEXOL 300 MG/ML  SOLN
125.0000 mL | Freq: Once | INTRAMUSCULAR | Status: AC | PRN
  Administered 2019-05-27: 125 mL via INTRAVENOUS

## 2019-05-27 MED ORDER — SODIUM CHLORIDE 0.9 % IV SOLN: INTRAVENOUS | Status: DC

## 2019-05-27 MED ORDER — SODIUM CHLORIDE 0.9 % IV SOLN
60.0000 mg | Freq: Once | INTRAVENOUS | Status: AC
  Administered 2019-05-27: 60 mg via INTRAVENOUS
  Filled 2019-05-27: qty 20

## 2019-05-27 NOTE — Progress Notes (Signed)
Notified by central tele pt SpO2 destst into 70 percentile. Upon entry for assesment noted pt had removed O2 [place]. Ranger replaced to pt. With flow rate of 4 Lpm. Education provided to pt. SpO2 at 99% at current will continue to monitor.

## 2019-05-27 NOTE — Care Management Obs Status (Signed)
MEDICARE OBSERVATION STATUS NOTIFICATION   Patient Details  Name: Holly Shelton MRN: [id number] Date of Birth: Jan 21, 1941   Medicare Observation Status Notification Given:  Yes    Maud Deed, LCSW 05/27/2019, 9:29 AM

## 2019-05-27 NOTE — Progress Notes (Addendum)
Hematology/Oncology Progress Note [facility] Telephone:([phone] [phone]  Patient Care Team: Patient, No Pcp Per as PCP - General (General Practice)   Name of the patient: Holly Shelton  [id number]  22-Jul-1940  Date of visit: 05/27/19   INTERVAL HISTORY-  No acute overnight events. Patient has no new complaint today.  Continues to feel fatigued. No family member at the bedside    Review of systems- Review of Systems  Constitutional: Positive for appetite change and fatigue.  Respiratory: Negative for chest tightness and shortness of breath.   Gastrointestinal: Negative for abdominal pain.  Musculoskeletal: Negative for back pain.  Neurological: Negative for dizziness and headaches.  Psychiatric/Behavioral: Negative for confusion.    Allergies  Allergen Reactions  . Penicillins Rash    Did it involve swelling of the face/tongue/throat, SOB, or low BP? No Did it involve sudden or severe rash/hives, skin peeling, or any reaction on the inside of your mouth or nose? Yes Did you need to seek medical attention at a hospital or doctor's office? No When did it last happen? If all above answers are "NO", may proceed with cephalosporin use.    Patient Active Problem List   Diagnosis Date Noted  . Lytic bone lesions on xray   . Superficial venous thrombosis of left arm   . Palliative care encounter   . Hypercalcemia   . Retroperitoneal lymphadenopathy   . Altered mental status   . Hypotension due to hypovolemia   . UTI (urinary tract infection) 05/18/2019  . Pre-op chest exam   . S/P right hip fracture   . Esophageal dysphagia   . Fall   . Closed displaced subtrochanteric fracture of right femur ([place]) 05/06/2019  . COPD (chronic obstructive pulmonary disease) ([place]) 05/06/2019  . Essential hypertension 05/06/2019  . Tobacco abuse 05/06/2019  . History of recurrent UTI (urinary tract infection) 05/06/2019  . GERD  (gastroesophageal reflux disease) 05/06/2019  . Aneurysm of descending thoracic aorta ([place]) 05/06/2019  . Closed fracture of right hip ([place]) 05/06/2019     Past Medical History:  Diagnosis Date  . Hypertension      Past Surgical History:  Procedure Laterality Date  . INTRAMEDULLARY (IM) NAIL INTERTROCHANTERIC Right 05/07/2019   Procedure: INTRAMEDULLARY (IM) NAIL INTERTROCHANTRIC;  Surgeon: Hessie Knows, MD;  Location: [facility] ORS;  Service: Orthopedics;  Laterality: Right;    Social History   Socioeconomic History  . Marital status: Married    Spouse name: Not on file  . Number of children: Not on file  . Years of education: Not on file  . Highest education level: Not on file  Occupational History  . Not on file  Tobacco Use  . Smoking status: Current Every Day Smoker  . Smokeless tobacco: Never Used  Substance and Sexual Activity  . Alcohol use: Never  . Drug use: Never  . Sexual activity: Not on file  Other Topics Concern  . Not on file  Social History Narrative  . Not on file   Social Determinants of Health   Financial Resource Strain:   . Difficulty of Paying Living Expenses:   Food Insecurity:   . Worried About Charity fundraiser in the Last Year:   . Arboriculturist in the Last Year:   Transportation Needs:   . Film/video editor (Medical):   Marland Kitchen Lack of Transportation (Non-Medical):   Physical Activity:   . Days of Exercise per Week:   . Minutes of Exercise per  Session:   Stress:   . Feeling of Stress :   Social Connections:   . Frequency of Communication with Friends and Family:   . Frequency of Social Gatherings with Friends and Family:   . Attends Religious Services:   . Active Member of Clubs or Organizations:   . Attends Archivist Meetings:   Marland Kitchen Marital Status:   Intimate Partner Violence:   . Fear of Current or Ex-Partner:   . Emotionally Abused:   Marland Kitchen Physically Abused:   . Sexually Abused:      History reviewed. No pertinent  family history.   Current Facility-Administered Medications:  .  0.9 %  sodium chloride infusion, , Intravenous, Continuous, Fritzi Mandes, MD .  acetaminophen (TYLENOL) tablet 650 mg, 650 mg, Oral, Q6H PRN, 650 mg at 05/26/19 1811 **OR** acetaminophen (TYLENOL) suppository 650 mg, 650 mg, Rectal, Q6H PRN, Mansy, Jan A, MD .  albuterol (PROVENTIL) (2.5 MG/3ML) 0.083% nebulizer solution 2.5 mg, 2.5 mg, Inhalation, Q6H PRN, Mansy, Jan A, MD .  alum & mag hydroxide-simeth (MAALOX/MYLANTA) 200-200-20 MG/5ML suspension 30 mL, 30 mL, Oral, Q4H PRN, Mansy, Jan A, MD .  aspirin EC tablet 81 mg, 81 mg, Oral, QHS, Mansy, Jan A, MD, 81 mg at 05/26/19 2132 .  bisacodyl (DULCOLAX) suppository 10 mg, 10 mg, Rectal, Daily PRN, Mansy, Jan A, MD .  calcium acetate (Phos Binder) (PHOSLYRA) 667 MG/5ML oral solution 667 mg, 667 mg, Oral, BID WC, Mansy, Jan A, MD, 667 mg at 05/27/19 1042 .  calcium-vitamin D (OSCAL WITH D) 500-200 MG-UNIT per tablet 1 tablet, 1 tablet, Oral, Daily, Mansy, Jan A, MD, 1 tablet at 05/27/19 1037 .  cephALEXin (KEFLEX) capsule 500 mg, 500 mg, Oral, TID, Fritzi Mandes, MD, 500 mg at 05/27/19 1037 .  cholecalciferol (VITAMIN D3) tablet 1,000 Units, 1,000 Units, Oral, Daily, Mansy, Jan A, MD, 1,000 Units at 05/27/19 1037 .  docusate sodium (COLACE) capsule 100 mg, 100 mg, Oral, BID, Mansy, Jan A, MD, 100 mg at 05/27/19 1038 .  enoxaparin (LOVENOX) injection 40 mg, 40 mg, Subcutaneous, Q24H, Mansy, Jan A, MD, 40 mg at 05/27/19 0630 .  feeding supplement (ENSURE ENLIVE) (ENSURE ENLIVE) liquid 237 mL, 237 mL, Oral, BID BM, Mansy, Jan A, MD, 237 mL at 05/27/19 1038 .  ferrous [id number] C-folic acid (TRINSICON / FOLTRIN) capsule 1 capsule, 1 capsule, Oral, BID, Mansy, Jan A, MD, 1 capsule at 05/27/19 1038 .  loratadine (CLARITIN) tablet 10 mg, 10 mg, Oral, Daily, Mansy, Jan A, MD, 10 mg at 05/27/19 1037 .  magnesium hydroxide (MILK OF MAGNESIA) suspension 30 mL, 30 mL, Oral, Daily PRN,  Mansy, Jan A, MD .  midodrine (PROAMATINE) tablet 10 mg, 10 mg, Oral, TID WC, Mansy, Jan A, MD, 10 mg at 05/27/19 1037 .  mometasone-formoterol (DULERA) 200-5 MCG/ACT inhaler 2 puff, 2 puff, Inhalation, BID, Mansy, Jan A, MD, 2 puff at 05/27/19 1042 .  morphine 2 MG/ML injection 0.5 mg, 0.5 mg, Intravenous, Q4H PRN, Fritzi Mandes, MD, 0.5 mg at 05/26/19 1519 .  multivitamin with minerals tablet 1 tablet, 1 tablet, Oral, Daily, Mansy, Jan A, MD, 1 tablet at 05/27/19 1037 .  nicotine (NICODERM CQ - dosed in mg/24 hours) patch 14 mg, 14 mg, Transdermal, Daily, Mansy, Jan A, MD .  ondansetron (ZOFRAN) tablet 4 mg, 4 mg, Oral, Q6H PRN **OR** ondansetron (ZOFRAN) injection 4 mg, 4 mg, Intravenous, Q6H PRN, Mansy, Jan A, MD .  oxybutynin (DITROPAN) tablet 5 mg, 5 mg, Oral,  TID, Mansy, Jan A, MD, 5 mg at 05/27/19 1037 .  oxyCODONE (Oxy IR/ROXICODONE) immediate release tablet 5 mg, 5 mg, Oral, Q4H PRN, Fritzi Mandes, MD, 5 mg at 05/27/19 1037 .  pamidronate (AREDIA) 60 mg in sodium chloride 0.9 % 500 mL IVPB, 60 mg, Intravenous, Once, Fritzi Mandes, MD, Last Rate: 130 mL/hr at 05/27/19 1045, 60 mg at 05/27/19 1045 .  pantoprazole (PROTONIX) EC tablet 40 mg, 40 mg, Oral, Daily, Mansy, Jan A, MD, 40 mg at 05/27/19 1038 .  polyethylene glycol (MIRALAX / GLYCOLAX) packet 17 g, 17 g, Oral, Daily, Mansy, Jan A, MD, 17 g at 05/27/19 1038 .  pregabalin (LYRICA) capsule 100 mg, 100 mg, Oral, TID, Mansy, Jan A, MD, 100 mg at 05/27/19 1037 .  rosuvastatin (CRESTOR) tablet 10 mg, 10 mg, Oral, QHS, Mansy, Jan A, MD, 10 mg at 05/26/19 2132 .  tiotropium [facility]) inhalation capsule ([facility] use ONLY) 18 mcg, 1 capsule, Inhalation, Daily, Mansy, Jan A, MD, 18 mcg at 05/27/19 1043 .  traZODone (DESYREL) tablet 25 mg, 25 mg, Oral, QHS PRN, Mansy, Arvella Merles, MD   Physical exam:  Vitals:   05/26/19 2000 05/26/19 2200 05/27/19 0000 05/27/19 0727  BP: (!) 157/64 116/60 124/62 127/73  Pulse: 94 98 (!) 102 98  Resp: (!) 25 (!) 23 15 (!)  22  Temp:   98.4 F (36.9 C) 98.4 F (36.9 C)  TempSrc:   Oral   SpO2: 98% 97% 99% 100%  Weight:      Height:       Physical Exam  Constitutional: No distress.  HENT:  Head: Normocephalic and atraumatic.  Mouth/Throat: No oropharyngeal exudate.  Eyes: Pupils are equal, round, and reactive to light. EOM are normal. No scleral icterus.  Cardiovascular: Normal rate and regular rhythm.  No murmur heard. Pulmonary/Chest: Effort normal. No respiratory distress.  Decreased breath sound bilaterally.  Abdominal: Soft. She exhibits no distension.  Musculoskeletal:        General: No edema. Normal range of motion.     Cervical back: Normal range of motion and neck supple.  Neurological: She is alert.  Skin: Skin is warm and dry. She is not diaphoretic.  Psychiatric: Affect normal.       CMP Latest Ref Rng & Units 05/26/2019  Glucose 70 - 99 mg/dL -  BUN 8 - 23 mg/dL -  Creatinine 0.44 - 1.00 mg/dL -  Sodium 135 - 145 mmol/L -  Potassium 3.5 - 5.1 mmol/L -  Chloride 98 - 111 mmol/L -  CO2 22 - 32 mmol/L -  Calcium 8.9 - 10.3 mg/dL -  Total Protein 6.5 - 8.1 g/dL 5.3(L)  Total Bilirubin 0.3 - 1.2 mg/dL 0.9  Alkaline Phos 38 - 126 U/L 664(H)  AST 15 - 41 U/L 40  ALT 0 - 44 U/L 11   CBC Latest Ref Rng & Units 05/26/2019  WBC 4.0 - 10.5 K/uL 11.2(H)  Hemoglobin 12.0 - 15.0 g/dL 7.4(L)  Hematocrit 36.0 - 46.0 % 23.6(L)  Platelets 150 - 400 K/uL 334    RADIOGRAPHIC STUDIES: I have personally reviewed the radiological images as listed and agreed with the findings in the report. DG Chest 1 View  Result Date: 05/06/2019 CLINICAL DATA:  Recent fall with chest pain, initial encounter EXAM: CHEST  1 VIEW COMPARISON:  05/19/2013 FINDINGS: Cardiac shadow is enlarged. Thoracic aorta is tortuous and accentuated by mild patient rotation. The lungs are clear bilaterally. No acute bony abnormality is seen. IMPRESSION: Cardiomegaly  and tortuous aorta.  No acute abnormality is seen.  Electronically Signed   By: Inez Catalina M.D.   On: 05/06/2019 19:03   CT Head Wo Contrast  Result Date: 05/26/2019 CLINICAL DATA:  Altered mental status. EXAM: CT HEAD WITHOUT CONTRAST TECHNIQUE: Contiguous axial images were obtained from the base of the skull through the vertex without intravenous contrast. COMPARISON:  None. FINDINGS: Brain: Age related cerebral atrophy, ventriculomegaly and periventricular white matter disease. No extra-axial fluid collections are identified. No CT findings for acute hemispheric infarction or intracranial hemorrhage. No mass lesions. The brainstem and cerebellum are normal. Vascular: Vascular calcifications but no aneurysm or hyperdense vessels. Skull: No skull fracture. Numerous small lucencies in the skull. Could not exclude metastatic disease or myeloma. Sinuses/Orbits: The paranasal sinuses and mastoid air cells are clear. Other: No scalp lesions or hematoma. IMPRESSION: 1. Age related cerebral atrophy, ventriculomegaly and periventricular white matter disease. 2. No acute intracranial findings or mass lesions. 3. Numerous small lucencies in the skull. Likely metastatic disease or myeloma. Electronically Signed   By: Marijo Sanes M.D.   On: 05/26/2019 07:27   US Venous Img Upper Uni Left  Addendum Date: 05/26/2019   ADDENDUM REPORT: 05/26/2019 04:49 ADDENDUM: Study discussed by telephone with Dr. Marjean Donna on 05/26/2019 at 0438 hours. Electronically Signed   By: Genevie Ann M.D.   On: 05/26/2019 04:49   Result Date: 05/26/2019 CLINICAL DATA:  79 year old female with left upper extremity swelling. Chest pain, shortness of breath. Thoracoabdominal aneurysm and mixed lytic and sclerotic skeletal lesions on CTA today. EXAM: LEFT UPPER EXTREMITY VENOUS DOPPLER ULTRASOUND TECHNIQUE: Gray-scale sonography with graded compression, as well as color Doppler and duplex ultrasound were performed to evaluate the upper extremity deep venous system from the level of the  subclavian vein and including the jugular, axillary, basilic, radial, ulnar and upper cephalic vein. Spectral Doppler was utilized to evaluate flow at rest and with distal augmentation maneuvers. COMPARISON:  CTA chest abdomen and pelvis earlier today. FINDINGS: Contralateral Subclavian Vein: Respiratory phasicity is normal and symmetric with the symptomatic side. No evidence of thrombus. Normal compressibility. Internal Jugular Vein: No evidence of thrombus. Normal compressibility, respiratory phasicity and response to augmentation. Subclavian Vein: No evidence of thrombus. Normal compressibility, respiratory phasicity and response to augmentation. Axillary Vein: No evidence of thrombus. Normal compressibility, respiratory phasicity and response to augmentation. Cephalic Vein: There is a thrombosed segment of the distal cephalic vein (image 15) extending to just above the elbow (images 16 and 17). The more proximal cephalic vein appears normally compressible and patent (image 14). And the more distal cephalic vein appears at least partially patent. Basilic Vein: No evidence of thrombus. Normal compressibility, respiratory phasicity and response to augmentation. Brachial Veins: No evidence of thrombus. Normal compressibility, respiratory phasicity and response to augmentation. Radial Veins: No evidence of thrombus. Normal compressibility, respiratory phasicity and response to augmentation. Ulnar Veins: No evidence of thrombus. Normal compressibility, respiratory phasicity and response to augmentation. Other Findings:  None visualized. IMPRESSION: 1. Positive for left upper extremity superficial venous thrombosis involving the midportion of the left cephalic vein. 2. Negative for left upper extremity deep venous thrombosis. Electronically Signed: By: Genevie Ann M.D. On: 05/26/2019 04:32   DG Chest Port 1 View  Result Date: 05/26/2019 CLINICAL DATA:  Chest pain. Dyspnea. EXAM: PORTABLE CHEST 1 VIEW COMPARISON:   Radiograph 8 days ago 05/18/2019. FINDINGS: Low lung volumes. Stable cardiomegaly. Stable mediastinal contours with aortic tortuosity. Obscuration of left hemidiaphragm likely a small  effusion. Streaky right lung base opacity. Vascular congestion. No visualized pneumothorax. No acute osseous abnormalities are seen. IMPRESSION: 1. Low lung volumes. Grossly stable cardiomegaly with vascular congestion. 2. Chronic aortic tortuosity, unchanged allowing for differences in technique and positioning. 3. Probable small left pleural effusion. 4. Streaky right lung base opacity, favor atelectasis. Electronically Signed   By: Keith Rake M.D.   On: 05/26/2019 01:36   DG Chest Portable 1 View  Result Date: 05/18/2019 CLINICAL DATA:  Sepsis. EXAM: PORTABLE CHEST 1 VIEW COMPARISON:  May 06, 2019. FINDINGS: Stable cardiomegaly. No pneumothorax or pleural effusion is noted. Left lung is clear. Minimal right basilar subsegmental atelectasis is noted. Bony thorax is unremarkable. IMPRESSION: Minimal right basilar subsegmental atelectasis. Electronically Signed   By: Marijo Conception M.D.   On: 05/18/2019 12:28   ECHOCARDIOGRAM COMPLETE  Result Date: 05/08/2019    ECHOCARDIOGRAM REPORT   Patient Name:   Holly Shelton Date of Exam: 05/08/2019 Medical Rec #:  [id number]      Height:       60.0 in Accession #:    [id number]     Weight:       156.5 lb Date of Birth:  1940-09-12      BSA:          1.682 m Patient Age:    30 years       BP:           87/57 mmHg Patient Gender: F              HR:           74 bpm. Exam Location:  [facility] Procedure: 2D Echo, Color Doppler and Cardiac Doppler Indications:     R07.9 Chest Pain  History:         Patient has no prior history of Echocardiogram examinations.                  Risk Factors:Hypertension.  Sonographer:     Charmayne Sheer RDCS (AE) Referring Phys:  [place] Diagnosing Phys: Kathlyn Sacramento MD  Sonographer Comments: Technically difficult study due to poor echo windows. TDS  due to inability to position pt due to broken hip. IMPRESSIONS  1. Left ventricular ejection fraction, by estimation, is 55 to 60%. The left ventricle has normal function. Left ventricular endocardial border not optimally defined to evaluate regional wall motion. There is mild left ventricular hypertrophy. Left ventricular diastolic parameters are consistent with Grade I diastolic dysfunction (impaired relaxation).  2. Right ventricular systolic function is normal. The right ventricular size is normal. Tricuspid regurgitation signal is inadequate for assessing PA pressure.  3. The mitral valve is normal in structure. No evidence of mitral valve regurgitation. No evidence of mitral stenosis.  4. The aortic valve is normal in structure. Aortic valve regurgitation is not visualized. No aortic stenosis is present.  5. The inferior vena cava is normal in size with greater than 50% respiratory variability, suggesting right atrial pressure of 3 mmHg. FINDINGS  Left Ventricle: Left ventricular ejection fraction, by estimation, is 55 to 60%. The left ventricle has normal function. Left ventricular endocardial border not optimally defined to evaluate regional wall motion. The left ventricular internal cavity size was normal in size. There is mild left ventricular hypertrophy. Left ventricular diastolic parameters are consistent with Grade I diastolic dysfunction (impaired relaxation). Right Ventricle: The right ventricular size is normal. No increase in right ventricular wall thickness. Right ventricular systolic function is normal. Tricuspid regurgitation  signal is inadequate for assessing PA pressure. Left Atrium: Left atrial size was normal in size. Right Atrium: Right atrial size was normal in size. Pericardium: There is no evidence of pericardial effusion. Mitral Valve: The mitral valve is normal in structure. Normal mobility of the mitral valve leaflets. No evidence of mitral valve regurgitation. No evidence of mitral  valve stenosis. MV peak gradient, 3.2 mmHg. The mean mitral valve gradient is 1.0 mmHg. Tricuspid Valve: The tricuspid valve is normal in structure. Tricuspid valve regurgitation is trivial. No evidence of tricuspid stenosis. Aortic Valve: The aortic valve is normal in structure. Aortic valve regurgitation is not visualized. No aortic stenosis is present. Aortic valve mean gradient measures 4.0 mmHg. Aortic valve peak gradient measures 9.5 mmHg. Aortic valve area, by VTI measures 3.58 cm. Pulmonic Valve: The pulmonic valve was normal in structure. Pulmonic valve regurgitation is not visualized. No evidence of pulmonic stenosis. Aorta: The aortic root is normal in size and structure. Venous: The inferior vena cava is normal in size with greater than 50% respiratory variability, suggesting right atrial pressure of 3 mmHg. IAS/Shunts: No atrial level shunt detected by color flow Doppler.  LEFT VENTRICLE PLAX 2D LVIDd:         3.35 cm  Diastology LVIDs:         2.08 cm  LV e' lateral:   6.09 cm/s LV PW:         0.83 cm  LV E/e' lateral: 8.9 LV IVS:        0.67 cm  LV e' medial:    6.42 cm/s LVOT diam:     2.40 cm  LV E/e' medial:  8.5 LV SV:         77 LV SV Index:   46 LVOT Area:     4.52 cm  LEFT ATRIUM           Index LA diam:      4.10 cm 2.44 cm/m LA Vol (A4C): 49.7 ml 29.55 ml/m  AORTIC VALVE                   PULMONIC VALVE AV Area (Vmax):    2.80 cm    PV Vmax:       1.05 m/s AV Area (Vmean):   3.42 cm    PV Vmean:      65.200 cm/s AV Area (VTI):     3.58 cm    PV VTI:        0.176 m AV Vmax:           154.00 cm/s PV Peak grad:  4.4 mmHg AV Vmean:          92.100 cm/s PV Mean grad:  2.0 mmHg AV VTI:            0.216 m AV Peak Grad:      9.5 mmHg AV Mean Grad:      4.0 mmHg LVOT Vmax:         95.30 cm/s LVOT Vmean:        69.600 cm/s LVOT VTI:          0.171 m LVOT/AV VTI ratio: 0.79  AORTA Ao Root diam: 3.40 cm MITRAL VALVE MV Area (PHT): 4.68 cm    SHUNTS MV Peak grad:  3.2 mmHg    Systemic VTI:  0.17 m  MV Mean grad:  1.0 mmHg    Systemic Diam: 2.40 cm MV Vmax:       0.89 m/s MV  Vmean:      41.3 cm/s MV Decel Time: 162 msec MV E velocity: 54.30 cm/s MV A velocity: 79.50 cm/s MV E/A ratio:  0.68 Kathlyn Sacramento MD Electronically signed by Kathlyn Sacramento MD Signature Date/Time: 05/08/2019/4:50:52 PM    Final    CT Angio Chest/Abd/Pel for Dissection W and/or Wo Contrast  Result Date: 05/26/2019 CLINICAL DATA:  Chest, abdomen and back pain EXAM: CT ANGIOGRAPHY CHEST, ABDOMEN AND PELVIS TECHNIQUE: Non-contrast CT of the chest was initially obtained. Multidetector CT imaging through the chest, abdomen and pelvis was performed using the standard protocol during bolus administration of intravenous contrast. Multiplanar reconstructed images and MIPs were obtained and reviewed to evaluate the vascular anatomy. CONTRAST:  144m OMNIPAQUE IOHEXOL 350 MG/ML SOLN COMPARISON:  None. FINDINGS: CTA CHEST FINDINGS Cardiovascular: Aneurysmal dilatation of the distal aortic arch measuring up to 5.3 cm. Descending thoracic aorta is dilated measuring 5.5 cm just above the aortic hiatus. Extensive mural plaque within the distal descending thoracic aorta. No dissection. Cardiomegaly. Ascending thoracic aorta mildly aneurysmal at 4.1 cm. Aortic atherosclerosis. No filling defects in the pulmonary arteries to suggest pulmonary emboli. Mediastinum/Nodes: Esophagus is fluid-filled. No mediastinal, hilar, or axillary adenopathy. Lungs/Pleura: Small to moderate bilateral pleural effusions. Vascular congestion. Compressive atelectasis in the lungs bilaterally. Musculoskeletal: Chest wall soft tissues are unremarkable. Scoliosis. Diffuse degenerative changes in the thoracic spine. Sclerotic foci seen throughout the ribs and thoracic spine. Lytic expansile lesion within the posterior left 5th rib measures 15 mm. Numerous old and healing fractures, possibly pathologic. Lytic lesion within the right posterior 10th rib. Small sclerotic foci within  the clavicles, humeral heads and scapula. Destructive lytic lesion within the left scapula. Review of the MIP images confirms the above findings. CTA ABDOMEN AND PELVIS FINDINGS VASCULAR Aorta: Markedly aneurysmal, measuring up to 5.8 cm in the proximal aorta, tapering to normal size above the bifurcation, 1.6 cm. Extensive mural plaque/thrombus within the proximal aorta. Celiac: Patent SMA: Patent Renals: Small bilaterally, patent. IMA: Patent Inflow: Atherosclerotic calcifications. Patent. No aneurysm or dissection. Veins: No obvious venous abnormality within the limitations of this arterial phase study. Review of the MIP images confirms the above findings. NON-VASCULAR Hepatobiliary: Layering gallstones within the gallbladder. No focal hepatic abnormality. Pancreas: No focal abnormality or ductal dilatation. Spleen: No focal abnormality.  Normal size. Adrenals/Urinary Tract: Bilateral hydronephrosis. 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 Left ureter is mildly prominent proximally then tapers to normal size. No visible obstructing process. 13 mm nonobstructing right midpole renal stone. Adrenal glands and urinary bladder unremarkable. Stomach/Bowel: Moderate stool burden in the colon. No evidence of bowel obstruction. Lymphatic: Retroperitoneal lymph node. Index left periaortic node has a short axis diameter of 15 mm. Separate adjacent left periaortic lymph node has a short axis diameter of 11 mm. Reproductive: Prior hysterectomy.  No adnexal masses. Other: No free fluid or free air. Musculoskeletal: Extensive mixed lytic and sclerotic foci throughout the lumbar spine and bony pelvis concerning for bony metastatic disease. Review of the MIP images confirms the above findings. 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 This measures up to 5.5 cm in the distal aortic arch. 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 Extensive  mural plaque/thrombus in the distal descending thoracic aorta and upper abdominal aorta. No evidence of dissection. No leak. Small to moderate bilateral pleural effusions. Compressive atelectasis in the lower lobes. Bilateral hydronephrosis. 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 On the left,  the proximal ureter appears mildly prominent as well with possible wall enhancement. No visible obstructing stones. Cannot exclude urothelial lesion within the proximal left ureter. Nonobstructing right nephrolithiasis. Retroperitoneal adenopathy. Extensive mixed lytic and sclerotic lesions throughout the visualized skeleton most compatible with osseous metastatic disease. This is of unknown primary. Electronically Signed   By: Rolm Baptise M.D.   On: 05/26/2019 02:35   DG HIP PORT UNILAT WITH PELVIS 1V RIGHT  Result Date: 05/09/2019 CLINICAL DATA:  RIGHT hip pain, hip surgery on 05/07/2019 EXAM: DG HIP (WITH OR WITHOUT PELVIS) 1V PORT RIGHT COMPARISON:  05/07/2019 FINDINGS: IM nail with compression screw at proximal RIGHT femur post ORIF. Bones demineralized. No additional fracture, dislocation, or bone destruction. Degenerative disc and facet disease changes of visualized lower lumbar spine. IMPRESSION: Osseous demineralization post ORIF of proximal RIGHT femoral fracture. No new abnormalities or significant change since 05/07/2019. Electronically Signed   By: Lavonia Dana M.D.   On: 05/09/2019 09:57   DG HIP OPERATIVE UNILAT W OR W/O PELVIS RIGHT  Result Date: 05/07/2019 CLINICAL DATA:  Fracture fixation. Subtrochanteric fracture of the right hip. EXAM: OPERATIVE right HIP (WITH PELVIS IF PERFORMED) 6 VIEWS TECHNIQUE: Fluoroscopic spot image(s) were submitted for interpretation post-operatively. COMPARISON:  Radiographs 05/06/2019 FINDINGS: Multiple intraoperative fluoroscopic spot images demonstrate placement of a long intramedullary gamma nail and the proximal dynamic hip screw and single distal  interlocking screw. Good position and alignment of the fracture with anatomic reduction. IMPRESSION: Anatomic reduction of the right hip fracture with internal fixation. Electronically Signed   By: Marijo Sanes M.D.   On: 05/07/2019 12:45   DG Hip Unilat W or Wo Pelvis 2-3 Views Right  Result Date: 05/06/2019 CLINICAL DATA:  Right hip pain post fall. EXAM: DG HIP (WITH OR WITHOUT PELVIS) 2-3V RIGHT COMPARISON:  None. FINDINGS: There is a transverse severely displaced and impacted subtrochanteric fracture of the proximal right femoral shaft. The right femoral head is normally located within the acetabulum. IMPRESSION: Transverse severely displaced and impacted subtrochanteric fracture of the proximal right femoral shaft. Electronically Signed   By: Fidela Salisbury M.D.   On: 05/06/2019 19:06   DG ESOPHAGUS W SINGLE CM (SOL OR THIN BA)  Result Date: 05/10/2019 CLINICAL DATA:  Difficulty swallowing with pain EXAM: ESOPHOGRAM/BARIUM SWALLOW TECHNIQUE: Single contrast examination was performed using  thin barium. FLUOROSCOPY TIME:  Fluoroscopy Time:  2 minutes Radiation Exposure Index (if provided by the fluoroscopic device): 26 mGy Number of Acquired Spot Images: Multiple cine fluoroscopic runs COMPARISON:  None. FINDINGS: Swallowing mechanism is within normal limits. There is normal esophageal transit proximally. No definitive mucosal abnormality is seen. The degree of motility in the mid to distal esophagus however is significantly decreased. Some extrinsic compression upon the distal esophagus is noted which may be related to the distal descending thoracic aorta or enlarged left atrium. This somewhat limits the passage of contrast material into the stomach although a true mechanical obstruction is not present. IMPRESSION: Decreased motility in the mid to distal esophagus. Some extrinsic compression upon the distal esophagus is noted likely related to prominent left atrium or the ectatic aorta.  Electronically Signed   By: Inez Catalina M.D.   On: 05/10/2019 16:26    Assessment and plan-  79 year old female with past medical history of recurrent UTI, COPD, tobacco abuse, hypertension, aorta aneurysm presented for evaluation of chest and abdominal pain.  #Multiple mixed lytic and sclerotic lesions with unknown primary Images were independently reviewed by me and discussed with  patient and husband at the bedside.  multiple myeloma panel, light chain ratio results are pending.   She will need outpatient PET for further evaluation.   #Recurrent UTI, on IV Rocephin.  Follow urine culture and sensitivity. Bilateral hydronephrosis, left proximal ureter prominence as well as wall enhancement.  Cannot exclude urothelial lesion within the proximal left ureter.  Retroperitoneal lymphadenopathy, reactive versus underlying malignancy metastasis.    Appreciate urology recommendation.  Urology recommends CT urogram for further evaluation.  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  #Hypercalcemia, albumin level is 1.9 corrected calciumLevel at 11.5, slightly elevated. I will check PTH, PTH rp.  And results are pending.  In the context of multiple bone lesions, initial decreased mental status/somnolence, cancer work-up in progress, I recommend 1 dose of pamidronate 60 mg for treatment of hypercalcemia.  #Elevated alkaline phosphatase, likely secondary to bone lesions. #Anemia, hemoglobin 7.4, check reticulocyte panel.  MCV 95.9.  Check folate and vitamin B12 level. TSH is mildly elevated.  Check free T4. #Left upper extremity superficial venous thrombosis involving midportion of the left cephalic vein.  No DVT.  Continue DVT prophylaxis Continue supportive care.   Thank you for allowing me to participate in the care of this patient.  Plan was discussed with Dr. Posey Pronto.  Earlie Server, MD, PhD Hematology Oncology [facility] at [facility] Pager- [phone] 05/27/2019

## 2019-05-27 NOTE — Progress Notes (Signed)
   05/27/19 2341  Assess: MEWS Score  Temp 98.6 F (37 C)  BP 120/80  Pulse Rate (!) 113  ECG Heart Rate (!) 113  Resp 20  SpO2 100 %  Assess: MEWS Score  MEWS Temp 0  MEWS Systolic 0  MEWS Pulse 2  MEWS RR 0  MEWS LOC 0  MEWS Score 2  MEWS Score Color Yellow  Assess: if the MEWS score is Yellow or Red  Were vital signs taken at a resting state? Yes  Focused Assessment Documented focused assessment  Early Detection of Sepsis Score *See Row Information* Medium  MEWS guidelines implemented *See Row Information* Yes  MEWS Guidelines - (patients age 77 and over)   Pt assessed. HR Tachycardic. Repositioned for comfort. Thermostat adjusted to improve room tempeture. Pain medication provided earlier in shift. Pt expresses some anxiety about inability to sleep. Trazodone PRN given earlier in shift. SpO2 100% on 4L [place]. Sinus Rhythm. Other VSS. Will continue to monitor.

## 2019-05-27 NOTE — Progress Notes (Signed)
Shift report received from Lupita Leash, [place]. Morning assessment completed. Pt. Resting, unable to identify any pain or needs at this time. Call light is within reach, will continue to monitor.

## 2019-05-27 NOTE — Progress Notes (Signed)
[place] [facility] [place] NAME: Holly Shelton    MR#:  [id number]  DATE OF BIRTH:  August 11, 1940  SUBJECTIVE:   Patient came in from [facility] with altered mental status.  Patient is much improved. Eating a little better. Husband at bedside. Had some generalized pain received oral pain meds. REVIEW OF SYSTEMS:   Review of Systems  Constitutional: Positive for malaise/fatigue. Negative for chills, fever and weight loss.  HENT: Negative for ear discharge, ear pain and nosebleeds.   Eyes: Negative for blurred vision, pain and discharge.  Respiratory: Negative for sputum production, shortness of breath, wheezing and stridor.   Cardiovascular: Negative for chest pain, palpitations, orthopnea and PND.  Gastrointestinal: Negative for abdominal pain, diarrhea, nausea and vomiting.  Genitourinary: Negative for frequency and urgency.  Musculoskeletal: Positive for back pain and joint pain.  Neurological: Positive for weakness. Negative for sensory change, speech change and focal weakness.  Psychiatric/Behavioral: Negative for depression and hallucinations. The patient is not nervous/anxious.    Tolerating Diet:yes Tolerating PT: SNF  DRUG ALLERGIES:   Allergies  Allergen Reactions  . Penicillins Rash    Did it involve swelling of the face/tongue/throat, SOB, or low BP? No Did it involve sudden or severe rash/hives, skin peeling, or any reaction on the inside of your mouth or nose? Yes Did you need to seek medical attention at a hospital or doctor's office? No When did it last happen? If all above answers are "NO", may proceed with cephalosporin use.    VITALS:  Blood pressure 127/73, pulse 98, temperature 98.4 F (36.9 C), resp. rate (!) 22, height 5' (1.524 m), weight 78.2 kg, SpO2 100 %.  PHYSICAL EXAMINATION:   Physical Exam  GENERAL:  79 y.o.-year-old patient lying in the bed with no acute distress. Obese Appears chronically  ill EYES: Pupils equal, round, reactive to light and accommodation. No scleral icterus.   HEENT: Head atraumatic, normocephalic. Oropharynx and nasopharynx clear.  NECK:  Supple, no jugular venous distention. No thyroid enlargement, no tenderness.  LUNGS: Normal breath sounds bilaterally, no wheezing, rales, rhonchi. No use of accessory muscles of respiration.  CARDIOVASCULAR: S1, S2 normal. No murmurs, rubs, or gallops.  ABDOMEN: Soft, nontender, nondistended. Bowel sounds present. No organomegaly or mass.  EXTREMITIES: bilateral lower extremity chronic edema . left upper extremity edema NEUROLOGIC: Cranial nerves II through XII are intact. No focal Motor or sensory deficits b/l. Generalized deconditioning PSYCHIATRIC: patient is alert and oriented x 2  SKIN: No obvious rash, lesion, or ulcer.  Pressure Injury 05/26/19 Buttocks Left Deep Tissue Pressure Injury - Purple or maroon localized area of discolored intact skin or blood-filled blister due to damage of underlying soft tissue from pressure and/or shear. (Active)  05/26/19 0600  Location: Buttocks  Location Orientation: Left  Staging: Deep Tissue Pressure Injury - Purple or maroon localized area of discolored intact skin or blood-filled blister due to damage of underlying soft tissue from pressure and/or shear.  Wound Description (Comments):   Present on Admission: Yes    LABORATORY PANEL:  CBC Recent Labs  Lab 05/26/19 0424  WBC 11.2*  HGB 7.4*  HCT 23.6*  PLT 334    Chemistries  Recent Labs  Lab 05/21/19 1219 05/26/19 0424 05/26/19 0858  NA  --  138  --   K  --  3.4*  --   CL  --  98  --   CO2  --  33*  --   GLUCOSE  --  95  --   BUN  --  12  --   CREATININE  --  0.63  --   CALCIUM  --  9.8  --   AST   < >  --  40  ALT   < >  --  11  ALKPHOS   < >  --  664*  BILITOT   < >  --  0.9   < > = values in this interval not displayed.   Cardiac Enzymes No results for input(s): TROPONINI in the last 168  hours. RADIOLOGY:  CT Head Wo Contrast  Result Date: 05/26/2019 CLINICAL DATA:  Altered mental status. EXAM: CT HEAD WITHOUT CONTRAST TECHNIQUE: Contiguous axial images were obtained from the base of the skull through the vertex without intravenous contrast. COMPARISON:  None. FINDINGS: Brain: Age related cerebral atrophy, ventriculomegaly and periventricular white matter disease. No extra-axial fluid collections are identified. No CT findings for acute hemispheric infarction or intracranial hemorrhage. No mass lesions. The brainstem and cerebellum are normal. Vascular: Vascular calcifications but no aneurysm or hyperdense vessels. Skull: No skull fracture. Numerous small lucencies in the skull. Could not exclude metastatic disease or myeloma. Sinuses/Orbits: The paranasal sinuses and mastoid air cells are clear. Other: No scalp lesions or hematoma. IMPRESSION: 1. Age related cerebral atrophy, ventriculomegaly and periventricular white matter disease. 2. No acute intracranial findings or mass lesions. 3. Numerous small lucencies in the skull. Likely metastatic disease or myeloma. Electronically Signed   By: Marijo Sanes M.D.   On: 05/26/2019 07:27   US Venous Img Upper Uni Left  Addendum Date: 05/26/2019   ADDENDUM REPORT: 05/26/2019 04:49 ADDENDUM: Study discussed by telephone with Dr. Marjean Donna on 05/26/2019 at 0438 hours. Electronically Signed   By: Genevie Ann M.D.   On: 05/26/2019 04:49   Result Date: 05/26/2019 CLINICAL DATA:  79 year old female with left upper extremity swelling. Chest pain, shortness of breath. Thoracoabdominal aneurysm and mixed lytic and sclerotic skeletal lesions on CTA today. EXAM: LEFT UPPER EXTREMITY VENOUS DOPPLER ULTRASOUND TECHNIQUE: Gray-scale sonography with graded compression, as well as color Doppler and duplex ultrasound were performed to evaluate the upper extremity deep venous system from the level of the subclavian vein and including the jugular, axillary,  basilic, radial, ulnar and upper cephalic vein. Spectral Doppler was utilized to evaluate flow at rest and with distal augmentation maneuvers. COMPARISON:  CTA chest abdomen and pelvis earlier today. FINDINGS: Contralateral Subclavian Vein: Respiratory phasicity is normal and symmetric with the symptomatic side. No evidence of thrombus. Normal compressibility. Internal Jugular Vein: No evidence of thrombus. Normal compressibility, respiratory phasicity and response to augmentation. Subclavian Vein: No evidence of thrombus. Normal compressibility, respiratory phasicity and response to augmentation. Axillary Vein: No evidence of thrombus. Normal compressibility, respiratory phasicity and response to augmentation. Cephalic Vein: There is a thrombosed segment of the distal cephalic vein (image 15) extending to just above the elbow (images 16 and 17). The more proximal cephalic vein appears normally compressible and patent (image 14). And the more distal cephalic vein appears at least partially patent. Basilic Vein: No evidence of thrombus. Normal compressibility, respiratory phasicity and response to augmentation. Brachial Veins: No evidence of thrombus. Normal compressibility, respiratory phasicity and response to augmentation. Radial Veins: No evidence of thrombus. Normal compressibility, respiratory phasicity and response to augmentation. Ulnar Veins: No evidence of thrombus. Normal compressibility, respiratory phasicity and response to augmentation. Other Findings:  None visualized. IMPRESSION: 1. Positive for left upper extremity superficial venous thrombosis involving the midportion  of the left cephalic vein. 2. Negative for left upper extremity deep venous thrombosis. Electronically Signed: By: Genevie Ann M.D. On: 05/26/2019 04:32   DG Chest Port 1 View  Result Date: 05/26/2019 CLINICAL DATA:  Chest pain. Dyspnea. EXAM: PORTABLE CHEST 1 VIEW COMPARISON:  Radiograph 8 days ago 05/18/2019. FINDINGS: Low lung  volumes. Stable cardiomegaly. Stable mediastinal contours with aortic tortuosity. Obscuration of left hemidiaphragm likely a small effusion. Streaky right lung base opacity. Vascular congestion. No visualized pneumothorax. No acute osseous abnormalities are seen. IMPRESSION: 1. Low lung volumes. Grossly stable cardiomegaly with vascular congestion. 2. Chronic aortic tortuosity, unchanged allowing for differences in technique and positioning. 3. Probable small left pleural effusion. 4. Streaky right lung base opacity, favor atelectasis. Electronically Signed   By: Keith Rake M.D.   On: 05/26/2019 01:36   CT Angio Chest/Abd/Pel for Dissection W and/or Wo Contrast  Result Date: 05/26/2019 CLINICAL DATA:  Chest, abdomen and back pain EXAM: CT ANGIOGRAPHY CHEST, ABDOMEN AND PELVIS TECHNIQUE: Non-contrast CT of the chest was initially obtained. Multidetector CT imaging through the chest, abdomen and pelvis was performed using the standard protocol during bolus administration of intravenous contrast. Multiplanar reconstructed images and MIPs were obtained and reviewed to evaluate the vascular anatomy. CONTRAST:  1110m OMNIPAQUE IOHEXOL 350 MG/ML SOLN COMPARISON:  None. FINDINGS: CTA CHEST FINDINGS Cardiovascular: Aneurysmal dilatation of the distal aortic arch measuring up to 5.3 cm. Descending thoracic aorta is dilated measuring 5.5 cm just above the aortic hiatus. Extensive mural plaque within the distal descending thoracic aorta. No dissection. Cardiomegaly. Ascending thoracic aorta mildly aneurysmal at 4.1 cm. Aortic atherosclerosis. No filling defects in the pulmonary arteries to suggest pulmonary emboli. Mediastinum/Nodes: Esophagus is fluid-filled. No mediastinal, hilar, or axillary adenopathy. Lungs/Pleura: Small to moderate bilateral pleural effusions. Vascular congestion. Compressive atelectasis in the lungs bilaterally. Musculoskeletal: Chest wall soft tissues are unremarkable. Scoliosis. Diffuse  degenerative changes in the thoracic spine. Sclerotic foci seen throughout the ribs and thoracic spine. Lytic expansile lesion within the posterior left 5th rib measures 15 mm. Numerous old and healing fractures, possibly pathologic. Lytic lesion within the right posterior 10th rib. Small sclerotic foci within the clavicles, humeral heads and scapula. Destructive lytic lesion within the left scapula. Review of the MIP images confirms the above findings. CTA ABDOMEN AND PELVIS FINDINGS VASCULAR Aorta: Markedly aneurysmal, measuring up to 5.8 cm in the proximal aorta, tapering to normal size above the bifurcation, 1.6 cm. Extensive mural plaque/thrombus within the proximal aorta. Celiac: Patent SMA: Patent Renals: Small bilaterally, patent. IMA: Patent Inflow: Atherosclerotic calcifications. Patent. No aneurysm or dissection. Veins: No obvious venous abnormality within the limitations of this arterial phase study. Review of the MIP images confirms the above findings. NON-VASCULAR Hepatobiliary: Layering gallstones within the gallbladder. No focal hepatic abnormality. Pancreas: No focal abnormality or ductal dilatation. Spleen: No focal abnormality.  Normal size. Adrenals/Urinary Tract: Bilateral hydronephrosis. 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 Left ureter is mildly prominent proximally then tapers to normal size. No visible obstructing process. 13 mm nonobstructing right midpole renal stone. Adrenal glands and urinary bladder unremarkable. Stomach/Bowel: Moderate stool burden in the colon. No evidence of bowel obstruction. Lymphatic: Retroperitoneal lymph node. Index left periaortic node has a short axis diameter of 15 mm. Separate adjacent left periaortic lymph node has a short axis diameter of 11 mm. Reproductive: Prior hysterectomy.  No adnexal masses. Other: No free fluid or free air. Musculoskeletal: Extensive mixed lytic and sclerotic foci throughout the lumbar spine and bony pelvis  concerning for bony metastatic disease. Review of the MIP images confirms the above findings. 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 This measures up to 5.5 cm in the distal aortic arch. 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 Extensive mural plaque/thrombus in the distal descending thoracic aorta and upper abdominal aorta. No evidence of dissection. No leak. Small to moderate bilateral pleural effusions. Compressive atelectasis in the lower lobes. Bilateral hydronephrosis. 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 On the left, the proximal ureter appears mildly prominent as well with possible wall enhancement. No visible obstructing stones. Cannot exclude urothelial lesion within the proximal left ureter. Nonobstructing right nephrolithiasis. Retroperitoneal adenopathy. Extensive mixed lytic and sclerotic lesions throughout the visualized skeleton most compatible with osseous metastatic disease. This is of unknown primary. Electronically Signed   By: Rolm Baptise M.D.   On: 05/26/2019 02:35   ASSESSMENT AND PLAN:  Destanee Bedonie  is a 79 y.o. female with a known history of hypertension and thoracic aortic aneurysm, hypertension, ongoing tobacco abuse and COPD and recurrent UTI, who presented to the emergency room with acute onset of chest pain as well as abdominal pain and left arm swelling with mild pain without erythema and altered mental status.   She has been having urinary frequency without dysuria or hematuria or flank pain. Patient is from [facility].  1.  Osseous lytic/sclerotic lesions  with unknown primary  -Pain management PRN oxycodone  -An oncology consultation with Dr. Tasia Catchings  --Multiple myeloma vs metastatic leisons -- myeloma panel pending -- peripheral smear-- blast or any other abnormal cells noted -- elevated alkaline phosphatase due to bony lesions  2. Recurrent UTI -We will continue the  patient on IV Rocephin-- to oral Keflex according to last week's urine culture sensitivity -cannot exclude urothelial lesion within the proximal left ureter. 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 Diamantina Providence appreciated-- recommends CT urogram ? Uroepithelial malignancy  3.  Left upper extremity superficial venous thrombosis -Pain management will be provided with NSAIDs, heating pad  4.  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 Sammuel Hines at [facility]  5.  Hypertension. -Continue Norvasc and Inderal.  6.  Depression. -Wellbutrin SR will be continued.  7.  COPD without exacerbation. -  cont as needed duo nebs.  8.   anemia of chronic disease -continue to monitor. No active bleeding. Will transfuse as needed  9.  Ongoing tobacco abuse. -Patient advised smoking cessation.  10.  DVT prophylaxis. -Subcutaneous Lovenox.   11. severe hypoalbuminemia/FTT/repeated admissions for different reasons/deconditioned -appears poorly nourished with significant deconditioning after early April hip fracture surgery. This is patient's third admission in April. -Palliative care consultation appreciated. Currently family and patient wants to be full code and continue aggressive management for multiple medical problems.  12. Hypercalcemia. Albumin level is 1.9. Corrected calcium is 11.5 -elevated LDH -PTH results pending -Pamidronate 60 mg x1 given initial decreased mental status/somnolence and cancer work in progress   Procedures: Family communication : husband in the room. Daughter Levada Dy on the phone Consults : oncology, vascular surgery, palliative care CODE STATUS: full DVT Prophylaxis : Lovenox  Status is: observation patient CT urogram pending for today per Urology recommendation She needs insurance  authorization according to [facility] prior to return back to [facility] which will likely happen on Monday,  April 26th  Dispo: The patient is from: Home              Anticipated d/c is to: SNF              Anticipated d/c date is: April 26th              Patient currently stable    TOTAL TIME TAKING CARE OF THIS PATIENT: 25  minutes.  >50% time spent on counselling and coordination of care  Note: This dictation was prepared with Dragon dictation along with smaller phrase technology. Any transcriptional errors that result from this process are unintentional.  Fritzi Mandes M.D    [facility]   CC: Primary care physician; Patient, No Pcp PerPatient ID: Holly Shelton, female   DOB: 1940/03/25, 79 y.o.   MRN: [id number]

## 2019-05-27 NOTE — Care Management CC44 (Signed)
Condition Code 44 Documentation Completed  Patient Details  Name: Holly Shelton MRN: [id number] Date of Birth: 1940/12/06   Condition Code 44 given:  Yes Patient signature on Condition Code 44 notice:  Yes Documentation of 2 MD's agreement:  Yes Code 44 added to claim:  Yes    Maud Deed, LCSW 05/27/2019, 9:30 AM

## 2019-05-27 NOTE — Progress Notes (Addendum)
Urology Consult   I have been asked to see the patient by Dr. Posey Pronto, for evaluation and management of "bilateral hydronephrosis", suspicion for metastatic disease of unknown primary, and recurrent UTIs, and gross hematuria  Chief Complaint: Gross hematuria  HPI:  Holly Shelton is a 79 y.o. year old with history notable for thoracic aortic aneurysm(5.3cm), recent hip fracture and repair, and recurrent UTIs who was recently admitted with chest pain and on CT angio of the chest/abdomen/pelvis on 4/23 with found to have possible bilateral hydronephrosis with some thickening of the left proximal ureter as well as retroperitoneal adenopathy and extensive mixed lytic and sclerotic lesions throughout the skeleton most compatible with osseous metastatic disease of unknown primary.  She has had multiple CTs in the past in the [facility] system which I am unfortunately unable to personally review, however the reports comment on right-sided hydronephrosis v. chronic right UPJ obstruction v. bilateral extrarenal pelvises since at least 2004.  Most recent urine culture on 4/23 showed no growth, however urine culture 4/15 grew >100k E Coli.  She is a poor historian.  She reports a long history of hematuria, at least " a few months."  She also has a long history of urinary urgency, frequency, and incontinence.  She reports some occasional stomach pain but denies any flank or groin pain.  She is unsure if she has had weight loss.  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  She is afebrile and hemodynamically stable.  Labs notable for WBC of 11.2, creatinine 0.63(EGFR greater than 60), elevated alk phos of 664, and elevated LDH of 289.  PMH: Past Medical History:  Diagnosis Date  . Hypertension     Surgical History: Past Surgical History:  Procedure Laterality Date  . INTRAMEDULLARY (IM) NAIL INTERTROCHANTERIC Right 05/07/2019   Procedure: INTRAMEDULLARY (IM) NAIL INTERTROCHANTRIC;  Surgeon: Hessie Knows, MD;  Location: [facility] ORS;  Service: Orthopedics;  Laterality: Right;    Allergies:  Allergies  Allergen Reactions  . Penicillins Rash    Did it involve swelling of the face/tongue/throat, SOB, or low BP? No Did it involve sudden or severe rash/hives, skin peeling, or any reaction on the inside of your mouth or nose? Yes Did you need to seek medical attention at a hospital or doctor's office? No When did it last happen? If all above answers are "NO", may proceed with cephalosporin use.    Family History: History reviewed. No pertinent family history.  Social History:  reports that she has been smoking. She has never used smokeless tobacco. She reports that she does not drink alcohol or use drugs.  ROS: Negative aside from those stated in the HPI.  Physical Exam: BP 127/73   Pulse 98   Temp 98.4 F (36.9 C)   Resp (!) 22   Ht 5' (1.524 m)   Wt 78.2 kg   SpO2 100%   BMI 33.67 kg/m    Constitutional: Elderly, chronically ill-appearing, frail Cardiovascular: No clubbing, cyanosis, or edema. Respiratory: Normal respiratory effort, no increased work of breathing. GI: Abdomen is soft, nontender, nondistended, no abdominal masses GU: No CVA tenderness  Laboratory Data: Reviewed in epic, see HPI  Pertinent Imaging: I have personally reviewed the CT angio chest/abdomen/pelvis.  Possible bilateral hydronephrosis versus extrarenal pelvises, I would favor extrarenal pelvises based on the reports from prior CT imaging.  There is some thickening of the left proximal ureter.  Bladder  not well evaluated.  Retroperitoneal lymphadenopathy and osseous lesions worrisome for widely metastatic disease of unknown primary.  Unfortunately unable to personally review the prior cross-sectional imaging from the [facility] system, however the old CTs comment on chronic right hydronephrosis versus extrarenal pelvis versus  chronic right UPJ obstruction, as well as a left extrarenal pelvis.  I am unable to review the actual images to evaluate for any change.  Head CT also shows multiple likely metastatic lesions in the skull  Assessment & Plan:   She is a 79 year old frail-appearing female who presents with multiple months of gross hematuria and CT showing suspected widely metastatic disease with retroperitoneal lymphadenopathy and osseous metastatic disease of unknown primary.  Urine culture is negative and she denies any flank pain, renal function is normal.  I have a high suspicion for metastatic disease from a urothelial origin with her history of gross hematuria.  I had a discussion with the patient and her husband today reviewing the CT findings and likely diagnosis of metastatic malignancy with unknown primary, but high suspicion for bladder versus upper tract urothelial based on her risk factors and history.  We discussed the next steps in work-up include tissue diagnosis and we will coordinate with oncology to determine optimal site.  We also discussed that there is not typically a role for surgical removal in the setting of metastatic disease.  We also discussed possible need for drainage of the either the left, right, or bilateral kidneys in the setting of either infection, worsening renal function, or flank pain with either ureteral stent or percutaneous nephrostomy tube in the future.  Recommendations: Agree with palliative care involvement Urine cytology CT urogram to better evaluate bladder and upper tracts, gross hematuria workup Will work with oncology to determine optimal site/procedure for tissue diagnosis and further treatment options  Billey Co, MD  Total time spent on the floor was 75 minutes, with greater than 50% spent in counseling and coordination of care with the patient regarding gross hematuria, CT findings, likely diagnosis of metastatic disease if possible urologic origin, and work-up  and treatment options  [facility] [address], [place], [place] [address] [phone]

## 2019-05-28 DIAGNOSIS — M899 Disorder of bone, unspecified: Secondary | ICD-10-CM | POA: Diagnosis not present

## 2019-05-28 DIAGNOSIS — R4182 Altered mental status, unspecified: Secondary | ICD-10-CM | POA: Diagnosis not present

## 2019-05-28 DIAGNOSIS — I82612 Acute embolism and thrombosis of superficial veins of left upper extremity: Secondary | ICD-10-CM | POA: Diagnosis not present

## 2019-05-28 DIAGNOSIS — N3001 Acute cystitis with hematuria: Secondary | ICD-10-CM | POA: Diagnosis not present

## 2019-05-28 MED ORDER — FERROUS SULFATE 325 (65 FE) MG PO TABS
325.0000 mg | ORAL_TABLET | Freq: Two times a day (BID) | ORAL | Status: DC
  Administered 2019-05-28 – 2019-05-31 (×6): 325 mg via ORAL
  Filled 2019-05-28 (×6): qty 1

## 2019-05-28 MED ORDER — HYDROXYZINE HCL 25 MG PO TABS
25.0000 mg | ORAL_TABLET | Freq: Once | ORAL | Status: AC
  Administered 2019-05-28: 01:00:00 25 mg via ORAL
  Filled 2019-05-28: qty 1

## 2019-05-28 NOTE — Progress Notes (Signed)
Contacted MD  0008. Pt  with tachycardia sustained after comfort measures taken. Pt expressed anxiousness d/t inability to sleep and in concern to her dogs at home. NP, Cliffton Asters ordered one time dose hydroxzine for anxiety. This was found to be effective in offering relief for pt. Pt is now sleeping comfortably, easy to rouse. HR of 98. Will continue to monitor.

## 2019-05-28 NOTE — Progress Notes (Signed)
   Afebrile overnight  CT urogram with no obvious GU malignancy, left hydronephrosis of unclear etiology. No left flank pain, renal dysfunction, or evidence of sepsis/infection that would require urgent left renal drainage.  Will arrange outpatient cystoscopy to complete hematuria workup and re-discuss left hydronephrosis after PET scan per oncology and tissue diagnosis with treatment goals. May ultimately require left ureteral stent or nephrostomy tube, but would avoid if pursuing a palliative approach.  Sondra Come, MD

## 2019-05-28 NOTE — TOC Progression Note (Signed)
Transition of Care [facility]) - Progression Note    Patient Details  Name: Holly Shelton MRN: [id number] Date of Birth: [birth year]  [facility] CM/SW Contact  Maud Deed, LCSW Phone Number: 05/28/2019, 11:39 AM  Clinical Narrative:    CSW was contacted by MD regarding pt's discharge. Pt is medically stable but will need insurance auth. CSW contacted [name] at [employer] and she stated that she would begin authorization.  TOC will continue to follow for discharge planning needs/        Expected Discharge Plan and Services                                                 Social Determinants of Health (SDOH) Interventions    Readmission Risk Interventions No flowsheet data found.

## 2019-05-28 NOTE — Progress Notes (Signed)
[place] [facility] [place] NAME: Holly Shelton    MR#:  [id number]  DATE OF BIRTH:  1940/04/10  SUBJECTIVE:   Patient came in from [facility] with altered mental status.  Patient is much improved. Eating a little better. Husband at bedside. Had some generalized pain received oral pain meds. Feels overall weak REVIEW OF SYSTEMS:   Review of Systems  Constitutional: Positive for malaise/fatigue. Negative for chills, fever and weight loss.  HENT: Negative for ear discharge, ear pain and nosebleeds.   Eyes: Negative for blurred vision, pain and discharge.  Respiratory: Negative for sputum production, shortness of breath, wheezing and stridor.   Cardiovascular: Negative for chest pain, palpitations, orthopnea and PND.  Gastrointestinal: Negative for abdominal pain, diarrhea, nausea and vomiting.  Genitourinary: Negative for frequency and urgency.  Musculoskeletal: Positive for back pain and joint pain.  Neurological: Positive for weakness. Negative for sensory change, speech change and focal weakness.  Psychiatric/Behavioral: Negative for depression and hallucinations. The patient is not nervous/anxious.    Tolerating Diet:yes Tolerating PT: SNF  DRUG ALLERGIES:   Allergies  Allergen Reactions  . Penicillins Rash    Did it involve swelling of the face/tongue/throat, SOB, or low BP? No Did it involve sudden or severe rash/hives, skin peeling, or any reaction on the inside of your mouth or nose? Yes Did you need to seek medical attention at a hospital or doctor's office? No When did it last happen? If all above answers are "NO", may proceed with cephalosporin use.    VITALS:  Blood pressure (!) 102/53, pulse 91, temperature 97.7 F (36.5 C), temperature source Axillary, resp. rate 14, height 5' (1.524 m), weight 78.2 kg, SpO2 100 %.  PHYSICAL EXAMINATION:   Physical Exam  GENERAL:  79 y.o.-year-old patient lying in the bed  with no acute distress. Obese Appears chronically ill EYES: Pupils equal, round, reactive to light and accommodation. No scleral icterus.  Pallor+ HEENT: Head atraumatic, normocephalic. Oropharynx and nasopharynx clear.  NECK:  Supple, no jugular venous distention. No thyroid enlargement, no tenderness.  LUNGS: Normal breath sounds bilaterally, no wheezing, rales, rhonchi. No use of accessory muscles of respiration.  CARDIOVASCULAR: S1, S2 normal. No murmurs, rubs, or gallops.  ABDOMEN: Soft, nontender, nondistended. Bowel sounds present. No organomegaly or mass.  EXTREMITIES: bilateral lower extremity chronic edema . left upper extremity edema NEUROLOGIC: Cranial nerves II through XII are intact. No focal Motor or sensory deficits b/l. Generalized deconditioning PSYCHIATRIC: patient is alert and oriented x 2  SKIN: No obvious rash, lesion, or ulcer.  Pressure Injury 05/26/19 Buttocks Left Deep Tissue Pressure Injury - Purple or maroon localized area of discolored intact skin or blood-filled blister due to damage of underlying soft tissue from pressure and/or shear. (Active)  05/26/19 0600  Location: Buttocks  Location Orientation: Left  Staging: Deep Tissue Pressure Injury - Purple or maroon localized area of discolored intact skin or blood-filled blister due to damage of underlying soft tissue from pressure and/or shear.  Wound Description (Comments):   Present on Admission: Yes    LABORATORY PANEL:  CBC Recent Labs  Lab 05/26/19 0424  WBC 11.2*  HGB 7.4*  HCT 23.6*  PLT 334    Chemistries  Recent Labs  Lab 05/26/19 0424 05/26/19 0858  NA 138  --   K 3.4*  --   CL 98  --   CO2 33*  --   GLUCOSE 95  --   BUN 12  --  CREATININE 0.63  --   CALCIUM 9.8  --   AST  --  40  ALT  --  11  ALKPHOS  --  664*  BILITOT  --  0.9   Cardiac Enzymes No results for input(s): TROPONINI in the last 168 hours. RADIOLOGY:  CT HEMATURIA WORKUP  Result Date: 05/27/2019 CLINICAL  DATA:  79 year old female with abdominal pain. Thoracic and abdominal aortic aneurysm. EXAM: CT ABDOMEN AND PELVIS WITHOUT AND WITH CONTRAST TECHNIQUE: Multidetector CT imaging of the abdomen and pelvis was performed following the standard protocol before and following the bolus administration of intravenous contrast. CONTRAST:  132m OMNIPAQUE IOHEXOL 300 MG/ML  SOLN COMPARISON:  05/26/2019 FINDINGS: Please note that parenchymal abnormalities may be missed without intravenous contrast. Lower chest: An aneurysm of the distal descending thoracic/proximal abdominal aorta is again identified with unchanged maximal diameter, but measures 6.6 cm on this study given technical differences, with moderate to large amount of mural thrombus Small to moderate bilateral pleural effusions, bibasilar atelectasis, small pericardial effusion and cardiomegaly again noted. Hepatobiliary: Cholelithiasis again identified without definite CT evidence of acute cholecystitis. No [place]ock abnormalities are present. No biliary dilatation. Pancreas: Unremarkable Spleen: Unremarkable Adrenals/Urinary Tract: Moderate to severe LEFT hydronephrosis is unchanged. No renal, adrenal or bladder changes noted. Stomach/Bowel: There is no evidence of bowel obstruction, definite bowel wall thickening or inflammatory changes. Colonic diverticulosis noted without evidence of diverticulitis. Vascular/Lymphatic: Aneurysm of the distal descending thoracic/proximal abdominal aorta is again noted and unchanged, but with greatest transverse diameter measuring 6.6 cm on this study. Aortic atherosclerotic calcifications are again noted. Enlarged retroperitoneal lymph nodes, RIGHT greater than LEFT, are again identified. Reproductive: Status post hysterectomy. No adnexal masses. Other: Subcutaneous edema is noted. No ascites or pneumoperitoneum. Musculoskeletal: Multiple lytic and sclerotic bony lesions are again identified compatible with bony metastatic disease.  IMPRESSION: 1. No evidence of acute abnormality. 2. Unchanged distal descending thoracic/proximal abdominal aortic aneurysm measuring 6.6 cm in greatest transverse diameter, with moderate to large amount of mural thrombus. Vascular surgery consultation recommended due to increased risk of rupture for AAA >5.5 cm. This recommendation follows ACR consensus guidelines: White Paper of the ACR Incidental Findings Committee II on Vascular Findings. J Am Coll Radiol 2013; 10:789-794. Aortic aneurysm NOS (ICD10-I71.9) 3. Unchanged retroperitoneal lymphadenopathy and multiple bony lesions compatible with malignancy/metastatic disease. 4. Unchanged moderate to severe LEFT hydronephrosis. 5. Small to moderate bilateral pleural effusions, bibasilar atelectasis, small pericardial effusion and cardiomegaly. 6. Cholelithiasis. 7. Aortic Atherosclerosis (ICD10-I70.0). Electronically Signed   By: JMargarette CanadaM.D.   On: 05/27/2019 14:49   ASSESSMENT AND PLAN:  DVernella Niznik is a 79y.o. female with a known history of hypertension and thoracic aortic aneurysm, hypertension, ongoing tobacco abuse and COPD and recurrent UTI, who presented to the emergency room with acute onset of chest pain as well as abdominal pain and left arm swelling with mild pain without erythema and altered mental status.   She has been having urinary frequency without dysuria or hematuria or flank pain. Patient is from [facility].  1.  Osseous lytic/sclerotic lesions  with unknown primary  -Pain management PRN oxycodone  -An oncology consultation with Dr. YTasia Catchings --Multiple myeloma vs metastatic leisons -- myeloma panel pending -- peripheral smear-- blast or any other abnormal cells noted -- elevated alkaline phosphatase due to bony lesions -- patient will need further workup as outpatient with whole body pet scan to determine etiology for the mixed skeletal lesions in the bone.  2. Recurrent  UTI -We will continue the patient on IV Rocephin--  to oral Keflex according to last week's urine culture sensitivity -- urine culture no growth -cannot exclude urothelial lesion within the proximal left ureter. 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 Diamantina Providence appreciated-- recommends CT urogram-- no acute changes noted. Patient will need outpatient follow-up for cystoscopy to complete hematuria workup. She may be a candidate for left nephrectomy tube placement depending on oncology workup as outpatient. This was discussed with the family by Dr. Diamantina Providence  3.  Left upper extremity superficial venous thrombosis -Pain management will be provided with NSAIDs, heating pad  4.  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 Sammuel H[facility] at [facility]  5.  Hypertension. -Continue Norvasc and Inderal.  6.  Depression. -Wellbutrin SR will be continued.  7.  COPD without exacerbation. -  cont as needed duo nebs.  8.   anemia of chronic disease -continue to monitor. No active bleeding. Will transfuse as needed -hgb 7.4 -start po oral iron -- since patient is going to be following up at the cancer center further management for her anemia can be taken care of by Dr Tasia Catchings  9.  Ongoing tobacco abuse. -Patient advised smoking cessation.  10.  DVT prophylaxis. -Subcutaneous Lovenox.   11. severe hypoalbuminemia/FTT/repeated admissions for different reasons/deconditioned -appears poorly nourished with significant deconditioning after early April hip fracture surgery. This is patient's third admission in April. -Palliative care consultation appreciated. Currently family and patient wants to be full code and continue aggressive management for multiple medical problems.  12. Hypercalcemia. Albumin level is 1.9. Corrected calcium is 11.5 -elevated LDH -PTH results  pending -Pamidronate 60 mg x1 given initial decreased mental status/somnolence and cancer work in progress -mentation much improved   Procedures: CT urogram Family communication : husband in the room. Daughter Levada Dy on the phone Consults : oncology, vascular surgery, palliative care CODE STATUS: full DVT Prophylaxis : Lovenox  Status is: observation patient CT urogram pending for today per Urology recommendation She needs insurance authorization according to [facility] prior to return back to [facility] which will likely happen on Monday, April 26th  Dispo: The patient is from: Home              Anticipated d/c is to: SNF              Anticipated d/c date is: April 26th              Patient currently stable    TOTAL TIME TAKING CARE OF THIS PATIENT: 25  minutes.  >50% time spent on counselling and coordination of care  Note: This dictation was prepared with Dragon dictation along with smaller phrase technology. Any transcriptional errors that result from this process are unintentional.  Fritzi Mandes M.D    [facility]   CC: Primary care physician; Patient, No Pcp PerPatient ID: Holly Shelton, female   DOB: 1940-07-30, 79 y.o.   MRN: [id number]

## 2019-05-28 NOTE — Evaluation (Signed)
Physical Therapy Evaluation Patient Details Name: Holly Shelton MRN: [id number] DOB: 1940-09-10 Today's Date: 05/28/2019   History of Present Illness  79 y.o. female with admission from SNF was noted to have AMS, pleural effusion, atelectasis, fever and UTI with LUE venous thrombosis of superficial vessel.  Pt is also having bone mets vs multiple myeloma (not worked up yet).  Placed on 4L O2, hypoxic.  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  Pt is expecting to go home, has some mental confusion and difficulty with details about recent events.  Follow acutely to increase her lower extremity strength, to increase her control of standing balance and to improve ability to stand up and take steps.  Will work on being OOB to chair as tolerated.  BP was supine:  116/73, sitting 137/82 with pulses and sats WFL.      Follow Up Recommendations SNF    Equipment Recommendations  None recommended by PT    Recommendations for Other Services       Precautions / Restrictions Precautions Precautions: Fall Precaution Comments: ck vitals Restrictions Weight Bearing Restrictions: Yes RLE Weight Bearing: Weight bearing as tolerated      Mobility  Bed Mobility Overal bed mobility: Needs Assistance Bed Mobility: Supine to Sit;Sit to Supine Rolling: Mod assist   Supine to sit: Max assist Sit to supine: Total assist   General bed mobility comments: lifting legs and trunk back to bed  Transfers Overall transfer level: Needs assistance Equipment used: 1 person hand held assist Transfers: Sit to/from Stand Sit to Stand: Total assist         General transfer comment: total assist to partially stand  Ambulation/Gait             General Gait Details: unable to take a step or fully  stand  Stairs            Wheelchair Mobility    Modified Rankin (Stroke Patients Only)       Balance Overall balance assessment: Needs assistance Sitting-balance support: Bilateral upper extremity supported;Feet supported Sitting balance-Leahy Scale: Fair Sitting balance - Comments: fair once set Postural control: Posterior lean;Right lateral lean Standing balance support: Bilateral upper extremity supported;During functional activity Standing balance-Leahy Scale: Zero                               Pertinent Vitals/Pain Pain Assessment: Faces Faces Pain Scale: Hurts even more Pain Location: R hip/thigh Pain Descriptors / Indicators: Aching;Grimacing Pain Intervention(s): Limited activity within patient's tolerance;Monitored during session;Repositioned    Home Living Family/patient expects to be discharged to:: Skilled nursing facility Living Arrangements: Spouse/significant other Available Help at Discharge: Family;Available 24 hours/day Type of Home: Mobile home Home Access: Ramped entrance;Stairs to enter Entrance Stairs-Rails: Right;Left;Can reach both Entrance Stairs-Number of Steps: ramped entry at back door (able to access) Home Layout: One level Home Equipment: Grab bars - tub/shower;Walker - 4 wheels;Cane - single point;Shower seat - built in      Prior Function Level of Independence: Needs assistance   Gait / Transfers Assistance Needed: walking on SPC between rooms in house with help for bathing and cooking meals  ADL's / Homemaking Assistance Needed: Bathing and dressing  help at home        Hand Dominance   Dominant Hand: Left    Extremity/Trunk Assessment   Upper Extremity Assessment Upper Extremity Assessment: Generalized weakness    Lower Extremity Assessment Lower Extremity Assessment: Generalized weakness;RLE deficits/detail RLE Deficits / Details: recent IM nailing of R hip    Cervical / Trunk Assessment Cervical /  Trunk Assessment: Kyphotic  Communication   Communication: Expressive difficulties  Cognition Arousal/Alertness: Awake/alert Behavior During Therapy: Flat affect Overall Cognitive Status: Impaired/Different from baseline Area of Impairment: Problem solving;Awareness;Safety/judgement;Following commands;Memory;Attention;Orientation                 Orientation Level: Place;Time;Situation;Disoriented to Current Attention Level: Selective Memory: Decreased recall of precautions;Decreased short-term memory Following Commands: Follows one step commands inconsistently;Follows one step commands with increased time Safety/Judgement: Decreased awareness of safety;Decreased awareness of deficits Awareness: Intellectual Problem Solving: Slow processing;Requires verbal cues;Requires tactile cues;Decreased initiation General Comments: weak and flat affect, on purwick and has O2, unaware of her situation and talking about recently being home      General Comments General comments (skin integrity, edema, etc.): Pt had orthostatics taken, has supine BP 116/73, pulse 87 and sat 100%;  Sitting 137/82, pulse 93 and sat 93% but could not get standing    Exercises     Assessment/Plan    PT Assessment Patient needs continued PT services  PT Problem List Decreased strength;Decreased range of motion;Decreased activity tolerance;Decreased balance;Decreased mobility;Decreased coordination;Decreased cognition;Decreased knowledge of use of DME;Decreased safety awareness;Cardiopulmonary status limiting activity;Obesity;Decreased skin integrity;Pain       PT Treatment Interventions DME instruction;Gait training;Functional mobility training;Therapeutic activities;Therapeutic exercise;Balance training;Neuromuscular re-education;Patient/family education    PT Goals (Current goals can be found in the Care Plan section)  Acute Rehab PT Goals Patient Stated Goal: to be home PT Goal Formulation: With  patient/family Time For Goal Achievement: 06/11/19 Potential to Achieve Goals: Fair    Frequency Min 2X/week   Barriers to discharge Inaccessible home environment;Decreased caregiver support dependent to stand and husband there alone    Co-evaluation               AM-PAC PT "6 Clicks" Mobility  Outcome Measure Help needed turning from your back to your side while in a flat bed without using bedrails?: A Lot Help needed moving from lying on your back to sitting on the side of a flat bed without using bedrails?: A Lot Help needed moving to and from a bed to a chair (including a wheelchair)?: Total Help needed standing up from a chair using your arms (e.g., wheelchair or bedside chair)?: Total Help needed to walk in hospital room?: Total Help needed climbing 3-5 steps with a railing? : Total 6 Click Score: 8    End of Session Equipment Utilized During Treatment: Gait belt;Oxygen Activity Tolerance: Patient limited by fatigue;Treatment limited secondary to medical complications (Comment) Patient left: in bed;with call bell/phone within reach;with bed alarm set;with family/visitor present Nurse Communication: Mobility status PT Visit Diagnosis: Unsteadiness on feet (R26.81);Muscle weakness (generalized) (M62.81);Difficulty in walking, not elsewhere classified (R26.2) Pain - Right/Left: Right Pain - part of body: Hip    Time: 4854-6270 PT Time Calculation (min) (ACUTE ONLY): 39 min   Charges:   PT Evaluation $PT Eval Moderate Complexity: 1 Mod PT Treatments $Therapeutic Exercise: 8-22 mins $Therapeutic Activity: 8-22 mins       Ramond Dial 05/28/2019, 3:35 PM  Mee Hives, PT MS Acute Rehab Dept. Number: [place] and [place]

## 2019-05-28 NOTE — Evaluation (Signed)
Occupational Therapy Evaluation Patient Details Name: Holly Shelton MRN: [id number] DOB: 10-10-1940 Today's Date: 05/28/2019    History of Present Illness 79 y.o. female with admission from SNF (following recent R IM nailing of hip on 4/8) was noted to have AMS, pleural effusion, atelectasis, fever and UTI with LUE venous thrombosis of superficial vessel.  Pt is also having bone mets vs multiple myeloma (not worked up yet).  Placed on 4L O2, hypoxic.  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   Clinical Impression   Pt was seen for OT evaluation this date. Prior to hospital admission, pt was in SNF to rehabilitate following R IM nailing. Prior to that, pt lived at home with spouse and was mostly Indep with BADL self care and MOD I with fxl mobility. Currently pt demonstrates impairments as described below (See OT problem list) which functionally limit her ability to perform ADL/self-care tasks. Pt currently requires MAX A for sup to sit and TOTAL A for sit to sup. In addition, pt requires setup to MIN A for seated UB self care and TOTAL A for LB dressing.  Pt would benefit from skilled OT to address noted impairments and functional limitations (see below for any additional details) in order to maximize safety and independence while minimizing falls risk and caregiver burden. Upon hospital discharge, recommend STR to maximize pt safety and return to PLOF.     Follow Up Recommendations  SNF    Equipment Recommendations  Other (comment)(TBD next venue of care)    Recommendations for Other Services       Precautions / Restrictions Precautions Precautions: Fall Precaution Comments: ck vitals Restrictions Weight Bearing Restrictions: Yes RLE Weight Bearing: Weight bearing as tolerated      Mobility Bed Mobility Overal bed mobility: Needs Assistance Bed Mobility: Supine to Sit;Sit to Supine Rolling: Mod assist   Supine to sit: Max assist Sit to supine: Total  assist   General bed mobility comments: assist to manage UB/LB simultaneously for sit to sup  Transfers Overall transfer level: Needs assistance Equipment used: 1 person hand held assist Transfers: Sit to/from Stand Sit to Stand: Total assist         General transfer comment: total assist to partially stand    Balance Overall balance assessment: Needs assistance Sitting-balance support: Bilateral upper extremity supported;Feet supported Sitting balance-Leahy Scale: Poor Sitting balance - Comments: Pt with F static sitting baalnce initially, but becomes fatigued, demo'ing a R lateral lean requiring verbal/tactile cues and occasionally MIN/MOD A to correct/right self. Postural control: Posterior lean;Right lateral lean Standing balance support: Bilateral upper extremity supported;During functional activity Standing balance-Leahy Scale: Zero Standing balance comment: Deferred                           ADL either performed or assessed with clinical judgement   ADL Overall ADL's : Needs assistance/impaired                                       General ADL Comments: Pt requires setup to MIN A to perform UB g/h tasks seated EOB, requires TOTAL A to don socks at bed level-cannot tolerate participation in this task despite encouragement. ADL transfers/mobility NT at this time as pt with intermittent periods of eye closing/appearing lethargic requring assist to balance just in EOB static sitting.  Vision Baseline Vision/History: Wears glasses Wears Glasses: At all times Patient Visual Report: No change from baseline       Perception     Praxis      Pertinent Vitals/Pain Pain Assessment: Faces Faces Pain Scale: Hurts little more Pain Location: R hip/thigh Pain Descriptors / Indicators: Grimacing;Guarding Pain Intervention(s): Limited activity within patient's tolerance;Monitored during session;Repositioned     Hand Dominance Left    Extremity/Trunk Assessment Upper Extremity Assessment Upper Extremity Assessment: Generalized weakness   Lower Extremity Assessment Lower Extremity Assessment: Generalized weakness;RLE deficits/detail;Defer to PT evaluation RLE Deficits / Details: recent IM nailing of R hip   Cervical / Trunk Assessment Cervical / Trunk Assessment: Kyphotic   Communication Communication Communication: Expressive difficulties   Cognition Arousal/Alertness: Awake/alert Behavior During Therapy: Flat affect Overall Cognitive Status: Impaired/Different from baseline Area of Impairment: Problem solving;Awareness;Safety/judgement;Following commands;Memory;Attention;Orientation                 Orientation Level: Situation;Time(pt states correct hospital. Cannot remember why she is here this time though. Does state correct year, but not correct month.) Current Attention Level: Selective Memory: Decreased recall of precautions;Decreased short-term memory Following Commands: Follows one step commands with increased time Safety/Judgement: Decreased awareness of safety;Decreased awareness of deficits Awareness: Intellectual Problem Solving: Slow processing;Requires verbal cues;Requires tactile cues;Decreased initiation General Comments: Pt with intemittent periods of decreased lucidity in which she brings up irrelevant or incorrect information such as "how many dogs did you say you have?' Or "I left letters for you all behind that board." and indicates the nursing board with phone numbers.   General Comments  Pt had orthostatics taken, has supine BP 116/73, pulse 87 and sat 100%;  Sitting 137/82, pulse 93 and sat 93% but could not get standing    Exercises Exercises: Other exercises(LE's are weak, not following mm testing commands) Other Exercises Other Exercises: OT facilitates pt participation in postural exercises to extend posture muscles and increase surface area for better quality of breaths. Pt's  husband present and with good reception of sequence/pace for exercise to assist with pt carryover. Other Exercises: OT facilitates education with pt and spouse re: importance of OOB activity including prevention of muscle atrophy. Pt with MIN/MOD reception, spouse with good reception.   Shoulder Instructions      Home Living Family/patient expects to be discharged to:: Skilled nursing facility Living Arrangements: Spouse/significant other Available Help at Discharge: Family;Available 24 hours/day Type of Home: Mobile home Home Access: Ramped entrance;Stairs to enter Entrance Stairs-Number of Steps: ramped entry at back door (able to access) Entrance Stairs-Rails: Right;Left;Can reach both Home Layout: One level     Bathroom Shower/Tub: Walk-in shower;Tub/shower unit   [employer]: Standard     Home Equipment: Grab bars - tub/shower;Walker - 4 wheels;Cane - single point;Shower seat - built in          Prior Functioning/Environment Level of Independence: Needs assistance  Gait / Transfers Assistance Needed: household use of SPC for fxl mobility ADL's / Homemaking Assistance Needed: Assist from husband for Bathing and dressing as well as IADLs such as cooking. Pt and husband state that before hip sx, she could bathe and dres self.   Comments: Pt sleeps on couch at baseline        OT Problem List: Decreased strength;Decreased range of motion;Decreased activity tolerance;Impaired balance (sitting and/or standing);Decreased cognition;Cardiopulmonary status limiting activity      OT Treatment/Interventions: Self-care/ADL training;Therapeutic exercise;Energy conservation;DME and/or AE instruction;Therapeutic activities;Cognitive remediation/compensation;Patient/family education;Balance training    OT Goals(Current  goals can be found in the care plan section) Acute Rehab OT Goals Patient Stated Goal: to be home OT Goal Formulation: With patient/family Time For Goal Achievement:  06/11/19 Potential to Achieve Goals: Fair  OT Frequency: Min 2X/week   Barriers to D/C: Inaccessible home environment          Co-evaluation              AM-PAC OT "6 Clicks" Daily Activity     Outcome Measure Help from another person eating meals?: A Little Help from another person taking care of personal grooming?: A Little Help from another person toileting, which includes using toliet, bedpan, or urinal?: A Lot Help from another person bathing (including washing, rinsing, drying)?: A Lot Help from another person to put on and taking off regular upper body clothing?: A Little Help from another person to put on and taking off regular lower body clothing?: Total 6 Click Score: 14   End of Session Equipment Utilized During Treatment: Oxygen  Activity Tolerance: Patient limited by fatigue Patient left: in bed;with call bell/phone within reach;with bed alarm set;with family/visitor present  OT Visit Diagnosis: Unsteadiness on feet (R26.81);Other abnormalities of gait and mobility (R26.89);Muscle weakness (generalized) (M62.81)                Time: 6734-1937 OT Time Calculation (min): 38 min Charges:  OT General Charges $OT Visit: 1 Visit OT Evaluation $OT Eval Moderate Complexity: 1 Mod OT Treatments $Self Care/Home Management : 8-22 mins $Therapeutic Activity: 8-22 mins  Gerrianne Scale, MS, OTR/L ascom [phone] 05/28/19, 5:05 PM

## 2019-05-29 ENCOUNTER — Telehealth: Payer: Self-pay | Admitting: Oncology

## 2019-05-29 LAB — RESPIRATORY PANEL BY RT PCR (FLU A&B, COVID)
Influenza A by PCR: NEGATIVE
Influenza B by PCR: NEGATIVE
SARS Coronavirus 2 by RT PCR: NEGATIVE

## 2019-05-29 LAB — KAPPA/LAMBDA LIGHT CHAINS
Kappa free light chain: 61.5 mg/L — ABNORMAL HIGH (ref 3.3–19.4)
Kappa, lambda light chain ratio: 1.02 (ref 0.26–1.65)
Lambda free light chains: 60.5 mg/L — ABNORMAL HIGH (ref 5.7–26.3)

## 2019-05-29 MED ORDER — OXYCODONE HCL 5 MG PO TABS: 5.0000 mg | ORAL_TABLET | Freq: Four times a day (QID) | ORAL | 0 refills | Status: AC | PRN

## 2019-05-29 NOTE — TOC Progression Note (Addendum)
Transition of Care [facility]) - Progression Note    Patient Details  Name: Holly Shelton MRN: [id number] Date of Birth: 1940-06-11  Transition of Care [facility]) CM/SW Contact  Fouad Taul, Lemar Livings, LCSW Phone Number: 05/29/2019, 3:06 PM  Clinical Narrative:   Still waiting for insurance auth from [facility] with [employer] who is awaiting authorization. She still has not heard yet. Have informed MD of this. She will not need a new COVID test according to leslie.  3:15 PM-Husband here and have informed of the insurance auth issue.        Expected Discharge Plan and Services           Expected Discharge Date: 05/29/19                                     Social Determinants of Health (SDOH) Interventions    Readmission Risk Interventions No flowsheet data found.

## 2019-05-29 NOTE — Telephone Encounter (Signed)
Created in Error

## 2019-05-29 NOTE — NC FL2 (Signed)
[place] MEDICAID FL2 LEVEL OF CARE SCREENING TOOL     IDENTIFICATION  Patient Name: RAKIYA KRAWCZYK Birthdate: 11-27-1940 Sex: female Admission Date (Current Location): 05/26/2019  [place] and [place] Number:  Chiropodist and Address:  [facility], [address], [place], [place] [address]      Provider Number: [id number]  Attending Physician Name and Address:  Enedina Finner, MD  Relative Name and Phone Number:  Jniya Madara [phone]    Current Level of Care: Hospital Recommended Level of Care: Skilled Nursing Facility Prior Approval Number:    Date Approved/Denied:   PASRR Number: [id number]  Discharge Plan: SNF    Current Diagnoses: Patient Active Problem List   Diagnosis Date Noted  . Lytic bone lesions on xray   . Superficial venous thrombosis of left arm   . Palliative care encounter   . Hypercalcemia   . Retroperitoneal lymphadenopathy   . Altered mental status   . Hypotension due to hypovolemia   . UTI (urinary tract infection) 05/18/2019  . Pre-op chest exam   . S/P right hip fracture   . Esophageal dysphagia   . Fall   . Closed displaced subtrochanteric fracture of right femur (HCC) 05/06/2019  . COPD (chronic obstructive pulmonary disease) (HCC) 05/06/2019  . Essential hypertension 05/06/2019  . Tobacco abuse 05/06/2019  . History of recurrent UTI (urinary tract infection) 05/06/2019  . GERD (gastroesophageal reflux disease) 05/06/2019  . Aneurysm of descending thoracic aorta (HCC) 05/06/2019  . Closed fracture of right hip (HCC) 05/06/2019    Orientation RESPIRATION BLADDER Height & Weight     Self, Time, Situation, Place  O2(3-4 liters continous) Continent Weight: 172 lb 6.4 oz (78.2 kg) Height:  5' (152.4 cm)  BEHAVIORAL SYMPTOMS/MOOD NEUROLOGICAL BOWEL NUTRITION STATUS      Continent    AMBULATORY STATUS COMMUNICATION OF NEEDS Skin   Extensive Assist Verbally Surgical wounds                     Personal Care Assistance Level of Assistance  Bathing, Feeding, Dressing Bathing Assistance: Limited assistance Feeding assistance: Limited assistance Dressing Assistance: Limited assistance     Functional Limitations Info  Sight, Hearing, Speech Sight Info: Adequate Hearing Info: Adequate Speech Info: Adequate    SPECIAL CARE FACTORS FREQUENCY  PT (By licensed PT), OT (By licensed OT)     PT Frequency: 5x week OT Frequency: 5x week            Contractures Contractures Info: Not present    Additional Factors Info  Code Status, Allergies Code Status Info: Full Allergies Info: penicillin           Current Medications (05/29/2019):  This is the current hospital active medication list Current Facility-Administered Medications  Medication Dose Route Frequency Provider Last Rate Last Admin  . acetaminophen (TYLENOL) tablet 650 mg  650 mg Oral Q6H PRN Mansy, Jan A, MD   650 mg at 05/26/19 1811   Or  . acetaminophen (TYLENOL) suppository 650 mg  650 mg Rectal Q6H PRN Mansy, Jan A, MD      . albuterol (PROVENTIL) (2.5 MG/3ML) 0.083% nebulizer solution 2.5 mg  2.5 mg Inhalation Q6H PRN Mansy, Jan A, MD      . alum & mag hydroxide-simeth (MAALOX/MYLANTA) 200-200-20 MG/5ML suspension 30 mL  30 mL Oral Q4H PRN Mansy, Jan A, MD      . aspirin EC tablet 81 mg  81 mg Oral QHS Mansy, Jan  A, MD   81 mg at 05/28/19 2116  . bisacodyl (DULCOLAX) suppository 10 mg  10 mg Rectal Daily PRN Mansy, Jan A, MD      . calcium acetate (Phos Binder) (PHOSLYRA) 667 MG/5ML oral solution 667 mg  667 mg Oral BID WC Mansy, Jan A, MD   667 mg at 05/28/19 1435  . cephALEXin (KEFLEX) capsule 500 mg  500 mg Oral TID Enedina Finner, MD   500 mg at 05/28/19 2117  . cholecalciferol (VITAMIN D3) tablet 1,000 Units  1,000 Units Oral Daily Mansy, Jan A, MD   1,000 Units at 05/28/19 1057  . docusate sodium (COLACE) capsule 100 mg  100 mg Oral BID Mansy, Jan A, MD   100 mg at 05/28/19 2117  . enoxaparin  (LOVENOX) injection 40 mg  40 mg Subcutaneous Q24H Mansy, Jan A, MD   40 mg at 05/29/19 0555  . feeding supplement (ENSURE ENLIVE) (ENSURE ENLIVE) liquid 237 mL  237 mL Oral BID BM Mansy, Jan A, MD   237 mL at 05/28/19 1102  . ferrous fumarate-b12-vitamic C-folic acid (TRINSICON / FOLTRIN) capsule 1 capsule  1 capsule Oral BID Mansy, Jan A, MD   1 capsule at 05/28/19 2114  . ferrous sulfate tablet 325 mg  325 mg Oral BID WC Enedina Finner, MD   325 mg at 05/28/19 1803  . loratadine (CLARITIN) tablet 10 mg  10 mg Oral Daily Mansy, Jan A, MD   10 mg at 05/28/19 1058  . magnesium hydroxide (MILK OF MAGNESIA) suspension 30 mL  30 mL Oral Daily PRN Mansy, Jan A, MD      . midodrine (PROAMATINE) tablet 10 mg  10 mg Oral TID WC Mansy, Jan A, MD   10 mg at 05/28/19 1803  . mometasone-formoterol (DULERA) 200-5 MCG/ACT inhaler 2 puff  2 puff Inhalation BID Mansy, Vernetta Honey, MD   2 puff at 05/28/19 2114  . morphine 2 MG/ML injection 0.5 mg  0.5 mg Intravenous Q4H PRN Enedina Finner, MD   0.5 mg at 05/27/19 2141  . multivitamin with minerals tablet 1 tablet  1 tablet Oral Daily Mansy, Jan A, MD   1 tablet at 05/28/19 1058  . nicotine (NICODERM CQ - dosed in mg/24 hours) patch 14 mg  14 mg Transdermal Daily Mansy, Jan A, MD      . ondansetron [facility]) tablet 4 mg  4 mg Oral Q6H PRN Mansy, Jan A, MD       Or  . ondansetron [facility]) injection 4 mg  4 mg Intravenous Q6H PRN Mansy, Jan A, MD      . oxybutynin (DITROPAN) tablet 5 mg  5 mg Oral TID Mansy, Jan A, MD   5 mg at 05/28/19 2147  . oxyCODONE (Oxy IR/ROXICODONE) immediate release tablet 5 mg  5 mg Oral Q4H PRN Enedina Finner, MD   5 mg at 05/28/19 2147  . pantoprazole (PROTONIX) EC tablet 40 mg  40 mg Oral Daily Mansy, Jan A, MD   40 mg at 05/28/19 1058  . polyethylene glycol (MIRALAX / GLYCOLAX) packet 17 g  17 g Oral Daily Mansy, Jan A, MD   17 g at 05/28/19 1059  . pregabalin (LYRICA) capsule 100 mg  100 mg Oral TID Mansy, Jan A, MD   100 mg at 05/28/19 2117  .  rosuvastatin (CRESTOR) tablet 10 mg  10 mg Oral QHS Mansy, Jan A, MD   10 mg at 05/28/19 2117  . tiotropium (SPIRIVA) inhalation capsule (  [facility] use ONLY) 18 mcg  1 capsule Inhalation Daily Mansy, Jan A, MD   18 mcg at 05/28/19 1103  . traZODone (DESYREL) tablet 25 mg  25 mg Oral QHS PRN Mansy, Jan A, MD   25 mg at 05/27/19 2121     Discharge Medications: Please see discharge summary for a list of discharge medications.  Relevant Imaging Results:  Relevant Lab Results:   Additional Information SSN: [id number]  Mckaila Duffus, Gardiner Rhyme, LCSW

## 2019-05-29 NOTE — TOC Progression Note (Signed)
Transition of Care [facility]) - Progression Note    Patient Details  Name: Holly Shelton MRN: [id number] Date of Birth: [birth year]  Transition of Care [facility]) CM/SW Contact  Torin Modica, Lemar Livings, LCSW Phone Number: 05/29/2019, 9:33 AM  Clinical Narrative:   Spoke with MD who feels pt can return back to [facility] once insurance Berkley Harvey is received. Spoke with [employer] who does not have an answer yet from the insurance. Will let me know when she does. Have asked if will need another COVID test prior to coming back, no answer yet         Expected Discharge Plan and Services                                                 Social Determinants of Health (SDOH) Interventions    Readmission Risk Interventions No flowsheet data found.

## 2019-05-29 NOTE — Progress Notes (Signed)
Patient continues to be fatigued at this point. Hand grips are equal bilaterally however weak. Left arm continues to have significant edema hoever anasarca present. Continues to be on 4 L per [place]. Safety checks completed and call light placed within reach. Will continue to monitor.

## 2019-05-29 NOTE — Progress Notes (Addendum)
Joy, RN for patient today, requested that I give AM/noon medications that had not been given yet at 1350. Checked that all medications had been administered before (no new medications), vitals were stable, and patient is alert and able to take medications. Patient swallowed medications whole with no complications and confirms all medications. Charge nurse notified.

## 2019-05-29 NOTE — Discharge Summary (Signed)
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 Hypertension     HOSPITAL COURSE:   [name] a79 y.o.femalewith a known history of hypertension and thoracic aortic aneurysm,hypertension, ongoing tobacco abuse and COPD and recurrent UTI,who presented to the emergency room with acute onset of chest pain as well as abdominal painand left arm swelling with mild pain without erythema and altered mental status. She has been having urinary frequency without dysuria or hematuria or flank pain. Patient is from [facility] [place].  1. Osseous lytic/sclerotic lesions  with unknown primary  -Pain management PRN oxycodone  -An oncology consultation with Dr. Tasia Catchings -- and will follow-up at the cancer center. Appointment will be made by Dr. Collie Siad office --Multiple myeloma vs metastatic leisons -- myeloma panel pending -- peripheral smear-- blast or any other abnormal cells noted -- elevated alkaline phosphatase due to bony lesions -- patient will need further workup as outpatient with whole body pet scan to determine etiology for the mixed skeletal lesions in the bone.  2. Abnormal Urinalysis and CT abdomen -patient received IV Rocephin however urine culture is negative. Will discontinue antibiotics -- urine culture no growth -cannot exclude urothelial  lesion within the proximal left ureter. 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 Diamantina Providence appreciated-- recommends CT urogram-- no acute changes noted. Patient will need outpatient follow-up for cystoscopy to complete hematuria workup. She may be a candidate for left nephrectomy tube placement depending on oncology workup as outpatient. This was discussed with the family by Dr. Diamantina Providence  3. Left upper extremity superficial venous thrombosis -Pain management will be provided with NSAIDs, heating pad  4. 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 Sammuel Hines at [facility]  5. Hypertension. -Continue NorvascandInderal.  6. Depression. -Wellbutrin SR will be continued.  7. COPD without exacerbation. -  cont as needed duo nebs.  8.  anemia of chronic disease -continue to monitor. No active bleeding. Will transfuse as needed -hgb 7.4 -start po oral iron -- since patient is going to be following up at the cancer center further management for her anemia can be taken care of by Dr Tasia Catchings  9. Ongoing tobacco abuse. -Patient advised smoking cessation.  10. DVT prophylaxis. -Subcutaneous Lovenox.   11. severe hypoalbuminemia/FTT/repeated admissions for different reasons/deconditioned -appears poorly nourished with significant deconditioning after early April hip fracture surgery. This is patient's third admission in April. -Palliative care consultation appreciated. Currently family and patient wants to be full code and continue aggressive management for multiple medical problems.  12. Hypercalcemia. Albumin level is 1.9. Corrected calcium is 11.5 -elevated LDH -PTH results pending -Pamidronate 60 mg x1 given initial decreased mental status/somnolence and cancer work in progress -mentation much  improved   Procedures: CT urogram Family communication : husband in the room. Daughter Holly Shelton on the phone Consults : oncology,  vascular surgery, palliative care CODE STATUS: full DVT Prophylaxis : Lovenox  Status is: observation She needs insurance authorization according to [facility] prior to return back to [facility] which will likely happen on Monday, April 26th  Dispo: The patient is from: Home  Anticipated d/c is to: SNF  Anticipated d/c date is: April 26th  Patient currently stable and best at baseline CONSULTS OBTAINED:  Treatment Team:  Earlie Server, MD Billey Co, MD  DRUG ALLERGIES:   Allergies  Allergen Reactions  . Penicillins Rash    Did it involve swelling of the face/tongue/throat, SOB, or low BP? No Did it involve sudden or severe rash/hives, skin peeling, or any reaction on the inside of your mouth or nose? Yes Did you need to seek medical attention at a hospital or doctor's office? No When did it last happen? If all above answers are "NO", may proceed with cephalosporin use.    DISCHARGE MEDICATIONS:   Allergies as of 05/29/2019      Reactions   Penicillins Rash   Did it involve swelling of the face/tongue/throat, SOB, or low BP? No Did it involve sudden or severe rash/hives, skin peeling, or any reaction on the inside of your mouth or nose? Yes Did you need to seek medical attention at a hospital or doctor's office? No When did it last happen? If all above answers are "NO", may proceed with cephalosporin use.      Medication List    STOP taking these medications   nicotine 14 mg/24hr patch Commonly known as: NICODERM CQ - dosed in mg/24 hours     TAKE these medications   albuterol 108 (90 Base) MCG/ACT inhaler Commonly known as: VENTOLIN HFA Inhale 2 puffs into the lungs every 6 (six) hours as needed.   alum & mag hydroxide-simeth 200-200-20 MG/5ML suspension Commonly known as:  MAALOX/MYLANTA Take 30 mLs by mouth every 4 (four) hours as needed for indigestion.   aspirin EC 81 MG tablet Take 81 mg by mouth at bedtime.   B-complex with vitamin C tablet Take 1 tablet by mouth daily.   bisacodyl 10 MG suppository Commonly known as: DULCOLAX Place 1 suppository (10 mg total) rectally daily as needed for moderate constipation.   Calcium 600-200 MG-UNIT tablet Take 1 tablet by mouth daily.   calcium acetate (Phos Binder) 667 MG/5ML Soln Commonly known as: PHOSLYRA Take 667 mg by mouth in the morning and at bedtime.   cetirizine 10 MG tablet Commonly known as: ZYRTEC Take 10 mg by mouth daily.   cholecalciferol 25 MCG (1000 UNIT) tablet Commonly known as: VITAMIN D3 Take 1,000 Units by mouth daily.   docusate sodium 100 MG capsule Commonly known as: COLACE Take 1 capsule (100 mg total) by mouth 2 (two) times daily.   enoxaparin 40 MG/0.4ML injection Commonly known as: LOVENOX Inject 0.4 mLs (40 mg total) into the skin daily for 14 days. What changed: additional instructions   feeding supplement (ENSURE ENLIVE) Liqd Take 237 mLs by mouth 2 (two) times daily between meals.   ferrous [id number] C-folic acid capsule Commonly known as: TRINSICON / FOLTRIN Take 1 capsule by mouth 2 (two) times daily.   midodrine 10 MG tablet Commonly known as: PROAMATINE Take 1 tablet (10 mg total) by mouth 3 (three) times daily with meals.   multivitamin with minerals Tabs tablet Take 1 tablet by mouth daily.   omeprazole 40 MG capsule Commonly known as: PRILOSEC Take 40 mg by mouth daily.   oxybutynin 5  MG tablet Commonly known as: DITROPAN Take 5 mg by mouth 3 (three) times daily.   oxyCODONE 5 MG immediate release tablet Commonly known as: Oxy IR/ROXICODONE Take 1 tablet (5 mg total) by mouth every 6 (six) hours as needed for severe pain or breakthrough pain.   polyethylene glycol 17 g packet Commonly known as: MIRALAX / GLYCOLAX Take 17 g by  mouth daily.   pregabalin 100 MG capsule Commonly known as: LYRICA Take 100 mg by mouth 3 (three) times daily.   rosuvastatin 10 MG tablet Commonly known as: CRESTOR Take 10 mg by mouth at bedtime.   Spiriva HandiHaler 18 MCG inhalation capsule Generic drug: tiotropium Place 1 capsule into inhaler and inhale daily.   Wixela Inhub 500-50 MCG/DOSE Aepb Generic drug: Fluticasone-Salmeterol Inhale 1 puff into the lungs 2 (two) times daily.       If you experience worsening of your admission symptoms, develop shortness of breath, life threatening emergency, suicidal or homicidal thoughts you must seek medical attention immediately by calling 911 or calling your MD immediately  if symptoms less severe.  You Must read complete instructions/literature along with all the possible adverse reactions/side effects for all the Medicines you take and that have been prescribed to you. Take any new Medicines after you have completely understood and accept all the possible adverse reactions/side effects.   Please note  You were cared for by a hospitalist during your hospital stay. If you have any questions about your discharge medications or the care you received while you were in the hospital after you are discharged, you can call the unit and asked to speak with the hospitalist on call if the hospitalist that took care of you is not available. Once you are discharged, your primary care physician will handle any further medical issues. Please note that NO REFILLS for any discharge medications will be authorized once you are discharged, as it is imperative that you return to your primary care physician (or establish a relationship with a primary care physician if you do not have one) for your aftercare needs so that they can reassess your need for medications and monitor your lab values. Today   SUBJECTIVE   No new complaints  VITAL SIGNS:  Blood pressure (!) 113/59, pulse 88, temperature 98.2 F  (36.8 C), resp. rate 16, height 5' (1.524 m), weight 78.2 kg, SpO2 100 %.  I/O:    Intake/Output Summary (Last 24 hours) at 05/29/2019 1409 Last data filed at 05/29/2019 1354 Gross per 24 hour  Intake 180 ml  Output 650 ml  Net -470 ml    PHYSICAL EXAMINATION:  GENERAL:  79 y.o.-year-old patient lying in the bed with no acute distress. Morbid obesity anasarca EYES: Pupils equal, round, reactive to light and accommodation. No scleral icterus.  HEENT: Head atraumatic, normocephalic. Oropharynx and nasopharynx clear.  NECK:  Supple, no jugular venous distention. No thyroid enlargement, no tenderness.  LUNGS: Normal breath sounds bilaterally, no wheezing, rales,rhonchi or crepitation. No use of accessory muscles of respiration.  CARDIOVASCULAR: S1, S2 normal. No murmurs, rubs, or gallops.  ABDOMEN: Soft, non-tender, non-distended. Bowel sounds present. No organomegaly or mass.  EXTREMITIES:  bilateral lower extremity chronic edema . left upper extremity edema NEUROLOGIC: Cranial nerves II through XII are intact. Muscle strength 5/5 in all extremities. Sensation intact. Gait not checked.  PSYCHIATRIC: The patient is alert and oriented x 3.  SKIN:  Pressure Injury 05/26/19 Buttocks Left Deep Tissue Pressure Injury - Purple or maroon localized area  of discolored intact skin or blood-filled blister due to damage of underlying soft tissue from pressure and/or shear. (Active)  05/26/19 0600  Location: Buttocks  Location Orientation: Left  Staging: Deep Tissue Pressure Injury - Purple or maroon localized area of discolored intact skin or blood-filled blister due to damage of underlying soft tissue from pressure and/or shear.  Wound Description (Comments):   Present on Admission: Yes    DATA REVIEW:   CBC  Recent Labs  Lab 05/26/19 0424  WBC 11.2*  HGB 7.4*  HCT 23.6*  PLT 334    Chemistries  Recent Labs  Lab 05/26/19 0424 05/26/19 0858  NA 138  --   K 3.4*  --   CL 98  --    CO2 33*  --   GLUCOSE 95  --   BUN 12  --   CREATININE 0.63  --   CALCIUM 9.8  --   AST  --  40  ALT  --  11  ALKPHOS  --  664*  BILITOT  --  0.9    Microbiology Results   Recent Results (from the past 240 hour(s))  Respiratory Panel by RT PCR (Flu A&B, Covid) - Nasopharyngeal Swab     Status: None   Collection Time: 05/22/19 10:38 AM   Specimen: Nasopharyngeal Swab  Result Value Ref Range Status   SARS Coronavirus 2 by RT PCR NEGATIVE NEGATIVE Final    Comment: (NOTE) SARS-CoV-2 target nucleic acids are NOT DETECTED. The SARS-CoV-2 RNA is generally detectable in upper respiratoy specimens during the acute phase of infection. The lowest concentration of SARS-CoV-2 viral copies this assay can detect is 131 copies/mL. A negative result does not preclude SARS-Cov-2 infection and should not be used as the sole basis for treatment or other patient management decisions. A negative result may occur with  improper specimen collection/handling, submission of specimen other than nasopharyngeal swab, presence of viral mutation(s) within the areas targeted by this assay, and inadequate number of viral copies (<131 copies/mL). A negative result must be combined with clinical observations, patient history, and epidemiological information. The expected result is Negative. Fact Sheet for Patients:  PinkCheek.be Fact Sheet for Healthcare Providers:  GravelBags.it This test is not yet ap proved or cleared by the [place] FDA and  has been authorized for detection and/or diagnosis of SARS-CoV-2 by FDA under an Emergency Use Authorization (EUA). This EUA will remain  in effect (meaning this test can be used) for the duration of the COVID-19 declaration under Section 564(b)(1) of the Act, 21 U.S.C. section 360bbb-3(b)(1), unless the authorization is terminated or revoked sooner.    Influenza A by PCR NEGATIVE NEGATIVE Final    Influenza B by PCR NEGATIVE NEGATIVE Final    Comment: (NOTE) The Xpert Xpress SARS-CoV-2/FLU/RSV assay is intended as an aid in  the diagnosis of influenza from Nasopharyngeal swab specimens and  should not be used as a sole basis for treatment. Nasal washings and  aspirates are unacceptable for Xpert Xpress SARS-CoV-2/FLU/RSV  testing. Fact Sheet for Patients: PinkCheek.be Fact Sheet for Healthcare Providers: GravelBags.it This test is not yet approved or cleared by the [place] FDA and  has been authorized for detection and/or diagnosis of SARS-CoV-2 by  FDA under an Emergency Use Authorization (EUA). This EUA will remain  in effect (meaning this test can be used) for the duration of the  Covid-19 declaration under Section 564(b)(1) of the Act, 21  U.S.C. section 360bbb-3(b)(1), unless the authorization is  terminated  or revoked. Performed at [facility], [address] [place], [place] [address]   Urine culture     Status: None   Collection Time: 05/26/19  2:58 AM   Specimen: Urine, Random  Result Value Ref Range Status   Specimen Description   Final    URINE, RANDOM Performed at [facility], [address] [place], [place] [address]    Special Requests   Final    NONE Performed at [facility], [address], [place], [place] [address]    Culture   Final    NO GROWTH Performed at [facility], [place] [address], [place], [place] [address]    Report Status 05/27/2019 FINAL  Final  Respiratory Panel by RT PCR (Flu A&B, Covid) - Nasopharyngeal Swab     Status: None   Collection Time: 05/26/19  4:24 AM   Specimen: Nasopharyngeal Swab  Result Value Ref Range Status   SARS Coronavirus 2 by RT PCR NEGATIVE NEGATIVE Final    Comment: (NOTE) SARS-CoV-2 target nucleic acids are NOT DETECTED. The SARS-CoV-2 RNA is generally detectable in upper respiratoy specimens during the  acute phase of infection. The lowest concentration of SARS-CoV-2 viral copies this assay can detect is 131 copies/mL. A negative result does not preclude SARS-Cov-2 infection and should not be used as the sole basis for treatment or other patient management decisions. A negative result may occur with  improper specimen collection/handling, submission of specimen other than nasopharyngeal swab, presence of viral mutation(s) within the areas targeted by this assay, and inadequate number of viral copies (<131 copies/mL). A negative result must be combined with clinical observations, patient history, and epidemiological information. The expected result is Negative. Fact Sheet for Patients:  PinkCheek.be Fact Sheet for Healthcare Providers:  GravelBags.it This test is not yet ap proved or cleared by the [place] FDA and  has been authorized for detection and/or diagnosis of SARS-CoV-2 by FDA under an Emergency Use Authorization (EUA). This EUA will remain  in effect (meaning this test can be used) for the duration of the COVID-19 declaration under Section 564(b)(1) of the Act, 21 U.S.C. section 360bbb-3(b)(1), unless the authorization is terminated or revoked sooner.    Influenza A by PCR NEGATIVE NEGATIVE Final   Influenza B by PCR NEGATIVE NEGATIVE Final    Comment: (NOTE) The Xpert Xpress SARS-CoV-2/FLU/RSV assay is intended as an aid in  the diagnosis of influenza from Nasopharyngeal swab specimens and  should not be used as a sole basis for treatment. Nasal washings and  aspirates are unacceptable for Xpert Xpress SARS-CoV-2/FLU/RSV  testing. Fact Sheet for Patients: PinkCheek.be Fact Sheet for Healthcare Providers: GravelBags.it This test is not yet approved or cleared by the [place] FDA and  has been authorized for detection and/or diagnosis of SARS-CoV-2  by  FDA under an Emergency Use Authorization (EUA). This EUA will remain  in effect (meaning this test can be used) for the duration of the  Covid-19 declaration under Section 564(b)(1) of the Act, 21  U.S.C. section 360bbb-3(b)(1), unless the authorization is  terminated or revoked. Performed at [facility], [address], [place], [place] [address]     RADIOLOGY:  No results found.   CODE STATUS:     Code Status Orders  (From admission, onward)         Start     Ordered   05/26/19 0403  Full code  Continuous     05/26/19 0405  Code Status History    Date Active Date Inactive Code Status Order ID Comments User Context   05/18/2019 1421 05/23/2019 1943 Full Code [id number]  Nolberto Hanlon, MD ED   05/06/2019 1957 05/11/2019 1901 Full Code [id number]  Athena Masse, MD ED   Advance Care Planning Activity       TOTAL TIME TAKING CARE OF THIS PATIENT: *40* minutes.    Fritzi Mandes M.D  [facility]    CC: Primary care physician; Patient, No Pcp Per

## 2019-05-30 ENCOUNTER — Other Ambulatory Visit: Payer: Self-pay

## 2019-05-30 DIAGNOSIS — M899 Disorder of bone, unspecified: Secondary | ICD-10-CM

## 2019-05-30 DIAGNOSIS — R4182 Altered mental status, unspecified: Secondary | ICD-10-CM | POA: Diagnosis not present

## 2019-05-30 DIAGNOSIS — N3000 Acute cystitis without hematuria: Secondary | ICD-10-CM | POA: Diagnosis not present

## 2019-05-30 LAB — MULTIPLE MYELOMA PANEL, SERUM
Albumin SerPl Elph-Mcnc: 1.9 g/dL — ABNORMAL LOW (ref 2.9–4.4)
Albumin/Glob SerPl: 0.7 (ref 0.7–1.7)
Alpha 1: 0.4 g/dL (ref 0.0–0.4)
Alpha2 Glob SerPl Elph-Mcnc: 0.8 g/dL (ref 0.4–1.0)
B-Globulin SerPl Elph-Mcnc: 0.9 g/dL (ref 0.7–1.3)
Gamma Glob SerPl Elph-Mcnc: 0.9 g/dL (ref 0.4–1.8)
Globulin, Total: 3 g/dL (ref 2.2–3.9)
IgA: 340 mg/dL (ref 64–422)
IgG (Immunoglobin G), Serum: 969 mg/dL (ref 586–1602)
IgM (Immunoglobulin M), Srm: 59 mg/dL (ref 26–217)
Total Protein ELP: 4.9 g/dL — ABNORMAL LOW (ref 6.0–8.5)

## 2019-05-30 LAB — PTH, INTACT AND CALCIUM
Calcium, Total (PTH): 10 mg/dL (ref 8.7–10.3)
PTH: 7 pg/mL — ABNORMAL LOW (ref 15–65)

## 2019-05-30 NOTE — Discharge Summary (Addendum)
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 Hypertension     HOSPITAL COURSE:   [name] a79 y.o.femalewith a known history of hypertension and thoracic aortic aneurysm,hypertension, ongoing tobacco abuse and COPD and recurrent UTI,who presented to the emergency room with acute onset of chest pain as well as abdominal painand left arm swelling with mild pain without erythema and altered mental status. She has been having urinary frequency without dysuria or hematuria or flank pain. Patient is from [facility]. Pt has no overnight issues. She has no complaints.   1. Osseous lytic/sclerotic lesions of unkonwn etiology -Pain management PRN oxycodone  -oncology consultation with Dr. Tasia Catchings -- and will follow-up at the cancer center. Appointment will be made by Dr. Collie Siad office --Multiple myeloma vs metastatic leisons -- myeloma panel pending -- Kappa/Lambda free light chains elevated (abnormal) -- peripheral smear-- blast or any other abnormal cells noted -- elevated alkaline phosphatase due to bony lesions -- patient will need further workup as outpatient with whole body pet scan to determine etiology for the mixed skeletal lesions in the bone.  2. Abnormal Urinalysis and CT abdomen -patient received  IV Rocephin however urine culture is negative. Will discontinue antibiotics -- urine culture no growth -cannot exclude urothelial lesion within the proximal left ureter. 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 Diamantina Providence appreciated-- recommends CT urogram-- no acute changes noted. Patient will need outpatient follow-up for cystoscopy to complete hematuria workup. She may be a candidate for left nephrectomy tube placement depending on oncology workup as outpatient. This was discussed with the family by Dr. Diamantina Providence -pt on oxybutynin  3. Left upper extremity superficial venous thrombosis -Pain management will be provided with prn NSAIDs, heating pad  4. 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 Sammuel Hines at [facility]  5. Relative Hypotension h/oHypertension. -pt's bp meds held due to soft BP (amlodipine, Inderal) -she is on midodrine  6. HL - cont crestor  7. COPD without exacerbation. -  cont as needed duo nebs. -oxygen 2 liters [place]  8. anemia of chronic disease -continue to monitor. No active bleeding. Will transfuse as needed -hgb 7.4 -start po oral iron -- since patient is going to be following up at the cancer center further management for her anemia can be taken care of by Dr Tasia Catchings  9. h/o tobacco abuse.  10. DVT prophylaxis. -Subcutaneous Lovenox. Pt completed her 14 days post hip fracture Lovenox DVT prophylaxis on 05/25/2019 per D/c summary on 4/8 -I am unsure why she was discharged on lovenox for another 14 days per  05/22/2019 d/c meds -pt has had adequate lovenox for her post hip sx and therefore will d/c it  11. severe hypoalbuminemia/FTT/repeated admissions for different reasons/deconditioned -appears poorly nourished with significant deconditioning after early April hip  fracture surgery.  This is patient's third admission in April. -Palliative care consultation appreciated. Currently family and patient wants to be full code and continue aggressive management for multiple medical problems.  12. Hypercalcemia. Albumin level is 1.9. Corrected calcium is 11.5 -elevated LDH -PTH results pending -Pamidronate 60 mg x1 given initial decreased mental status/somnolence and cancer work in progress -mentation much improved   Procedures: CT urogram Family communication : husband in the room. Daughter Levada Dy on the phone is aware of the plans Consults : oncology, vascular surgery, palliative care CODE STATUS: full DVT Prophylaxis : Lovenox  Status is: observation She needs insurance authorization according to [facility] prior to return back to [facility] which will likely happen today Repeat COVID neg  Dispo: The patient is from: Home  Anticipated d/c is to: SNF  Anticipated d/c date is: April 26th  Patient currently stable and best at baseline CONSULTS OBTAINED:  Treatment Team:  Earlie Server, MD Billey Co, MD  DRUG ALLERGIES:   Allergies  Allergen Reactions  . Penicillins Rash    Did it involve swelling of the face/tongue/throat, SOB, or low BP? No Did it involve sudden or severe rash/hives, skin peeling, or any reaction on the inside of your mouth or nose? Yes Did you need to seek medical attention at a hospital or doctor's office? No When did it last happen? If all above answers are "NO", may proceed with cephalosporin use.    DISCHARGE MEDICATIONS:   Allergies as of 05/30/2019      Reactions   Penicillins Rash   Did it involve swelling of the face/tongue/throat, SOB, or low BP? No Did it involve sudden or severe rash/hives, skin peeling, or any reaction on the inside of your mouth or nose? Yes Did you need to seek medical attention at a hospital or doctor's office? No When did it last happen? If all above  answers are "NO", may proceed with cephalosporin use.      Medication List    STOP taking these medications   nicotine 14 mg/24hr patch Commonly known as: NICODERM CQ - dosed in mg/24 hours     TAKE these medications   albuterol 108 (90 Base) MCG/ACT inhaler Commonly known as: VENTOLIN HFA Inhale 2 puffs into the lungs every 6 (six) hours as needed.   alum & mag hydroxide-simeth 200-200-20 MG/5ML suspension Commonly known as: MAALOX/MYLANTA Take 30 mLs by mouth every 4 (four) hours as needed for indigestion.   aspirin EC 81 MG tablet Take 81 mg by mouth at bedtime.   B-complex with vitamin C tablet Take 1 tablet by mouth daily.   bisacodyl 10 MG suppository Commonly known as: DULCOLAX Place 1 suppository (10 mg total) rectally daily as needed for moderate constipation.   Calcium 600-200 MG-UNIT tablet Take 1 tablet by mouth daily.   calcium acetate (Phos Binder) 667 MG/5ML Soln Commonly known as: PHOSLYRA Take 667 mg by mouth in the morning and at bedtime.   cetirizine 10 MG tablet Commonly known as: ZYRTEC Take 10 mg by mouth daily.   cholecalciferol 25 MCG (1000 UNIT) tablet Commonly known as: VITAMIN D3 Take 1,000 Units by mouth daily.   docusate sodium 100 MG capsule Commonly known as: COLACE Take 1 capsule (100 mg total) by mouth 2 (two) times daily.   enoxaparin 40 MG/0.4ML injection Commonly known as: LOVENOX Inject 0.4 mLs (40 mg total) into the skin daily for 14 days. What changed: additional instructions   feeding supplement (ENSURE ENLIVE) Liqd Take  237 mLs by mouth 2 (two) times daily between meals.   ferrous [id number]-W25-ENIDPOE C-folic acid capsule Commonly known as: TRINSICON / FOLTRIN Take 1 capsule by mouth 2 (two) times daily.   midodrine 10 MG tablet Commonly known as: PROAMATINE Take 1 tablet (10 mg total) by mouth 3 (three) times daily with meals.   multivitamin with minerals Tabs tablet Take 1 tablet by mouth daily.   omeprazole  40 MG capsule Commonly known as: PRILOSEC Take 40 mg by mouth daily.   oxybutynin 5 MG tablet Commonly known as: DITROPAN Take 5 mg by mouth 3 (three) times daily.   oxyCODONE 5 MG immediate release tablet Commonly known as: Oxy IR/ROXICODONE Take 1 tablet (5 mg total) by mouth every 6 (six) hours as needed for severe pain or breakthrough pain.   polyethylene glycol 17 g packet Commonly known as: MIRALAX / GLYCOLAX Take 17 g by mouth daily.   pregabalin 100 MG capsule Commonly known as: LYRICA Take 100 mg by mouth 3 (three) times daily.   rosuvastatin 10 MG tablet Commonly known as: CRESTOR Take 10 mg by mouth at bedtime.   Spiriva HandiHaler 18 MCG inhalation capsule Generic drug: tiotropium Place 1 capsule into inhaler and inhale daily.   Wixela Inhub 500-50 MCG/DOSE Aepb Generic drug: Fluticasone-Salmeterol Inhale 1 puff into the lungs 2 (two) times daily.       If you experience worsening of your admission symptoms, develop shortness of breath, life threatening emergency, suicidal or homicidal thoughts you must seek medical attention immediately by calling 911 or calling your MD immediately  if symptoms less severe.  You Must read complete instructions/literature along with all the possible adverse reactions/side effects for all the Medicines you take and that have been prescribed to you. Take any new Medicines after you have completely understood and accept all the possible adverse reactions/side effects.   Please note  You were cared for by a hospitalist during your hospital stay. If you have any questions about your discharge medications or the care you received while you were in the hospital after you are discharged, you can call the unit and asked to speak with the hospitalist on call if the hospitalist that took care of you is not available. Once you are discharged, your primary care physician will handle any further medical issues. Please note that NO REFILLS for  any discharge medications will be authorized once you are discharged, as it is imperative that you return to your primary care physician (or establish a relationship with a primary care physician if you do not have one) for your aftercare needs so that they can reassess your need for medications and monitor your lab values.   SUBJECTIVE   No new complaints  VITAL SIGNS:  Blood pressure 106/66, pulse 97, temperature 98.7 F (37.1 C), temperature source Oral, resp. rate 18, height 5' (1.524 m), weight 78.2 kg, SpO2 100 %.  I/O:    Intake/Output Summary (Last 24 hours) at 05/30/2019 0837 Last data filed at 05/30/2019 0049 Gross per 24 hour  Intake 160 ml  Output 450 ml  Net -290 ml    PHYSICAL EXAMINATION:  GENERAL:  79 y.o.-year-old patient lying in the bed with no acute distress. Morbid obesity anasarca EYES: Anicteric sclera HEENT: Head atraumatic, normocephalic. Oropharynx and nasopharynx clear.  NECK:  Supple, no jugular venous distention. No thyroid enlargement, no tenderness.  LUNGS: Normal breath sounds bilaterally, no wheezing, rales,rhonchi or crepitation. No use of accessory muscles of respiration.  CARDIOVASCULAR:  S1, S2 normal. No murmurs, rubs, or gallops.  ABDOMEN: Soft, non-tender, non-distended. Bowel sounds present. No organomegaly or mass.  EXTREMITIES:  bilateral lower extremity chronic edema . left upper extremity edema NEUROLOGIC: Cranial nerves II through XII are intact. Muscle strength 4/5 in all extremities. Sensation intact. Gait not checked. Weak, deconditoned PSYCHIATRIC: The patient is alert and oriented x 3.  SKIN: warm , dry Pressure Injury 05/26/19 Buttocks Left Deep Tissue Pressure Injury - Purple or maroon localized area of discolored intact skin or blood-filled blister due to damage of underlying soft tissue from pressure and/or shear. (Active)  05/26/19 0600  Location: Buttocks  Location Orientation: Left  Staging: Deep Tissue Pressure Injury -  Purple or maroon localized area of discolored intact skin or blood-filled blister due to damage of underlying soft tissue from pressure and/or shear.  Wound Description (Comments):   Present on Admission: Yes    DATA REVIEW:   CBC  Recent Labs  Lab 05/26/19 0424  WBC 11.2*  HGB 7.4*  HCT 23.6*  PLT 334    Chemistries  Recent Labs  Lab 05/26/19 0424 05/26/19 0858 05/27/19 0708  NA 138  --   --   K 3.4*  --   --   CL 98  --   --   CO2 33*  --   --   GLUCOSE 95  --   --   BUN 12  --   --   CREATININE 0.63  --   --   CALCIUM 9.8  --  10.0  AST  --  40  --   ALT  --  11  --   ALKPHOS  --  664*  --   BILITOT  --  0.9  --     Microbiology Results   Recent Results (from the past 240 hour(s))  Respiratory Panel by RT PCR (Flu A&B, Covid) - Nasopharyngeal Swab     Status: None   Collection Time: 05/22/19 10:38 AM   Specimen: Nasopharyngeal Swab  Result Value Ref Range Status   SARS Coronavirus 2 by RT PCR NEGATIVE NEGATIVE Final    Comment: (NOTE) SARS-CoV-2 target nucleic acids are NOT DETECTED. The SARS-CoV-2 RNA is generally detectable in upper respiratoy specimens during the acute phase of infection. The lowest concentration of SARS-CoV-2 viral copies this assay can detect is 131 copies/mL. A negative result does not preclude SARS-Cov-2 infection and should not be used as the sole basis for treatment or other patient management decisions. A negative result may occur with  improper specimen collection/handling, submission of specimen other than nasopharyngeal swab, presence of viral mutation(s) within the areas targeted by this assay, and inadequate number of viral copies (<131 copies/mL). A negative result must be combined with clinical observations, patient history, and epidemiological information. The expected result is Negative. Fact Sheet for Patients:  PinkCheek.be Fact Sheet for Healthcare Providers:   GravelBags.it This test is not yet ap proved or cleared by the [place] FDA and  has been authorized for detection and/or diagnosis of SARS-CoV-2 by FDA under an Emergency Use Authorization (EUA). This EUA will remain  in effect (meaning this test can be used) for the duration of the COVID-19 declaration under Section 564(b)(1) of the Act, 21 U.S.C. section 360bbb-3(b)(1), unless the authorization is terminated or revoked sooner.    Influenza A by PCR NEGATIVE NEGATIVE Final   Influenza B by PCR NEGATIVE NEGATIVE Final    Comment: (NOTE) The Xpert Xpress SARS-CoV-2/FLU/RSV assay is intended as  an aid in  the diagnosis of influenza from Nasopharyngeal swab specimens and  should not be used as a sole basis for treatment. Nasal washings and  aspirates are unacceptable for Xpert Xpress SARS-CoV-2/FLU/RSV  testing. Fact Sheet for Patients: PinkCheek.be Fact Sheet for Healthcare Providers: GravelBags.it This test is not yet approved or cleared by the [place] FDA and  has been authorized for detection and/or diagnosis of SARS-CoV-2 by  FDA under an Emergency Use Authorization (EUA). This EUA will remain  in effect (meaning this test can be used) for the duration of the  Covid-19 declaration under Section 564(b)(1) of the Act, 21  U.S.C. section 360bbb-3(b)(1), unless the authorization is  terminated or revoked. Performed at [facility], [address], [place], [place] [address]   Urine culture     Status: None   Collection Time: 05/26/19  2:58 AM   Specimen: Urine, Random  Result Value Ref Range Status   Specimen Description   Final    URINE, RANDOM Performed at [facility], [address], [place], [place] [address]    Special Requests   Final    NONE Performed at [facility], [address], [place], [place] [address]    Culture   Final    NO  GROWTH Performed at [facility], [place] [address] [place], [place] [address]    Report Status 05/27/2019 FINAL  Final  Respiratory Panel by RT PCR (Flu A&B, Covid) - Nasopharyngeal Swab     Status: None   Collection Time: 05/26/19  4:24 AM   Specimen: Nasopharyngeal Swab  Result Value Ref Range Status   SARS Coronavirus 2 by RT PCR NEGATIVE NEGATIVE Final    Comment: (NOTE) SARS-CoV-2 target nucleic acids are NOT DETECTED. The SARS-CoV-2 RNA is generally detectable in upper respiratoy specimens during the acute phase of infection. The lowest concentration of SARS-CoV-2 viral copies this assay can detect is 131 copies/mL. A negative result does not preclude SARS-Cov-2 infection and should not be used as the sole basis for treatment or other patient management decisions. A negative result may occur with  improper specimen collection/handling, submission of specimen other than nasopharyngeal swab, presence of viral mutation(s) within the areas targeted by this assay, and inadequate number of viral copies (<131 copies/mL). A negative result must be combined with clinical observations, patient history, and epidemiological information. The expected result is Negative. Fact Sheet for Patients:  PinkCheek.be Fact Sheet for Healthcare Providers:  GravelBags.it This test is not yet ap proved or cleared by the [place] FDA and  has been authorized for detection and/or diagnosis of SARS-CoV-2 by FDA under an Emergency Use Authorization (EUA). This EUA will remain  in effect (meaning this test can be used) for the duration of the COVID-19 declaration under Section 564(b)(1) of the Act, 21 U.S.C. section 360bbb-3(b)(1), unless the authorization is terminated or revoked sooner.    Influenza A by PCR NEGATIVE NEGATIVE Final   Influenza B by PCR NEGATIVE NEGATIVE Final    Comment: (NOTE) The Xpert Xpress SARS-CoV-2/FLU/RSV assay  is intended as an aid in  the diagnosis of influenza from Nasopharyngeal swab specimens and  should not be used as a sole basis for treatment. Nasal washings and  aspirates are unacceptable for Xpert Xpress SARS-CoV-2/FLU/RSV  testing. Fact Sheet for Patients: PinkCheek.be Fact Sheet for Healthcare Providers: GravelBags.it This test is not yet approved or cleared by the [place] FDA and  has been authorized for detection and/or  diagnosis of SARS-CoV-2 by  FDA under an Emergency Use Authorization (EUA). This EUA will remain  in effect (meaning this test can be used) for the duration of the  Covid-19 declaration under Section 564(b)(1) of the Act, 21  U.S.C. section 360bbb-3(b)(1), unless the authorization is  terminated or revoked. Performed at [facility], [address] [place], [place] [address]   Respiratory Panel by RT PCR (Flu A&B, Covid) - Nasopharyngeal Swab     Status: None   Collection Time: 05/29/19 10:06 AM   Specimen: Nasopharyngeal Swab  Result Value Ref Range Status   SARS Coronavirus 2 by RT PCR NEGATIVE NEGATIVE Final    Comment: (NOTE) SARS-CoV-2 target nucleic acids are NOT DETECTED. The SARS-CoV-2 RNA is generally detectable in upper respiratoy specimens during the acute phase of infection. The lowest concentration of SARS-CoV-2 viral copies this assay can detect is 131 copies/mL. A negative result does not preclude SARS-Cov-2 infection and should not be used as the sole basis for treatment or other patient management decisions. A negative result may occur with  improper specimen collection/handling, submission of specimen other than nasopharyngeal swab, presence of viral mutation(s) within the areas targeted by this assay, and inadequate number of viral copies (<131 copies/mL). A negative result must be combined with clinical observations, patient history, and epidemiological information.  The expected result is Negative. Fact Sheet for Patients:  PinkCheek.be Fact Sheet for Healthcare Providers:  GravelBags.it This test is not yet ap proved or cleared by the [place] FDA and  has been authorized for detection and/or diagnosis of SARS-CoV-2 by FDA under an Emergency Use Authorization (EUA). This EUA will remain  in effect (meaning this test can be used) for the duration of the COVID-19 declaration under Section 564(b)(1) of the Act, 21 U.S.C. section 360bbb-3(b)(1), unless the authorization is terminated or revoked sooner.    Influenza A by PCR NEGATIVE NEGATIVE Final   Influenza B by PCR NEGATIVE NEGATIVE Final    Comment: (NOTE) The Xpert Xpress SARS-CoV-2/FLU/RSV assay is intended as an aid in  the diagnosis of influenza from Nasopharyngeal swab specimens and  should not be used as a sole basis for treatment. Nasal washings and  aspirates are unacceptable for Xpert Xpress SARS-CoV-2/FLU/RSV  testing. Fact Sheet for Patients: PinkCheek.be Fact Sheet for Healthcare Providers: GravelBags.it This test is not yet approved or cleared by the [place] FDA and  has been authorized for detection and/or diagnosis of SARS-CoV-2 by  FDA under an Emergency Use Authorization (EUA). This EUA will remain  in effect (meaning this test can be used) for the duration of the  Covid-19 declaration under Section 564(b)(1) of the Act, 21  U.S.C. section 360bbb-3(b)(1), unless the authorization is  terminated or revoked. Performed at [facility], [address] [place], [place] [address]     RADIOLOGY:  No results found.   CODE STATUS:     Code Status Orders  (From admission, onward)         Start     Ordered   05/26/19 0403  Full code  Continuous     05/26/19 0405        Code Status History    Date Active Date Inactive Code Status  Order ID Comments User Context   05/18/2019 1421 05/23/2019 1943 Full Code [id number]  Nolberto Hanlon, MD ED   05/06/2019 1957 05/11/2019 1901 Full Code [id number]  Athena Masse, MD ED   Advance Care Planning Activity  TOTAL TIME TAKING CARE OF THIS PATIENT: *40* minutes.    Fritzi Mandes M.D  [facility]    CC: Primary care physician; Patient, No Pcp Per

## 2019-05-30 NOTE — TOC Progression Note (Addendum)
Transition of Care [facility]) - Progression Note    Patient Details  Name: Holly Shelton MRN: [id number] Date of Birth: [birth year]  [facility]) CM/SW Contact  Dream Harman, Lemar Livings, LCSW Phone Number: 05/30/2019, 9:22 AM  Clinical Narrative:   Still waiting to hear if insurance auth from [place] from [employer]. Hopefully will hear something soon.  3:20 pm Still no word from insurance-per [place] have asked her to call a supervisor at [employer]   4:18 PM Have talked with supervisor and awaiting response regarding auth-probably happen tomorrow, according to [employer].     Expected Discharge Plan and Services           Expected Discharge Date: 05/29/19                                     Social Determinants of Health (SDOH) Interventions    Readmission Risk Interventions No flowsheet data found.

## 2019-05-31 LAB — GLUCOSE, CAPILLARY: Glucose-Capillary: 105 mg/dL — ABNORMAL HIGH (ref 70–99)

## 2019-05-31 NOTE — Progress Notes (Signed)
Report given to Admissions Nurse at facility.  Hip incision CDI, with steri strips in place.  Hip noted to be slightly warm to touch, as well as generalized redness. Patient denies pain. IV access removed with no issues noted. Patient agreeable to discharge (transfer to facility). Husband at bedside

## 2019-05-31 NOTE — TOC Transition Note (Signed)
Transition of Care [facility]) - CM/SW Discharge Note   Patient Details  Name: Holly Shelton MRN: [id number] Date of Birth: 10/07/1940  [facility] Care [facility]) CM/SW Contact:  Lucy Chris, LCSW Phone Number: 05/31/2019, 11:12 AM   Clinical Narrative:   Finally have received insurance auth to return to [employer]. Husband is here at the bedside and glad insurance has come through. Bedside RN to call report to [phone]. DC packet in chart. Will call [facility] when ready.    Final next level of care: Skilled Nursing Facility Barriers to Discharge: Insurance Authorization   Patient Goals and CMS Choice        Discharge Placement   Existing PASRR number confirmed : 05/27/19          Patient chooses bed at: [facility] Patient to be transferred to facility by: EMS Name of family member notified: [place]-husband Patient and family notified of of transfer: 05/31/19  Discharge Plan and Services                                     Social Determinants of Health (SDOH) Interventions     Readmission Risk Interventions No flowsheet data found.

## 2019-06-01 ENCOUNTER — Other Ambulatory Visit: Payer: Self-pay | Admitting: Hospice and Palliative Medicine

## 2019-06-01 DIAGNOSIS — M899 Disorder of bone, unspecified: Secondary | ICD-10-CM

## 2019-06-01 NOTE — Progress Notes (Signed)
Ambulatory referral to palliative care at [facility]

## 2019-06-02 ENCOUNTER — Telehealth: Payer: Self-pay | Admitting: *Deleted

## 2019-06-02 NOTE — Telephone Encounter (Addendum)
PET scan was scheduled as requested per Dr. Cathie Hoops.  Called to make pt aware: Per pt husband Gennie Alma he stated that she was in rehab for the next 2 to 3 weeks. A request was sent to call facility to let them know about the scheduled visit and see if they can provide transportation.  Kelly-DON @ [facility] stated that she doesn't even think pt will even be able to sit up but they would try to see about getting her there on 5/6. She was given PET scan appt location, date and time

## 2019-06-05 ENCOUNTER — Telehealth: Payer: Self-pay | Admitting: *Deleted

## 2019-06-05 ENCOUNTER — Inpatient Hospital Stay: Payer: Medicare PPO | Admitting: Oncology

## 2019-06-05 NOTE — Telephone Encounter (Signed)
[facility] called to report that patient passed away last night. She has an upcoming appointment this week for PET scan and appointment to see physician next week.

## 2019-06-09 LAB — PTH-RELATED PEPTIDE: PTH-related peptide: <2 pmol/L

## 2019-06-12 ENCOUNTER — Other Ambulatory Visit: Payer: Self-pay | Admitting: Urology

## 2019-06-20 ENCOUNTER — Inpatient Hospital Stay: Payer: Medicare PPO | Admitting: Oncology

## 2019-07-04 DEATH — deceased
